# Patient Record
Sex: Female | Born: 1960 | Race: White | Hispanic: No | Smoking: Former smoker
Health system: Southern US, Community
[De-identification: ages and names within clinical notes are randomized; demographics above are authoritative.]

## PROBLEM LIST (undated history)

## (undated) DIAGNOSIS — M542 Cervicalgia: Secondary | ICD-10-CM

## (undated) DIAGNOSIS — M549 Dorsalgia, unspecified: Secondary | ICD-10-CM

## (undated) DIAGNOSIS — G4730 Sleep apnea, unspecified: Secondary | ICD-10-CM

## (undated) DIAGNOSIS — G8929 Other chronic pain: Secondary | ICD-10-CM

## (undated) DIAGNOSIS — E785 Hyperlipidemia, unspecified: Secondary | ICD-10-CM

## (undated) DIAGNOSIS — J309 Allergic rhinitis, unspecified: Secondary | ICD-10-CM

## (undated) DIAGNOSIS — R87629 Unspecified abnormal cytological findings in specimens from vagina: Secondary | ICD-10-CM

## (undated) DIAGNOSIS — I10 Essential (primary) hypertension: Secondary | ICD-10-CM

## (undated) DIAGNOSIS — J439 Emphysema, unspecified: Secondary | ICD-10-CM

## (undated) DIAGNOSIS — J449 Chronic obstructive pulmonary disease, unspecified: Secondary | ICD-10-CM

## (undated) DIAGNOSIS — R519 Headache, unspecified: Secondary | ICD-10-CM

## (undated) DIAGNOSIS — R51 Headache: Secondary | ICD-10-CM

## (undated) HISTORY — DX: Other chronic pain: G89.29

## (undated) HISTORY — DX: Chronic obstructive pulmonary disease, unspecified: J44.9

## (undated) HISTORY — DX: Hyperlipidemia, unspecified: E78.5

## (undated) HISTORY — DX: Emphysema, unspecified: J43.9

## (undated) HISTORY — DX: Allergic rhinitis, unspecified: J30.9

## (undated) HISTORY — DX: Essential (primary) hypertension: I10

## (undated) HISTORY — DX: Sleep apnea, unspecified: G47.30

## (undated) HISTORY — DX: Unspecified abnormal cytological findings in specimens from vagina: R87.629

## (undated) HISTORY — DX: Cervicalgia: M54.2

## (undated) HISTORY — PX: TONSILLECTOMY: SUR1361

## (undated) HISTORY — DX: Headache: R51

## (undated) HISTORY — PX: NECK SURGERY: SHX720

## (undated) HISTORY — DX: Headache, unspecified: R51.9

## (undated) HISTORY — PX: GYNECOLOGIC CRYOSURGERY: SHX857

---

## 2014-04-03 ENCOUNTER — Emergency Department (HOSPITAL_COMMUNITY)
Admission: EM | Admit: 2014-04-03 | Discharge: 2014-04-03 | Disposition: A | Discharge status: Home / Self Care | Payer: Worker's Compensation | Attending: Emergency Medicine | Admitting: Emergency Medicine

## 2014-04-03 ENCOUNTER — Encounter (HOSPITAL_COMMUNITY): Payer: Self-pay | Admitting: Emergency Medicine

## 2014-04-03 ENCOUNTER — Emergency Department (HOSPITAL_COMMUNITY): Payer: Worker's Compensation

## 2014-04-03 DIAGNOSIS — T2111XA Burn of first degree of chest wall, initial encounter: Secondary | ICD-10-CM | POA: Insufficient documentation

## 2014-04-03 DIAGNOSIS — Z880 Allergy status to penicillin: Secondary | ICD-10-CM | POA: Insufficient documentation

## 2014-04-03 DIAGNOSIS — Y93G3 Activity, cooking and baking: Secondary | ICD-10-CM | POA: Insufficient documentation

## 2014-04-03 DIAGNOSIS — Y990 Civilian activity done for income or pay: Secondary | ICD-10-CM | POA: Insufficient documentation

## 2014-04-03 DIAGNOSIS — X16XXXA Contact with hot heating appliances, radiators and pipes, initial encounter: Secondary | ICD-10-CM | POA: Diagnosis not present

## 2014-04-03 DIAGNOSIS — Z720 Tobacco use: Secondary | ICD-10-CM | POA: Diagnosis not present

## 2014-04-03 DIAGNOSIS — S0501XA Injury of conjunctiva and corneal abrasion without foreign body, right eye, initial encounter: Secondary | ICD-10-CM | POA: Insufficient documentation

## 2014-04-03 DIAGNOSIS — Y9289 Other specified places as the place of occurrence of the external cause: Secondary | ICD-10-CM | POA: Insufficient documentation

## 2014-04-03 DIAGNOSIS — T300 Burn of unspecified body region, unspecified degree: Secondary | ICD-10-CM

## 2014-04-03 DIAGNOSIS — Z23 Encounter for immunization: Secondary | ICD-10-CM | POA: Insufficient documentation

## 2014-04-03 DIAGNOSIS — T22131A Burn of first degree of right upper arm, initial encounter: Secondary | ICD-10-CM | POA: Diagnosis not present

## 2014-04-03 DIAGNOSIS — T2019XA Burn of first degree of multiple sites of head, face, and neck, initial encounter: Secondary | ICD-10-CM | POA: Diagnosis present

## 2014-04-03 DIAGNOSIS — T2017XA Burn of first degree of neck, initial encounter: Secondary | ICD-10-CM | POA: Insufficient documentation

## 2014-04-03 DIAGNOSIS — S0502XA Injury of conjunctiva and corneal abrasion without foreign body, left eye, initial encounter: Secondary | ICD-10-CM | POA: Insufficient documentation

## 2014-04-03 DIAGNOSIS — T2010XA Burn of first degree of head, face, and neck, unspecified site, initial encounter: Secondary | ICD-10-CM

## 2014-04-03 HISTORY — DX: Dorsalgia, unspecified: M54.9

## 2014-04-03 MED ORDER — FLUORESCEIN SODIUM 1 MG OP STRP
ORAL_STRIP | OPHTHALMIC | Status: AC
  Administered 2014-04-03: 1 via OPHTHALMIC
  Filled 2014-04-03: qty 1

## 2014-04-03 MED ORDER — OXYCODONE-ACETAMINOPHEN 5-325 MG PO TABS: 2.0000 | ORAL_TABLET | ORAL | Status: DC | PRN

## 2014-04-03 MED ORDER — TETRACAINE HCL 0.5 % OP SOLN
2.0000 [drp] | Freq: Once | OPHTHALMIC | Status: AC
  Administered 2014-04-03: 2 [drp] via OPHTHALMIC

## 2014-04-03 MED ORDER — SILVER SULFADIAZINE 1 % EX CREA: 1.0000 "application " | TOPICAL_CREAM | Freq: Every day | CUTANEOUS | Status: DC

## 2014-04-03 MED ORDER — POLYMYXIN B-TRIMETHOPRIM 10000-0.1 UNIT/ML-% OP SOLN: 1.0000 [drp] | OPHTHALMIC | Status: AC

## 2014-04-03 MED ORDER — KETOROLAC TROMETHAMINE 0.5 % OP SOLN: 1.0000 [drp] | Freq: Four times a day (QID) | OPHTHALMIC | Status: DC

## 2014-04-03 MED ORDER — FLUORESCEIN SODIUM 1 MG OP STRP
1.0000 | ORAL_STRIP | Freq: Once | OPHTHALMIC | Status: AC
  Administered 2014-04-03: 1 via OPHTHALMIC

## 2014-04-03 MED ORDER — TETRACAINE HCL 0.5 % OP SOLN
OPHTHALMIC | Status: AC
  Administered 2014-04-03: 2 [drp] via OPHTHALMIC
  Filled 2014-04-03: qty 2

## 2014-04-03 MED ORDER — HYDROMORPHONE HCL 1 MG/ML IJ SOLN
1.0000 mg | Freq: Once | INTRAMUSCULAR | Status: AC
  Administered 2014-04-03: 1 mg via INTRAMUSCULAR
  Filled 2014-04-03: qty 1

## 2014-04-03 MED ORDER — TETANUS-DIPHTH-ACELL PERTUSSIS 5-2.5-18.5 LF-MCG/0.5 IM SUSP
0.5000 mL | Freq: Once | INTRAMUSCULAR | Status: AC
  Administered 2014-04-03: 0.5 mL via INTRAMUSCULAR
  Filled 2014-04-03: qty 0.5

## 2014-04-03 NOTE — Discharge Instructions (Signed)
Burn Care Take percocet instead of vicodin.  Do not take both.  Follow up with Dr. Iona Hansen. Return to the ED if you develop new or worsening symptoms. Your skin is a natural barrier to infection. It is the largest organ of your body. Burns damage this natural protection. To help prevent infection, it is very important to follow your caregiver's instructions in the care of your burn. Burns are classified as:  First degree. There is only redness of the skin (erythema). No scarring is expected.  Second degree. There is blistering of the skin. Scarring may occur with deeper burns.  Third degree. All layers of the skin are injured, and scarring is expected. HOME CARE INSTRUCTIONS   Wash your hands well before changing your bandage.  Change your bandage as often as directed by your caregiver.  Remove the old bandage. If the bandage sticks, you may soak it off with cool, clean water.  Cleanse the burn thoroughly but gently with mild soap and water.  Pat the area dry with a clean, dry cloth.  Apply a thin layer of antibacterial cream to the burn.  Apply a clean bandage as instructed by your caregiver.  Keep the bandage as clean and dry as possible.  Elevate the affected area for the first 24 hours, then as instructed by your caregiver.  Only take over-the-counter or prescription medicines for pain, discomfort, or fever as directed by your caregiver. SEEK IMMEDIATE MEDICAL CARE IF:   You develop excessive pain.  You develop redness, tenderness, swelling, or red streaks near the burn.  The burned area develops yellowish-white fluid (pus) or a bad smell.  You have a fever. MAKE SURE YOU:   Understand these instructions.  Will watch your condition.  Will get help right away if you are not doing well or get worse. Document Released: 05/15/2005 Document Revised: 08/07/2011 Document Reviewed: 10/05/2010 [facility] Patient Information 2015 [place], [place]. This information is not  intended to replace advice given to you by your health care provider. Make sure you discuss any questions you have with your health care provider.

## 2014-04-03 NOTE — ED Notes (Signed)
MD at bedside. 

## 2014-04-03 NOTE — ED Notes (Signed)
Patient with no complaints at this time. Respirations even and unlabored. Skin warm/dry. Discharge instructions reviewed with patient at this time. Patient given opportunity to voice concerns/ask questions. Patient discharged at this time and left Emergency Department with steady gait.   
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
## 2015-01-07 HISTORY — PX: COLONOSCOPY: SHX174

## 2015-06-09 DIAGNOSIS — J0100 Acute maxillary sinusitis, unspecified: Secondary | ICD-10-CM | POA: Diagnosis not present

## 2015-06-12 DIAGNOSIS — G4731 Primary central sleep apnea: Secondary | ICD-10-CM | POA: Diagnosis not present

## 2015-06-14 DIAGNOSIS — G4731 Primary central sleep apnea: Secondary | ICD-10-CM | POA: Diagnosis not present

## 2015-06-14 DIAGNOSIS — R69 Illness, unspecified: Secondary | ICD-10-CM | POA: Diagnosis not present

## 2015-07-05 DIAGNOSIS — J329 Chronic sinusitis, unspecified: Secondary | ICD-10-CM | POA: Diagnosis not present

## 2015-07-13 DIAGNOSIS — G4731 Primary central sleep apnea: Secondary | ICD-10-CM | POA: Diagnosis not present

## 2015-08-03 DIAGNOSIS — J329 Chronic sinusitis, unspecified: Secondary | ICD-10-CM | POA: Diagnosis not present

## 2015-08-03 DIAGNOSIS — J328 Other chronic sinusitis: Secondary | ICD-10-CM | POA: Diagnosis not present

## 2015-08-10 DIAGNOSIS — G4731 Primary central sleep apnea: Secondary | ICD-10-CM | POA: Diagnosis not present

## 2015-08-16 DIAGNOSIS — G4489 Other headache syndrome: Secondary | ICD-10-CM | POA: Diagnosis not present

## 2015-08-19 DIAGNOSIS — G44209 Tension-type headache, unspecified, not intractable: Secondary | ICD-10-CM | POA: Diagnosis not present

## 2015-08-24 DIAGNOSIS — R69 Illness, unspecified: Secondary | ICD-10-CM | POA: Diagnosis not present

## 2015-08-24 DIAGNOSIS — G4731 Primary central sleep apnea: Secondary | ICD-10-CM | POA: Diagnosis not present

## 2015-08-24 DIAGNOSIS — G4710 Hypersomnia, unspecified: Secondary | ICD-10-CM | POA: Diagnosis not present

## 2015-09-02 DIAGNOSIS — M25512 Pain in left shoulder: Secondary | ICD-10-CM | POA: Diagnosis not present

## 2015-09-10 DIAGNOSIS — G4731 Primary central sleep apnea: Secondary | ICD-10-CM | POA: Diagnosis not present

## 2015-09-15 DIAGNOSIS — R030 Elevated blood-pressure reading, without diagnosis of hypertension: Secondary | ICD-10-CM | POA: Diagnosis not present

## 2015-09-15 DIAGNOSIS — G8929 Other chronic pain: Secondary | ICD-10-CM | POA: Diagnosis not present

## 2015-09-15 DIAGNOSIS — Z0000 Encounter for general adult medical examination without abnormal findings: Secondary | ICD-10-CM | POA: Diagnosis not present

## 2015-09-15 DIAGNOSIS — G4731 Primary central sleep apnea: Secondary | ICD-10-CM | POA: Diagnosis not present

## 2015-09-15 DIAGNOSIS — H02849 Edema of unspecified eye, unspecified eyelid: Secondary | ICD-10-CM | POA: Diagnosis not present

## 2015-09-15 DIAGNOSIS — M25512 Pain in left shoulder: Secondary | ICD-10-CM | POA: Diagnosis not present

## 2015-09-15 DIAGNOSIS — R942 Abnormal results of pulmonary function studies: Secondary | ICD-10-CM | POA: Diagnosis not present

## 2015-09-15 DIAGNOSIS — R69 Illness, unspecified: Secondary | ICD-10-CM | POA: Diagnosis not present

## 2015-09-27 DIAGNOSIS — M25512 Pain in left shoulder: Secondary | ICD-10-CM | POA: Diagnosis not present

## 2015-09-29 DIAGNOSIS — G4731 Primary central sleep apnea: Secondary | ICD-10-CM | POA: Diagnosis not present

## 2015-09-29 DIAGNOSIS — G4733 Obstructive sleep apnea (adult) (pediatric): Secondary | ICD-10-CM | POA: Diagnosis not present

## 2015-10-10 DIAGNOSIS — G4731 Primary central sleep apnea: Secondary | ICD-10-CM | POA: Diagnosis not present

## 2015-11-10 DIAGNOSIS — G4731 Primary central sleep apnea: Secondary | ICD-10-CM | POA: Diagnosis not present

## 2015-12-08 DIAGNOSIS — M5489 Other dorsalgia: Secondary | ICD-10-CM | POA: Diagnosis not present

## 2015-12-08 DIAGNOSIS — M961 Postlaminectomy syndrome, not elsewhere classified: Secondary | ICD-10-CM | POA: Diagnosis not present

## 2015-12-08 DIAGNOSIS — G894 Chronic pain syndrome: Secondary | ICD-10-CM | POA: Diagnosis not present

## 2015-12-08 DIAGNOSIS — M542 Cervicalgia: Secondary | ICD-10-CM | POA: Diagnosis not present

## 2015-12-10 DIAGNOSIS — G4731 Primary central sleep apnea: Secondary | ICD-10-CM | POA: Diagnosis not present

## 2015-12-15 DIAGNOSIS — M50221 Other cervical disc displacement at C4-C5 level: Secondary | ICD-10-CM | POA: Diagnosis not present

## 2015-12-15 DIAGNOSIS — M9971 Connective tissue and disc stenosis of intervertebral foramina of cervical region: Secondary | ICD-10-CM | POA: Diagnosis not present

## 2015-12-15 DIAGNOSIS — M5124 Other intervertebral disc displacement, thoracic region: Secondary | ICD-10-CM | POA: Diagnosis not present

## 2015-12-15 DIAGNOSIS — M47814 Spondylosis without myelopathy or radiculopathy, thoracic region: Secondary | ICD-10-CM | POA: Diagnosis not present

## 2015-12-15 DIAGNOSIS — M47812 Spondylosis without myelopathy or radiculopathy, cervical region: Secondary | ICD-10-CM | POA: Diagnosis not present

## 2015-12-15 DIAGNOSIS — Z981 Arthrodesis status: Secondary | ICD-10-CM | POA: Diagnosis not present

## 2015-12-23 DIAGNOSIS — M961 Postlaminectomy syndrome, not elsewhere classified: Secondary | ICD-10-CM | POA: Diagnosis not present

## 2015-12-23 DIAGNOSIS — M542 Cervicalgia: Secondary | ICD-10-CM | POA: Diagnosis not present

## 2015-12-23 DIAGNOSIS — M5134 Other intervertebral disc degeneration, thoracic region: Secondary | ICD-10-CM | POA: Diagnosis not present

## 2015-12-23 DIAGNOSIS — G894 Chronic pain syndrome: Secondary | ICD-10-CM | POA: Diagnosis not present

## 2016-01-04 DIAGNOSIS — M9973 Connective tissue and disc stenosis of intervertebral foramina of lumbar region: Secondary | ICD-10-CM | POA: Diagnosis not present

## 2016-01-10 DIAGNOSIS — G4731 Primary central sleep apnea: Secondary | ICD-10-CM | POA: Diagnosis not present

## 2016-01-24 DIAGNOSIS — M5489 Other dorsalgia: Secondary | ICD-10-CM | POA: Diagnosis not present

## 2016-01-24 DIAGNOSIS — M5134 Other intervertebral disc degeneration, thoracic region: Secondary | ICD-10-CM | POA: Diagnosis not present

## 2016-01-24 DIAGNOSIS — M961 Postlaminectomy syndrome, not elsewhere classified: Secondary | ICD-10-CM | POA: Diagnosis not present

## 2016-01-24 DIAGNOSIS — R51 Headache: Secondary | ICD-10-CM | POA: Diagnosis not present

## 2016-02-10 DIAGNOSIS — G4731 Primary central sleep apnea: Secondary | ICD-10-CM | POA: Diagnosis not present

## 2016-02-21 DIAGNOSIS — M542 Cervicalgia: Secondary | ICD-10-CM | POA: Diagnosis not present

## 2016-02-21 DIAGNOSIS — M5489 Other dorsalgia: Secondary | ICD-10-CM | POA: Diagnosis not present

## 2016-02-21 DIAGNOSIS — R51 Headache: Secondary | ICD-10-CM | POA: Diagnosis not present

## 2016-02-21 DIAGNOSIS — M5134 Other intervertebral disc degeneration, thoracic region: Secondary | ICD-10-CM | POA: Diagnosis not present

## 2016-02-27 DIAGNOSIS — J302 Other seasonal allergic rhinitis: Secondary | ICD-10-CM | POA: Diagnosis not present

## 2016-02-27 DIAGNOSIS — R6884 Jaw pain: Secondary | ICD-10-CM | POA: Diagnosis not present

## 2016-02-27 DIAGNOSIS — T148XXA Other injury of unspecified body region, initial encounter: Secondary | ICD-10-CM | POA: Diagnosis not present

## 2016-02-27 DIAGNOSIS — G8929 Other chronic pain: Secondary | ICD-10-CM | POA: Diagnosis not present

## 2016-03-11 DIAGNOSIS — G4731 Primary central sleep apnea: Secondary | ICD-10-CM | POA: Diagnosis not present

## 2016-03-13 DIAGNOSIS — J301 Allergic rhinitis due to pollen: Secondary | ICD-10-CM | POA: Diagnosis not present

## 2016-03-13 DIAGNOSIS — H1045 Other chronic allergic conjunctivitis: Secondary | ICD-10-CM | POA: Diagnosis not present

## 2016-03-13 DIAGNOSIS — J3089 Other allergic rhinitis: Secondary | ICD-10-CM | POA: Diagnosis not present

## 2016-03-13 DIAGNOSIS — R51 Headache: Secondary | ICD-10-CM | POA: Diagnosis not present

## 2016-03-13 DIAGNOSIS — J4530 Mild persistent asthma, uncomplicated: Secondary | ICD-10-CM | POA: Diagnosis not present

## 2016-03-13 DIAGNOSIS — R69 Illness, unspecified: Secondary | ICD-10-CM | POA: Diagnosis not present

## 2016-03-14 DIAGNOSIS — M5134 Other intervertebral disc degeneration, thoracic region: Secondary | ICD-10-CM | POA: Diagnosis not present

## 2016-03-14 DIAGNOSIS — M961 Postlaminectomy syndrome, not elsewhere classified: Secondary | ICD-10-CM | POA: Diagnosis not present

## 2016-03-14 DIAGNOSIS — M5489 Other dorsalgia: Secondary | ICD-10-CM | POA: Diagnosis not present

## 2016-03-14 DIAGNOSIS — R51 Headache: Secondary | ICD-10-CM | POA: Diagnosis not present

## 2016-03-17 DIAGNOSIS — J4530 Mild persistent asthma, uncomplicated: Secondary | ICD-10-CM | POA: Diagnosis not present

## 2016-03-27 DIAGNOSIS — M542 Cervicalgia: Secondary | ICD-10-CM | POA: Diagnosis not present

## 2016-03-27 DIAGNOSIS — M5134 Other intervertebral disc degeneration, thoracic region: Secondary | ICD-10-CM | POA: Diagnosis not present

## 2016-03-27 DIAGNOSIS — R51 Headache: Secondary | ICD-10-CM | POA: Diagnosis not present

## 2016-03-27 DIAGNOSIS — M5489 Other dorsalgia: Secondary | ICD-10-CM | POA: Diagnosis not present

## 2016-04-11 DIAGNOSIS — G4731 Primary central sleep apnea: Secondary | ICD-10-CM | POA: Diagnosis not present

## 2016-04-12 DIAGNOSIS — M47814 Spondylosis without myelopathy or radiculopathy, thoracic region: Secondary | ICD-10-CM | POA: Diagnosis not present

## 2016-04-12 DIAGNOSIS — M5134 Other intervertebral disc degeneration, thoracic region: Secondary | ICD-10-CM | POA: Diagnosis not present

## 2016-04-24 DIAGNOSIS — M47812 Spondylosis without myelopathy or radiculopathy, cervical region: Secondary | ICD-10-CM | POA: Diagnosis not present

## 2016-04-26 DIAGNOSIS — J301 Allergic rhinitis due to pollen: Secondary | ICD-10-CM | POA: Diagnosis not present

## 2016-04-26 DIAGNOSIS — R51 Headache: Secondary | ICD-10-CM | POA: Diagnosis not present

## 2016-04-26 DIAGNOSIS — J3089 Other allergic rhinitis: Secondary | ICD-10-CM | POA: Diagnosis not present

## 2016-04-26 DIAGNOSIS — R69 Illness, unspecified: Secondary | ICD-10-CM | POA: Diagnosis not present

## 2016-04-26 DIAGNOSIS — J4530 Mild persistent asthma, uncomplicated: Secondary | ICD-10-CM | POA: Diagnosis not present

## 2016-04-26 DIAGNOSIS — H1045 Other chronic allergic conjunctivitis: Secondary | ICD-10-CM | POA: Diagnosis not present

## 2016-05-24 DIAGNOSIS — Z79891 Long term (current) use of opiate analgesic: Secondary | ICD-10-CM | POA: Diagnosis not present

## 2016-05-24 DIAGNOSIS — R51 Headache: Secondary | ICD-10-CM | POA: Diagnosis not present

## 2016-05-24 DIAGNOSIS — M47812 Spondylosis without myelopathy or radiculopathy, cervical region: Secondary | ICD-10-CM | POA: Diagnosis not present

## 2016-05-24 DIAGNOSIS — M5489 Other dorsalgia: Secondary | ICD-10-CM | POA: Diagnosis not present

## 2016-05-24 DIAGNOSIS — Z79899 Other long term (current) drug therapy: Secondary | ICD-10-CM | POA: Diagnosis not present

## 2016-05-24 DIAGNOSIS — G894 Chronic pain syndrome: Secondary | ICD-10-CM | POA: Diagnosis not present

## 2016-05-24 DIAGNOSIS — M47814 Spondylosis without myelopathy or radiculopathy, thoracic region: Secondary | ICD-10-CM | POA: Diagnosis not present

## 2016-06-01 DIAGNOSIS — G8921 Chronic pain due to trauma: Secondary | ICD-10-CM | POA: Diagnosis not present

## 2016-06-01 DIAGNOSIS — R69 Illness, unspecified: Secondary | ICD-10-CM | POA: Diagnosis not present

## 2016-06-01 DIAGNOSIS — F411 Generalized anxiety disorder: Secondary | ICD-10-CM | POA: Diagnosis not present

## 2016-06-20 DIAGNOSIS — M47812 Spondylosis without myelopathy or radiculopathy, cervical region: Secondary | ICD-10-CM | POA: Diagnosis not present

## 2016-06-20 DIAGNOSIS — G894 Chronic pain syndrome: Secondary | ICD-10-CM | POA: Diagnosis not present

## 2016-06-20 DIAGNOSIS — Z79899 Other long term (current) drug therapy: Secondary | ICD-10-CM | POA: Diagnosis not present

## 2016-06-20 DIAGNOSIS — M47814 Spondylosis without myelopathy or radiculopathy, thoracic region: Secondary | ICD-10-CM | POA: Diagnosis not present

## 2016-06-20 DIAGNOSIS — M961 Postlaminectomy syndrome, not elsewhere classified: Secondary | ICD-10-CM | POA: Diagnosis not present

## 2016-06-20 DIAGNOSIS — Z79891 Long term (current) use of opiate analgesic: Secondary | ICD-10-CM | POA: Diagnosis not present

## 2016-06-23 DIAGNOSIS — F411 Generalized anxiety disorder: Secondary | ICD-10-CM | POA: Diagnosis not present

## 2016-06-23 DIAGNOSIS — R69 Illness, unspecified: Secondary | ICD-10-CM | POA: Diagnosis not present

## 2016-06-23 DIAGNOSIS — G8921 Chronic pain due to trauma: Secondary | ICD-10-CM | POA: Diagnosis not present

## 2016-07-11 ENCOUNTER — Ambulatory Visit (INDEPENDENT_AMBULATORY_CARE_PROVIDER_SITE_OTHER): Payer: Medicare HMO | Admitting: Family Medicine

## 2016-07-11 ENCOUNTER — Ambulatory Visit (INDEPENDENT_AMBULATORY_CARE_PROVIDER_SITE_OTHER): Payer: Medicare HMO

## 2016-07-11 ENCOUNTER — Encounter: Payer: Self-pay | Admitting: Family Medicine

## 2016-07-11 VITALS — BP 132/80 | HR 81 | Temp 99.0°F | Ht 65.0 in | Wt 150.0 lb

## 2016-07-11 DIAGNOSIS — E782 Mixed hyperlipidemia: Secondary | ICD-10-CM | POA: Diagnosis not present

## 2016-07-11 DIAGNOSIS — J441 Chronic obstructive pulmonary disease with (acute) exacerbation: Secondary | ICD-10-CM

## 2016-07-11 DIAGNOSIS — R05 Cough: Secondary | ICD-10-CM

## 2016-07-11 DIAGNOSIS — R635 Abnormal weight gain: Secondary | ICD-10-CM

## 2016-07-11 DIAGNOSIS — G4489 Other headache syndrome: Secondary | ICD-10-CM

## 2016-07-11 DIAGNOSIS — R059 Cough, unspecified: Secondary | ICD-10-CM

## 2016-07-11 MED ORDER — PREDNISONE 10 MG PO TABS: ORAL_TABLET | ORAL | 0 refills | Status: DC

## 2016-07-11 MED ORDER — CLARITHROMYCIN 500 MG PO TABS: 500.0000 mg | ORAL_TABLET | Freq: Two times a day (BID) | ORAL | 0 refills | Status: DC

## 2016-07-11 NOTE — Progress Notes (Signed)
Subjective:  Patient ID: Sarah Chapman, female    DOB: 01/02/1961  Age: 56 y.o. MRN: [id number]  CC: New Patient (Initial Visit) (pt here today c/o chest congestion, cough for the past 2 weeks)   HPI Sarah Chapman presents for New patient seen for 2 weeks of dry cough and chest congestion. She says she is very weak. She's also had also having headache. This is located in both temples. She describes the pain as dull. 4/10 severity. Intermittent. She has been smoking one pack of cigarettes daily. Over the last 40 years she's quit a couple of times. Probably smoking about 30 years out of the last 80. She does not have significant shortness of breath. Nor does she have wheezing. She does have a history of chronic headache in addition to the acute headache described above. She has significant trouble with daytime drowsiness for which she takes Nuvigil. She has a been a former user of fentanyl for pain. Currently she is only taking hydrocodone. She is managed in pain clinic for this. Pain is based on neck and thoracic back pain. This waxes and wanes in the moderate levels. She has had multiple surgeries.  Patient is concerned about a 30 pound weight gain over the last 18 months. She has not changed her appetite is or activities. Concerned about medical conditions as a cause. She would like to have some blood work done related to that today.  History Babbie has a past medical history of Back pain.   She has no past surgical history on file.   Her family history is not on file.She reports that she has been smoking Cigarettes.  She has been smoking about 1.00 pack per day. She has never used smokeless tobacco. She reports that she does not drink alcohol or use drugs.  Current Outpatient Prescriptions on File Prior to Visit  Medication Sig Dispense Refill  . ALPRAZolam (XANAX) 0.5 MG tablet Take 1 tablet by mouth 2 (two) times daily.  2  . HYDROcodone-acetaminophen (NORCO) 10-325 MG per tablet Take 1-2 tablets  by mouth every 6 (six) hours as needed for moderate pain.   0  . ketorolac (ACULAR) 0.5 % ophthalmic solution Place 1 drop into both eyes every 6 (six) hours. 3 mL 0  . loratadine (CLARITIN) 10 MG tablet Take 10 mg by mouth daily as needed for allergies.    Marland Kitchen NUVIGIL 250 MG tablet Take 250 mg by mouth daily.  2   No current facility-administered medications on file prior to visit.     ROS Review of Systems  Constitutional: Positive for unexpected weight change. Negative for activity change, appetite change and fever.  HENT: Positive for congestion. Negative for rhinorrhea and sore throat.   Eyes: Negative for visual disturbance.  Respiratory: Positive for cough. Negative for shortness of breath.   Cardiovascular: Negative for chest pain and palpitations.  Gastrointestinal: Negative for abdominal pain, diarrhea and nausea.  Genitourinary: Negative for dysuria.  Musculoskeletal: Positive for arthralgias, back pain and neck pain. Negative for myalgias.  Neurological: Positive for headaches.  Psychiatric/Behavioral: Positive for dysphoric mood and sleep disturbance.    Objective:  BP 132/80   Pulse 81   Temp 99 F (37.2 C) (Oral)   Ht _0  (1.651 m)   Wt 150 lb (68 kg)   BMI 24.96 kg/m   Physical Exam  Constitutional: She is oriented to person, place, and time. She appears well-developed and well-nourished. No distress.  HENT:  Head: Normocephalic and  atraumatic.  Right Ear: External ear normal.  Left Ear: External ear normal.  Nose: Nose normal.  Mouth/Throat: Oropharynx is clear and moist.  Eyes: Conjunctivae and EOM are normal. Pupils are equal, round, and reactive to light.  Neck: Normal range of motion. Neck supple. No thyromegaly present.  Cardiovascular: Normal rate, regular rhythm and normal heart sounds.   No murmur heard. Pulmonary/Chest: Effort normal. No respiratory distress. She has wheezes (diffuse scattered). She has no rales.  Abdominal: Soft. Bowel sounds  are normal. She exhibits no distension. There is no tenderness.  Lymphadenopathy:    She has no cervical adenopathy.  Neurological: She is alert and oriented to person, place, and time. She has normal reflexes.  Skin: Skin is warm and dry.  Psychiatric: She has a normal mood and affect. Her behavior is normal. Judgment and thought content normal.    Assessment & Plan:   Sharlot was seen today for new patient (initial visit).  Diagnoses and all orders for this visit:  Acute exacerbation of chronic obstructive pulmonary disease (COPD) (HCC)  Cough -     CBC with Differential/Platelet -     CMP14+EGFR -     DG Chest 2 View; Future -     TSH -     Urinalysis  Other headache syndrome -     CBC with Differential/Platelet -     CMP14+EGFR -     TSH -     Urinalysis  Weight gain, abnormal -     CBC with Differential/Platelet -     CMP14+EGFR -     TSH -     Urinalysis  Mixed hyperlipidemia -     Lipid panel  Other orders -     clarithromycin (BIAXIN) 500 MG tablet; Take 1 tablet (500 mg total) by mouth 2 (two) times daily. -     predniSONE (DELTASONE) 10 MG tablet; Take 5 daily for 2 days followed by 4,3,2 and 1 for 2 days each.   I have discontinued Ms. Hollander's fentaNYL, FLUoxetine, oxyCODONE-acetaminophen, and silver sulfADIAZINE. I am also having her start on clarithromycin and predniSONE. Additionally, I am having her maintain her HYDROcodone-acetaminophen, NUVIGIL, ALPRAZolam, loratadine, ketorolac, azelastine, COMBIVENT RESPIMAT, and nortriptyline.  Meds ordered this encounter  Medications  . azelastine (OPTIVAR) 0.05 % ophthalmic solution    Sig: PLACE 1 DROP INTO BOTH EYES EVERY 12 HOURS AS NEEDED FOR UP TO 30 DAYS (ITCHY OR REDDENED EYES)    Refill:  5  . COMBIVENT RESPIMAT 20-100 MCG/ACT AERS respimat    Sig: INHALE ONE PUFF INTO THE LUNGS EVERY 6 (SIX) HOURS AS NEEDED FOR WHEEZING.    Refill:  0  . nortriptyline (PAMELOR) 10 MG capsule    Sig: Take 10 mg by mouth  3 (three) times daily.    Refill:  1  . clarithromycin (BIAXIN) 500 MG tablet    Sig: Take 1 tablet (500 mg total) by mouth 2 (two) times daily.    Dispense:  20 tablet    Refill:  0  . predniSONE (DELTASONE) 10 MG tablet    Sig: Take 5 daily for 2 days followed by 4,3,2 and 1 for 2 days each.    Dispense:  30 tablet    Refill:  0     Follow-up: Return in about 1 month (around 08/08/2016).  Claretta Fraise, M.D.

## 2016-07-12 DIAGNOSIS — M542 Cervicalgia: Secondary | ICD-10-CM | POA: Diagnosis not present

## 2016-07-12 DIAGNOSIS — M47812 Spondylosis without myelopathy or radiculopathy, cervical region: Secondary | ICD-10-CM | POA: Diagnosis not present

## 2016-07-12 DIAGNOSIS — G894 Chronic pain syndrome: Secondary | ICD-10-CM | POA: Diagnosis not present

## 2016-07-12 DIAGNOSIS — M961 Postlaminectomy syndrome, not elsewhere classified: Secondary | ICD-10-CM | POA: Diagnosis not present

## 2016-07-12 DIAGNOSIS — M47814 Spondylosis without myelopathy or radiculopathy, thoracic region: Secondary | ICD-10-CM | POA: Diagnosis not present

## 2016-07-14 ENCOUNTER — Telehealth: Payer: Self-pay | Admitting: Family Medicine

## 2016-07-14 NOTE — Telephone Encounter (Signed)
Faxed release to [phone]

## 2016-07-18 ENCOUNTER — Telehealth: Payer: Self-pay | Admitting: Family Medicine

## 2016-07-18 DIAGNOSIS — G4489 Other headache syndrome: Secondary | ICD-10-CM

## 2016-07-18 DIAGNOSIS — R635 Abnormal weight gain: Secondary | ICD-10-CM

## 2016-07-18 DIAGNOSIS — R059 Cough, unspecified: Secondary | ICD-10-CM

## 2016-07-18 DIAGNOSIS — R05 Cough: Secondary | ICD-10-CM

## 2016-07-18 DIAGNOSIS — J441 Chronic obstructive pulmonary disease with (acute) exacerbation: Secondary | ICD-10-CM

## 2016-07-18 DIAGNOSIS — E782 Mixed hyperlipidemia: Secondary | ICD-10-CM

## 2016-07-18 NOTE — Telephone Encounter (Signed)
Spoke with patient and advised her she was supposed to have labwork done at her visit.  I placed future orders for CBC, CMP, TSH, and Lipid panel per Dr. Livia Snellen office visit notes

## 2016-07-19 ENCOUNTER — Other Ambulatory Visit: Payer: Medicare HMO

## 2016-07-19 DIAGNOSIS — J441 Chronic obstructive pulmonary disease with (acute) exacerbation: Secondary | ICD-10-CM | POA: Diagnosis not present

## 2016-07-19 DIAGNOSIS — G4489 Other headache syndrome: Secondary | ICD-10-CM | POA: Diagnosis not present

## 2016-07-19 DIAGNOSIS — R05 Cough: Secondary | ICD-10-CM

## 2016-07-19 DIAGNOSIS — E782 Mixed hyperlipidemia: Secondary | ICD-10-CM | POA: Diagnosis not present

## 2016-07-19 DIAGNOSIS — R635 Abnormal weight gain: Secondary | ICD-10-CM | POA: Diagnosis not present

## 2016-07-19 DIAGNOSIS — R059 Cough, unspecified: Secondary | ICD-10-CM

## 2016-07-20 LAB — CBC WITH DIFFERENTIAL/PLATELET
BASOS: 0 %
Basophils Absolute: 0 10*3/uL (ref 0.0–0.2)
EOS (ABSOLUTE): 0.1 10*3/uL (ref 0.0–0.4)
Eos: 1 %
Hematocrit: 43.1 % (ref 34.0–46.6)
Hemoglobin: 14.4 g/dL (ref 11.1–15.9)
IMMATURE GRANS (ABS): 0.1 10*3/uL (ref 0.0–0.1)
Immature Granulocytes: 1 %
Lymphocytes Absolute: 2.3 10*3/uL (ref 0.7–3.1)
Lymphs: 14 %
MCH: 32.2 pg (ref 26.6–33.0)
MCHC: 33.4 g/dL (ref 31.5–35.7)
MCV: 96 fL (ref 79–97)
MONOS ABS: 1.1 10*3/uL — AB (ref 0.1–0.9)
Monocytes: 7 %
NEUTROS ABS: 12.6 10*3/uL — AB (ref 1.4–7.0)
Neutrophils: 77 %
PLATELETS: 426 10*3/uL — AB (ref 150–379)
RBC: 4.47 x10E6/uL (ref 3.77–5.28)
RDW: 13.9 % (ref 12.3–15.4)
WBC: 16.2 10*3/uL — ABNORMAL HIGH (ref 3.4–10.8)

## 2016-07-20 LAB — CMP14+EGFR
ALT: 34 IU/L — AB (ref 0–32)
AST: 16 IU/L (ref 0–40)
Albumin/Globulin Ratio: 1.8 (ref 1.2–2.2)
Albumin: 4.4 g/dL (ref 3.5–5.5)
Alkaline Phosphatase: 96 IU/L (ref 39–117)
BILIRUBIN TOTAL: 0.3 mg/dL (ref 0.0–1.2)
BUN/Creatinine Ratio: 20 (ref 9–23)
BUN: 15 mg/dL (ref 6–24)
CHLORIDE: 98 mmol/L (ref 96–106)
CO2: 26 mmol/L (ref 18–29)
Calcium: 9.6 mg/dL (ref 8.7–10.2)
Creatinine, Ser: 0.75 mg/dL (ref 0.57–1.00)
GFR calc non Af Amer: 89 (ref >59)
GFR, EST AFRICAN AMERICAN: 102 (ref >59)
GLOBULIN, TOTAL: 2.5 (ref 1.5–4.5)
Glucose: 88 mg/dL (ref 65–99)
Potassium: 4.8 mmol/L (ref 3.5–5.2)
SODIUM: 141 mmol/L (ref 134–144)
Total Protein: 6.9 g/dL (ref 6.0–8.5)

## 2016-07-20 LAB — THYROID PANEL WITH TSH
Free Thyroxine Index: 1.8 (ref 1.2–4.9)
T3 UPTAKE RATIO: 27 % (ref 24–39)
T4, Total: 6.7 ug/dL (ref 4.5–12.0)
TSH: 2.44 u[IU]/mL (ref 0.450–4.500)

## 2016-07-20 LAB — LIPID PANEL
Chol/HDL Ratio: 2.1 (ref 0.0–4.4)
Cholesterol, Total: 246 mg/dL — ABNORMAL HIGH (ref 100–199)
HDL: 115 mg/dL (ref >39)
LDL Calculated: 101 — ABNORMAL HIGH (ref 0–99)
TRIGLYCERIDES: 152 mg/dL — AB (ref 0–149)
VLDL Cholesterol Cal: 30 (ref 5–40)

## 2016-07-21 DIAGNOSIS — F411 Generalized anxiety disorder: Secondary | ICD-10-CM | POA: Diagnosis not present

## 2016-07-21 DIAGNOSIS — R69 Illness, unspecified: Secondary | ICD-10-CM | POA: Diagnosis not present

## 2016-07-21 DIAGNOSIS — G8921 Chronic pain due to trauma: Secondary | ICD-10-CM | POA: Diagnosis not present

## 2016-07-25 ENCOUNTER — Encounter: Payer: Self-pay | Admitting: Family Medicine

## 2016-07-25 ENCOUNTER — Ambulatory Visit (INDEPENDENT_AMBULATORY_CARE_PROVIDER_SITE_OTHER): Payer: Medicare HMO | Admitting: Family Medicine

## 2016-07-25 VITALS — BP 133/85 | HR 93 | Temp 99.2°F | Ht 65.0 in | Wt 148.0 lb

## 2016-07-25 DIAGNOSIS — L03113 Cellulitis of right upper limb: Secondary | ICD-10-CM

## 2016-07-25 DIAGNOSIS — B370 Candidal stomatitis: Secondary | ICD-10-CM

## 2016-07-25 DIAGNOSIS — Z716 Tobacco abuse counseling: Secondary | ICD-10-CM | POA: Diagnosis not present

## 2016-07-25 DIAGNOSIS — J439 Emphysema, unspecified: Secondary | ICD-10-CM | POA: Diagnosis not present

## 2016-07-25 DIAGNOSIS — F411 Generalized anxiety disorder: Secondary | ICD-10-CM

## 2016-07-25 DIAGNOSIS — R69 Illness, unspecified: Secondary | ICD-10-CM | POA: Diagnosis not present

## 2016-07-25 MED ORDER — CIPROFLOXACIN HCL 500 MG PO TABS: 500.0000 mg | ORAL_TABLET | Freq: Two times a day (BID) | ORAL | 0 refills | Status: DC

## 2016-07-25 MED ORDER — CLOTRIMAZOLE 10 MG MT TROC: OROMUCOSAL | 0 refills | Status: DC
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
## 2016-08-09 ENCOUNTER — Ambulatory Visit (INDEPENDENT_AMBULATORY_CARE_PROVIDER_SITE_OTHER): Payer: Medicare HMO | Admitting: Family Medicine

## 2016-08-09 ENCOUNTER — Encounter: Payer: Self-pay | Admitting: Family Medicine

## 2016-08-09 VITALS — BP 161/97 | HR 86 | Ht 65.0 in | Wt 149.0 lb

## 2016-08-09 DIAGNOSIS — Z716 Tobacco abuse counseling: Secondary | ICD-10-CM | POA: Diagnosis not present

## 2016-08-09 DIAGNOSIS — M542 Cervicalgia: Secondary | ICD-10-CM

## 2016-08-09 DIAGNOSIS — I10 Essential (primary) hypertension: Secondary | ICD-10-CM | POA: Diagnosis not present

## 2016-08-09 DIAGNOSIS — R69 Illness, unspecified: Secondary | ICD-10-CM | POA: Diagnosis not present

## 2016-08-09 DIAGNOSIS — F411 Generalized anxiety disorder: Secondary | ICD-10-CM | POA: Diagnosis not present

## 2016-08-09 DIAGNOSIS — J410 Simple chronic bronchitis: Secondary | ICD-10-CM | POA: Diagnosis not present

## 2016-08-09 MED ORDER — FLUTICASONE-SALMETEROL 250-50 MCG/DOSE IN AEPB: 1.0000 | INHALATION_SPRAY | Freq: Two times a day (BID) | RESPIRATORY_TRACT | 11 refills | Status: DC

## 2016-08-09 MED ORDER — VALSARTAN 160 MG PO TABS: 160.0000 mg | ORAL_TABLET | Freq: Every day | ORAL | 3 refills | Status: DC

## 2016-08-09 MED ORDER — PREDNISONE 10 MG PO TABS: ORAL_TABLET | ORAL | 0 refills | Status: DC
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
## 2016-08-17 ENCOUNTER — Ambulatory Visit (INDEPENDENT_AMBULATORY_CARE_PROVIDER_SITE_OTHER): Payer: Medicare HMO

## 2016-08-17 ENCOUNTER — Telehealth (HOSPITAL_COMMUNITY): Payer: Self-pay | Admitting: *Deleted

## 2016-08-17 ENCOUNTER — Other Ambulatory Visit: Payer: Self-pay | Admitting: Physician Assistant

## 2016-08-17 DIAGNOSIS — Z79899 Other long term (current) drug therapy: Secondary | ICD-10-CM | POA: Diagnosis not present

## 2016-08-17 DIAGNOSIS — M47812 Spondylosis without myelopathy or radiculopathy, cervical region: Secondary | ICD-10-CM

## 2016-08-17 DIAGNOSIS — M5489 Other dorsalgia: Secondary | ICD-10-CM | POA: Diagnosis not present

## 2016-08-17 DIAGNOSIS — M47814 Spondylosis without myelopathy or radiculopathy, thoracic region: Secondary | ICD-10-CM | POA: Diagnosis not present

## 2016-08-17 DIAGNOSIS — M961 Postlaminectomy syndrome, not elsewhere classified: Secondary | ICD-10-CM | POA: Diagnosis not present

## 2016-08-17 DIAGNOSIS — F411 Generalized anxiety disorder: Secondary | ICD-10-CM | POA: Diagnosis not present

## 2016-08-17 DIAGNOSIS — G894 Chronic pain syndrome: Secondary | ICD-10-CM | POA: Diagnosis not present

## 2016-08-17 DIAGNOSIS — Z79891 Long term (current) use of opiate analgesic: Secondary | ICD-10-CM | POA: Diagnosis not present

## 2016-08-17 DIAGNOSIS — M542 Cervicalgia: Secondary | ICD-10-CM | POA: Diagnosis not present

## 2016-08-17 DIAGNOSIS — R69 Illness, unspecified: Secondary | ICD-10-CM | POA: Diagnosis not present

## 2016-08-17 DIAGNOSIS — G8921 Chronic pain due to trauma: Secondary | ICD-10-CM | POA: Diagnosis not present

## 2016-08-17 NOTE — Telephone Encounter (Signed)
Left voice message regarding an appointment. 

## 2016-08-18 DIAGNOSIS — G4731 Primary central sleep apnea: Secondary | ICD-10-CM | POA: Diagnosis not present

## 2016-08-18 DIAGNOSIS — G4733 Obstructive sleep apnea (adult) (pediatric): Secondary | ICD-10-CM | POA: Diagnosis not present

## 2016-08-22 ENCOUNTER — Encounter: Payer: Self-pay | Admitting: Pulmonary Disease

## 2016-08-22 ENCOUNTER — Other Ambulatory Visit: Payer: Worker's Compensation

## 2016-08-22 ENCOUNTER — Ambulatory Visit (INDEPENDENT_AMBULATORY_CARE_PROVIDER_SITE_OTHER): Payer: Medicare HMO | Admitting: Pulmonary Disease

## 2016-08-22 VITALS — BP 150/82 | HR 90 | Ht 65.0 in | Wt 153.4 lb

## 2016-08-22 DIAGNOSIS — J441 Chronic obstructive pulmonary disease with (acute) exacerbation: Secondary | ICD-10-CM

## 2016-08-22 NOTE — Progress Notes (Signed)
Sarah Chapman    [id number]    16-Dec-1960  Primary Care Physician:STACKS,WARREN, MD  Referring Physician: Claretta Fraise, MD [place], [place] [address]  Chief complaint:  Consult for management of COPD GOLD D (CAT score 13, several exacerbations over past year)  HPI: Mrs. Sarah Chapman is a 56 year old with history of COPD. She has been diagnosed in 2016 with PFTs that showed mild to moderate obstruction. She has been maintained on Advair but stopped taking this 1 month ago because of thrush.. She reports daily symptoms of dyspnea on exertion, cough with sputum production. She also saw al allergist in March 2018, diagnosed with allergies, asthma and was given astelin, fluticasone nasal sprays  She has about 25-pack-year smoking history and quit 1 month ago using Chantix but still has the urge to smoke. 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 She was diagnosed with a oibstructive sleep apnea in 2016 and is maintained on CPAP. She follows up with the sleep doctor at [facility] cannot remember the name]  Outpatient Encounter Prescriptions as of 08/22/2016  Medication Sig  . ALPRAZolam (XANAX) 0.5 MG tablet Take 1 tablet by mouth 2 (two) times daily.  Marland Kitchen azelastine (OPTIVAR) 0.05 % ophthalmic solution PLACE 1 DROP INTO BOTH EYES EVERY 12 HOURS AS NEEDED FOR UP TO 30 DAYS (ITCHY OR REDDENED EYES)  . escitalopram (LEXAPRO) 20 MG tablet   . HYDROcodone-acetaminophen (NORCO) 10-325 MG per tablet Take 1-2 tablets by mouth every 6 (six) hours as needed for moderate pain.   Marland Kitchen loratadine (CLARITIN) 10 MG tablet Take 10 mg by mouth daily as needed for allergies.  Marland Kitchen nortriptyline (PAMELOR) 10 MG capsule Take 10 mg by mouth 3 (three) times daily.  Marland Kitchen NUVIGIL 250 MG tablet Take 250 mg by mouth daily.  . valsartan (DIOVAN) 160 MG tablet Take 1 tablet (160 mg total) by mouth daily. For Blood pressure  . [DISCONTINUED] clotrimazole  (MYCELEX) 10 MG troche Allow one to dissolve in the mouth 5 times daily For yeast  . [DISCONTINUED] predniSONE (DELTASONE) 10 MG tablet Take 5 daily for 2 days followed by 4,3,2 and 1 for 2 days each.  . COMBIVENT RESPIMAT 20-100 MCG/ACT AERS respimat INHALE ONE PUFF INTO THE LUNGS EVERY 6 (SIX) HOURS AS NEEDED FOR WHEEZING.  Marland Kitchen Fluticasone-Salmeterol (ADVAIR DISKUS) 250-50 MCG/DOSE AEPB Inhale 1 puff into the lungs 2 (two) times daily. (Patient not taking: Reported on 08/22/2016)   No facility-administered encounter medications on file as of 08/22/2016.     Allergies as of 08/22/2016 - Review Complete 08/22/2016  Allergen Reaction Noted  . Clindamycin/lincomycin Rash 11/27/2012  . Penicillins  04/03/2014    Past Medical History:  Diagnosis Date  . Allergic rhinitis   . Back pain   . Chronic headache   . COPD (chronic obstructive pulmonary disease) ([place])   . Emphysema of lung ([place])   . Hyperlipidemia   . Hypertension   . Neck pain   . Sleep apnea     Past Surgical History:  Procedure Laterality Date  . NECK SURGERY    . TONSILLECTOMY      Family History  Problem Relation Age of Onset  . Bone cancer Mother     Social History   Social History  . Marital status: Single    Spouse name: N/A  . Number of children: N/A  . Years of education: N/A  Occupational History  . Not on file.   Social History Main Topics  . Smoking status: Former Smoker    Packs/day: 1.00    Types: Cigarettes    Quit date: 07/25/2016  . Smokeless tobacco: Never Used  . Alcohol use No  . Drug use: No  . Sexual activity: Not on file   Other Topics Concern  . Not on file   Social History Narrative  . No narrative on file    Review of systems: Review of Systems  Constitutional: Negative for fever and chills.  HENT: Negative.   Eyes: Negative for blurred vision.  Respiratory: as per HPI  Cardiovascular: Negative for chest pain and palpitations.  Gastrointestinal: Negative for vomiting,  diarrhea, blood per rectum. Genitourinary: Negative for dysuria, urgency, frequency and hematuria.  Musculoskeletal: Negative for myalgias, back pain and joint pain.  Skin: Negative for itching and rash.  Neurological: Negative for dizziness, tremors, focal weakness, seizures and loss of consciousness.  Endo/Heme/Allergies: Negative for environmental allergies.  Psychiatric/Behavioral: Negative for depression, suicidal ideas and hallucinations.  All other systems reviewed and are negative.  Physical Exam: Blood pressure (!) 150/82, pulse 90, height 5\' 5"  (1.651 m), weight 153 lb 6.4 oz (69.6 kg), SpO2 99 %. Gen:      No acute distress HEENT:  EOMI, sclera anicteric Neck:     No masses; no thyromegaly Lungs:    Clear to auscultation bilaterally; normal respiratory effort CV:         Regular rate and rhythm; no murmurs Abd:      + bowel sounds; soft, non-tender; no palpable masses, no distension Ext:    No edema; adequate peripheral perfusion Skin:      Warm and dry; no rash Neuro: alert and oriented x 3 Psych: normal mood and affect  Data Reviewed: PFTs 04/16/15 Mild to moderate obstruction with reduction in expiratory flow post inhaled bronchodilator. Normal lung volumes and diffusing capacity.  Sleep study 03/02/15 RDI 24.9 Sat 87% Average Sat 91%   Chest x-ray 07/11/18-hyperinflation consistent with COPD. No lung infiltrate. I reviewed the images personally.  Assessment:  COPD GOLD D  She stopped using the Advair last month due to thrush. I do not see any evidence of this on examination today. I have asked her to restart the Advair and make sure that she rinses her mouth daily. She will continue on combivent PRN as well. She may need to be on additional inhalers containing LAMA but we'll reevaluate this at return visit. She'll get pulmonary function test and alpha 1 antitrypsin levels, phenotype for further evaluation  Ex-smoker Encouraged to keep of cigarettes. She will be  referred for lung cancer screening program  OSA Stable on CPAP  Plan/Recommendations: - Resume advair. Continue combivent PRN - Check PFTs, A1AT levels and phenotype - Low dose cancer screening  Marshell Garfinkel MD [facility] Pager [phone] 08/22/2016, 12:21 PM  CC: Claretta Fraise, MD

## 2016-08-22 NOTE — Patient Instructions (Signed)
We will check A1At levels and phenotype. Refer for lung cancer screening  Continue using the advair. Remember to rinse your mouth after use Continue using the combivent PRN  Return in 3 months with PFTs on day of return

## 2016-08-23 DIAGNOSIS — M545 Low back pain: Secondary | ICD-10-CM | POA: Diagnosis not present

## 2016-08-23 DIAGNOSIS — M5417 Radiculopathy, lumbosacral region: Secondary | ICD-10-CM | POA: Diagnosis not present

## 2016-08-23 DIAGNOSIS — M5136 Other intervertebral disc degeneration, lumbar region: Secondary | ICD-10-CM | POA: Diagnosis not present

## 2016-08-29 ENCOUNTER — Telehealth: Payer: Self-pay | Admitting: Family Medicine

## 2016-08-29 ENCOUNTER — Other Ambulatory Visit: Payer: Self-pay | Admitting: Acute Care

## 2016-08-29 DIAGNOSIS — Z87891 Personal history of nicotine dependence: Secondary | ICD-10-CM

## 2016-08-29 NOTE — Telephone Encounter (Signed)
Patient thought she was diagnosed emphysema informed patient that she was diagnosed with COPD

## 2016-08-31 ENCOUNTER — Telehealth: Payer: Self-pay

## 2016-08-31 DIAGNOSIS — Z1283 Encounter for screening for malignant neoplasm of skin: Secondary | ICD-10-CM

## 2016-08-31 LAB — ALPHA-1 ANTITRYPSIN PHENOTYPE: A-1 Antitrypsin: 137 mg/dL (ref 83–199)

## 2016-09-01 NOTE — Telephone Encounter (Signed)
Dermatology referral ordered and pt is aware.

## 2016-09-01 NOTE — Telephone Encounter (Signed)
Please refer as requested 

## 2016-09-14 DIAGNOSIS — L814 Other melanin hyperpigmentation: Secondary | ICD-10-CM | POA: Diagnosis not present

## 2016-09-14 DIAGNOSIS — Z79891 Long term (current) use of opiate analgesic: Secondary | ICD-10-CM | POA: Diagnosis not present

## 2016-09-14 DIAGNOSIS — G8921 Chronic pain due to trauma: Secondary | ICD-10-CM | POA: Diagnosis not present

## 2016-09-14 DIAGNOSIS — F411 Generalized anxiety disorder: Secondary | ICD-10-CM | POA: Diagnosis not present

## 2016-09-14 DIAGNOSIS — D225 Melanocytic nevi of trunk: Secondary | ICD-10-CM | POA: Diagnosis not present

## 2016-09-14 DIAGNOSIS — R51 Headache: Secondary | ICD-10-CM | POA: Diagnosis not present

## 2016-09-14 DIAGNOSIS — G894 Chronic pain syndrome: Secondary | ICD-10-CM | POA: Diagnosis not present

## 2016-09-14 DIAGNOSIS — Z79899 Other long term (current) drug therapy: Secondary | ICD-10-CM | POA: Diagnosis not present

## 2016-09-14 DIAGNOSIS — L988 Other specified disorders of the skin and subcutaneous tissue: Secondary | ICD-10-CM | POA: Diagnosis not present

## 2016-09-14 DIAGNOSIS — M47812 Spondylosis without myelopathy or radiculopathy, cervical region: Secondary | ICD-10-CM | POA: Diagnosis not present

## 2016-09-14 DIAGNOSIS — M47814 Spondylosis without myelopathy or radiculopathy, thoracic region: Secondary | ICD-10-CM | POA: Diagnosis not present

## 2016-09-14 DIAGNOSIS — R69 Illness, unspecified: Secondary | ICD-10-CM | POA: Diagnosis not present

## 2016-09-21 DIAGNOSIS — M545 Low back pain: Secondary | ICD-10-CM | POA: Diagnosis not present

## 2016-09-21 DIAGNOSIS — M5136 Other intervertebral disc degeneration, lumbar region: Secondary | ICD-10-CM | POA: Diagnosis not present

## 2016-09-21 DIAGNOSIS — M79605 Pain in left leg: Secondary | ICD-10-CM | POA: Diagnosis not present

## 2016-09-21 DIAGNOSIS — M5417 Radiculopathy, lumbosacral region: Secondary | ICD-10-CM | POA: Diagnosis not present

## 2016-09-25 DIAGNOSIS — G894 Chronic pain syndrome: Secondary | ICD-10-CM | POA: Diagnosis not present

## 2016-09-25 DIAGNOSIS — M961 Postlaminectomy syndrome, not elsewhere classified: Secondary | ICD-10-CM | POA: Diagnosis not present

## 2016-09-25 DIAGNOSIS — Z79891 Long term (current) use of opiate analgesic: Secondary | ICD-10-CM | POA: Diagnosis not present

## 2016-09-25 DIAGNOSIS — Z79899 Other long term (current) drug therapy: Secondary | ICD-10-CM | POA: Diagnosis not present

## 2016-09-25 DIAGNOSIS — M5489 Other dorsalgia: Secondary | ICD-10-CM | POA: Diagnosis not present

## 2016-09-25 DIAGNOSIS — M47814 Spondylosis without myelopathy or radiculopathy, thoracic region: Secondary | ICD-10-CM | POA: Diagnosis not present

## 2016-10-11 DIAGNOSIS — G894 Chronic pain syndrome: Secondary | ICD-10-CM | POA: Diagnosis not present

## 2016-10-11 DIAGNOSIS — R69 Illness, unspecified: Secondary | ICD-10-CM | POA: Diagnosis not present

## 2016-10-11 DIAGNOSIS — G8921 Chronic pain due to trauma: Secondary | ICD-10-CM | POA: Diagnosis not present

## 2016-10-11 DIAGNOSIS — M5489 Other dorsalgia: Secondary | ICD-10-CM | POA: Diagnosis not present

## 2016-10-11 DIAGNOSIS — M47814 Spondylosis without myelopathy or radiculopathy, thoracic region: Secondary | ICD-10-CM | POA: Diagnosis not present

## 2016-10-11 DIAGNOSIS — M47812 Spondylosis without myelopathy or radiculopathy, cervical region: Secondary | ICD-10-CM | POA: Diagnosis not present

## 2016-10-11 DIAGNOSIS — F411 Generalized anxiety disorder: Secondary | ICD-10-CM | POA: Diagnosis not present

## 2016-10-30 ENCOUNTER — Ambulatory Visit (INDEPENDENT_AMBULATORY_CARE_PROVIDER_SITE_OTHER): Payer: Medicare HMO | Admitting: Pulmonary Disease

## 2016-10-30 ENCOUNTER — Encounter: Payer: Self-pay | Admitting: Acute Care

## 2016-10-30 ENCOUNTER — Ambulatory Visit (INDEPENDENT_AMBULATORY_CARE_PROVIDER_SITE_OTHER): Payer: Medicare HMO | Admitting: Acute Care

## 2016-10-30 ENCOUNTER — Encounter: Payer: Self-pay | Admitting: Pulmonary Disease

## 2016-10-30 ENCOUNTER — Ambulatory Visit (INDEPENDENT_AMBULATORY_CARE_PROVIDER_SITE_OTHER)
Admission: RE | Admit: 2016-10-30 | Discharge: 2016-10-30 | Disposition: A | Discharge status: Home / Self Care | Payer: Medicare HMO | Source: Ambulatory Visit | Attending: Acute Care | Admitting: Acute Care

## 2016-10-30 VITALS — BP 118/60 | HR 75 | Ht 65.0 in | Wt 160.2 lb

## 2016-10-30 DIAGNOSIS — Z87891 Personal history of nicotine dependence: Secondary | ICD-10-CM | POA: Diagnosis not present

## 2016-10-30 DIAGNOSIS — J439 Emphysema, unspecified: Secondary | ICD-10-CM | POA: Diagnosis not present

## 2016-10-30 NOTE — Patient Instructions (Signed)
I'm glad that his symptoms are stable. It's okay to stop the inhalers if you dont see any difference in your breathing We'll see you back in clinic in 6 months and get pulmonary function tests on the day of return visit.

## 2016-10-30 NOTE — Progress Notes (Signed)
Sarah Chapman    [id number]    1961-01-06  Primary Care Physician:Stacks, Cletus Gash, MD  Referring Physician: Claretta Fraise, MD [place], [place] [address]  Chief complaint:  Follow up for COPD GOLD D (CAT score 13, several exacerbations over past year)  HPI: Mrs. Sarah Chapman is a 56 year old with history of COPD. She has been diagnosed in 2016 with PFTs that showed mild to moderate obstruction. She has been maintained on Advair but stopped taking this 1 month ago because of thrush.. She reports daily symptoms of dyspnea on exertion, cough with sputum production. She also saw al allergist in March 2018, diagnosed with allergies, asthma and was given astelin, fluticasone nasal sprays  She has about 25-pack-year smoking history and quit 1 month ago using Chantix but still has the urge to smoke. 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 She was diagnosed with a oibstructive sleep apnea in 2016 and is maintained on CPAP. She follows up with the sleep doctor at [facility] cannot remember the name]  Interim History: She has taken herself off all inhalers since she feels it's irritating her throat. She has not noticed any difference in her breathing off inhalers. She remains cigarette free since January of this year.  Outpatient Encounter Prescriptions as of 10/30/2016  Medication Sig  . ALPRAZolam (XANAX) 0.5 MG tablet Take 1 tablet by mouth as needed.   Marland Kitchen azelastine (OPTIVAR) 0.05 % ophthalmic solution PLACE 1 DROP INTO BOTH EYES EVERY 12 HOURS AS NEEDED FOR UP TO 30 DAYS (ITCHY OR REDDENED EYES)  . escitalopram (LEXAPRO) 20 MG tablet   . HYDROcodone-acetaminophen (NORCO) 10-325 MG per tablet Take 1-2 tablets by mouth every 6 (six) hours as needed for moderate pain.   Marland Kitchen loratadine (CLARITIN) 10 MG tablet Take 10 mg by mouth daily as needed for allergies.  Marland Kitchen nortriptyline (PAMELOR) 10 MG capsule Take 10 mg by mouth 3 (three)  times daily.  Marland Kitchen NUVIGIL 250 MG tablet Take 250 mg by mouth daily.  . valsartan (DIOVAN) 160 MG tablet Take 1 tablet (160 mg total) by mouth daily. For Blood pressure  . COMBIVENT RESPIMAT 20-100 MCG/ACT AERS respimat INHALE ONE PUFF INTO THE LUNGS EVERY 6 (SIX) HOURS AS NEEDED FOR WHEEZING.  Marland Kitchen Fluticasone-Salmeterol (ADVAIR DISKUS) 250-50 MCG/DOSE AEPB Inhale 1 puff into the lungs 2 (two) times daily. (Patient not taking: Reported on 10/30/2016)   No facility-administered encounter medications on file as of 10/30/2016.     Allergies as of 10/30/2016 - Review Complete 10/30/2016  Allergen Reaction Noted  . Clindamycin/lincomycin Rash 11/27/2012  . Penicillins  04/03/2014    Past Medical History:  Diagnosis Date  . Allergic rhinitis   . Back pain   . Chronic headache   . COPD (chronic obstructive pulmonary disease) ([place])   . Emphysema of lung ([place])   . Hyperlipidemia   . Hypertension   . Neck pain   . Sleep apnea     Past Surgical History:  Procedure Laterality Date  . NECK SURGERY    . TONSILLECTOMY      Family History  Problem Relation Age of Onset  . Bone cancer Mother     Social History   Social History  . Marital status: Divorced    Spouse name: N/A  . Number of children: N/A  . Years of education: N/A   Occupational History  . Not  on file.   Social History Main Topics  . Smoking status: Former Smoker    Packs/day: 1.00    Types: Cigarettes    Quit date: 07/25/2016  . Smokeless tobacco: Never Used  . Alcohol use No  . Drug use: No  . Sexual activity: Not on file   Other Topics Concern  . Not on file   Social History Narrative  . No narrative on file    Review of systems: Review of Systems  Constitutional: Negative for fever and chills.  HENT: Negative.   Eyes: Negative for blurred vision.  Respiratory: as per HPI  Cardiovascular: Negative for chest pain and palpitations.  Gastrointestinal: Negative for vomiting, diarrhea, blood per  rectum. Genitourinary: Negative for dysuria, urgency, frequency and hematuria.  Musculoskeletal: Negative for myalgias, back pain and joint pain.  Skin: Negative for itching and rash.  Neurological: Negative for dizziness, tremors, focal weakness, seizures and loss of consciousness.  Endo/Heme/Allergies: Negative for environmental allergies.  Psychiatric/Behavioral: Negative for depression, suicidal ideas and hallucinations.  All other systems reviewed and are negative.  Physical Exam: Blood pressure 118/60, pulse 75, height 5\' 5"  (1.651 m), weight 160 lb 3.2 oz (72.7 kg), SpO2 98 %. Gen:      No acute distress HEENT:  EOMI, sclera anicteric Neck:     No masses; no thyromegaly Lungs:    Clear to auscultation bilaterally; normal respiratory effort CV:         Regular rate and rhythm; no murmurs Abd:      + bowel sounds; soft, non-tender; no palpable masses, no distension Ext:    No edema; adequate peripheral perfusion Skin:      Warm and dry; no rash Neuro: alert and oriented x 3 Psych: normal mood and affect  Data Reviewed: PFTs 04/16/15 Mild to moderate obstruction with reduction in expiratory flow post inhaled bronchodilator. Normal lung volumes and diffusing capacity.  Sleep study 03/02/15 RDI 24.9 Sat 87% Average Sat 91%   Chest x-ray 07/11/18-hyperinflation consistent with COPD. No lung infiltrate. I reviewed the images personally.  A1AT 08/22/16- 137, PIMM  Assessment:  COPD GOLD D Now off all inhalers as the patient doesn't see any benefit to them. We will get PFTs to reassess lung function. I have asked her to call us back if his symptoms worsen.  Ex-smoker Encouraged to keep of cigarettes. She has been referred to low-dose screening CTs of the chest.  OSA Stable on CPAP  Plan/Recommendations: - Follow symptoms. Consider reinitiation of her inhalers if she worsens - Low dose cancer screening  Marshell Garfinkel MD [facility] Pager [phone]  [phone] 10/30/2016, 12:02 PM  CC: Claretta Fraise, MD

## 2016-10-30 NOTE — Progress Notes (Signed)
Shared Decision Making Visit Lung Cancer Screening Program [id number])   Eligibility:  Age 56 y.o.  Pack Years Smoking History Calculation 33 pack year smoking history (# packs/per year x # years smoked)  Recent History of coughing up blood  no  Unexplained weight loss? no ( >Than 15 pounds within the last 6 months )  Prior History Lung / other cancer no (Diagnosis within the last 5 years already requiring surveillance chest CT Scans).  Smoking Status Former Smoker  Former Smokers: Years since quit: < 1 year  Quit Date: 07/25/2016  Visit Components:  Discussion included one or more decision making aids. yes  Discussion included risk/benefits of screening. yes  Discussion included potential follow up diagnostic testing for abnormal scans. yes  Discussion included meaning and risk of over diagnosis. yes  Discussion included meaning and risk of False Positives. yes  Discussion included meaning of total radiation exposure. yes  Counseling Included:  Importance of adherence to annual lung cancer LDCT screening. yes  Impact of comorbidities on ability to participate in the program. yes  Ability and willingness to under diagnostic treatment. yes  Smoking Cessation Counseling:  Current Smokers:   Discussed importance of smoking cessation. yes  Information about tobacco cessation classes and interventions provided to patient. yes  Patient provided with "ticket" for LDCT Scan. yes  Symptomatic Patient. no  Counseling  Diagnosis Code: Tobacco Use Z72.0  Asymptomatic Patient yes  Counseling (Intermediate counseling: > three minutes counseling) [id number]  Former Smokers:   Discussed the importance of maintaining cigarette abstinence. yes  Diagnosis Code: Personal History of Nicotine Dependence. O87.867  Information about tobacco cessation classes and interventions provided to patient. Yes  Patient provided with "ticket" for LDCT Scan. yes  Written Order for Lung  Cancer Screening with LDCT placed in Epic. Yes (CT Chest Lung Cancer Screening Low Dose W/O CM) [id number] Z12.2-Screening of respiratory organs Z87.891-Personal history of nicotine dependence  I spent 25 minutes of face to face time with Ms. Riesgo discussing the risks and benefits of lung cancer screening. We viewed a power point together that explained in detail the above noted topics. We took the time to pause the power point at intervals to allow for questions to be asked and answered to ensure understanding. We discussed that she had taken the single most powerful action possible to decrease her risk of developing lung cancer when she quit smoking. I counseled her to remain smoke free, and to contact me if she  ever had the desire to smoke again so that I can provide resources and tools to help support the effort to remain smoke free. We discussed the time and location of the scan, and that either  Doroteo Glassman RN or I will call with the results within  24-48 hours of receiving them. She  has my card and contact information in the event she  needs to speak with me, in addition to a copy of the power point we reviewed as a resource. She  verbalized understanding of all of the above and had no further questions upon leaving the office.   I explained to the patient that there has been a high incidence of coronary artery disease noted on these exams. I explained that this is a non-gated exam therefore degree or severity cannot be determined. This patient is not on statin therapy. I have asked the patient to follow-up with their PCP regarding any incidental finding of coronary artery disease and management with diet or medication as they  feel is clinically indicated. The patient verbalized understanding of the above and had no further questions.     Magdalen Spatz, NP 10/30/2016

## 2016-11-01 ENCOUNTER — Telehealth: Payer: Self-pay | Admitting: Family Medicine

## 2016-11-02 ENCOUNTER — Other Ambulatory Visit: Payer: Self-pay | Admitting: Acute Care

## 2016-11-02 ENCOUNTER — Telehealth: Payer: Self-pay | Admitting: Family Medicine

## 2016-11-02 DIAGNOSIS — Z87891 Personal history of nicotine dependence: Secondary | ICD-10-CM

## 2016-11-02 NOTE — Telephone Encounter (Signed)
Spoke to pt

## 2016-11-07 ENCOUNTER — Other Ambulatory Visit: Payer: Self-pay | Admitting: Family Medicine

## 2016-11-07 DIAGNOSIS — Z780 Asymptomatic menopausal state: Secondary | ICD-10-CM

## 2016-11-08 NOTE — Telephone Encounter (Signed)
Scheduled and letter sent with appointment date/time

## 2016-11-13 DIAGNOSIS — Z79891 Long term (current) use of opiate analgesic: Secondary | ICD-10-CM | POA: Diagnosis not present

## 2016-11-13 DIAGNOSIS — G894 Chronic pain syndrome: Secondary | ICD-10-CM | POA: Diagnosis not present

## 2016-11-13 DIAGNOSIS — Z79899 Other long term (current) drug therapy: Secondary | ICD-10-CM | POA: Diagnosis not present

## 2016-11-13 DIAGNOSIS — M5489 Other dorsalgia: Secondary | ICD-10-CM | POA: Diagnosis not present

## 2016-11-13 DIAGNOSIS — M47814 Spondylosis without myelopathy or radiculopathy, thoracic region: Secondary | ICD-10-CM | POA: Diagnosis not present

## 2016-11-13 DIAGNOSIS — M961 Postlaminectomy syndrome, not elsewhere classified: Secondary | ICD-10-CM | POA: Diagnosis not present

## 2016-11-16 ENCOUNTER — Other Ambulatory Visit: Payer: Medicare HMO

## 2016-12-04 ENCOUNTER — Other Ambulatory Visit: Payer: Medicare HMO

## 2016-12-04 DIAGNOSIS — M47814 Spondylosis without myelopathy or radiculopathy, thoracic region: Secondary | ICD-10-CM | POA: Diagnosis not present

## 2016-12-04 DIAGNOSIS — M5489 Other dorsalgia: Secondary | ICD-10-CM | POA: Diagnosis not present

## 2016-12-04 DIAGNOSIS — Z79899 Other long term (current) drug therapy: Secondary | ICD-10-CM | POA: Diagnosis not present

## 2016-12-04 DIAGNOSIS — G894 Chronic pain syndrome: Secondary | ICD-10-CM | POA: Diagnosis not present

## 2016-12-04 DIAGNOSIS — M961 Postlaminectomy syndrome, not elsewhere classified: Secondary | ICD-10-CM | POA: Diagnosis not present

## 2016-12-04 DIAGNOSIS — Z79891 Long term (current) use of opiate analgesic: Secondary | ICD-10-CM | POA: Diagnosis not present

## 2016-12-11 DIAGNOSIS — G894 Chronic pain syndrome: Secondary | ICD-10-CM | POA: Diagnosis not present

## 2016-12-11 DIAGNOSIS — R69 Illness, unspecified: Secondary | ICD-10-CM | POA: Diagnosis not present

## 2016-12-11 DIAGNOSIS — M792 Neuralgia and neuritis, unspecified: Secondary | ICD-10-CM | POA: Diagnosis not present

## 2016-12-11 DIAGNOSIS — G609 Hereditary and idiopathic neuropathy, unspecified: Secondary | ICD-10-CM | POA: Diagnosis not present

## 2016-12-13 DIAGNOSIS — R69 Illness, unspecified: Secondary | ICD-10-CM | POA: Diagnosis not present

## 2016-12-13 DIAGNOSIS — F411 Generalized anxiety disorder: Secondary | ICD-10-CM | POA: Diagnosis not present

## 2016-12-13 DIAGNOSIS — G8921 Chronic pain due to trauma: Secondary | ICD-10-CM | POA: Diagnosis not present

## 2016-12-18 ENCOUNTER — Telehealth: Payer: Self-pay | Admitting: Family Medicine

## 2016-12-18 NOTE — Telephone Encounter (Signed)
Scheduled

## 2016-12-26 ENCOUNTER — Ambulatory Visit (INDEPENDENT_AMBULATORY_CARE_PROVIDER_SITE_OTHER): Payer: Medicare HMO

## 2016-12-26 DIAGNOSIS — Z780 Asymptomatic menopausal state: Secondary | ICD-10-CM

## 2017-01-08 ENCOUNTER — Encounter: Payer: Medicare HMO | Admitting: Pharmacist

## 2017-01-08 DIAGNOSIS — Z79891 Long term (current) use of opiate analgesic: Secondary | ICD-10-CM | POA: Diagnosis not present

## 2017-01-08 DIAGNOSIS — M5417 Radiculopathy, lumbosacral region: Secondary | ICD-10-CM | POA: Diagnosis not present

## 2017-01-08 DIAGNOSIS — M47814 Spondylosis without myelopathy or radiculopathy, thoracic region: Secondary | ICD-10-CM | POA: Diagnosis not present

## 2017-01-08 DIAGNOSIS — G894 Chronic pain syndrome: Secondary | ICD-10-CM | POA: Diagnosis not present

## 2017-01-08 DIAGNOSIS — M5489 Other dorsalgia: Secondary | ICD-10-CM | POA: Diagnosis not present

## 2017-01-08 DIAGNOSIS — Z79899 Other long term (current) drug therapy: Secondary | ICD-10-CM | POA: Diagnosis not present

## 2017-01-09 DIAGNOSIS — F411 Generalized anxiety disorder: Secondary | ICD-10-CM | POA: Diagnosis not present

## 2017-01-09 DIAGNOSIS — R69 Illness, unspecified: Secondary | ICD-10-CM | POA: Diagnosis not present

## 2017-01-09 DIAGNOSIS — G8921 Chronic pain due to trauma: Secondary | ICD-10-CM | POA: Diagnosis not present

## 2017-01-19 ENCOUNTER — Ambulatory Visit (INDEPENDENT_AMBULATORY_CARE_PROVIDER_SITE_OTHER): Payer: Medicare HMO | Admitting: Family Medicine

## 2017-01-19 ENCOUNTER — Encounter: Payer: Self-pay | Admitting: Family Medicine

## 2017-01-19 ENCOUNTER — Ambulatory Visit (INDEPENDENT_AMBULATORY_CARE_PROVIDER_SITE_OTHER): Payer: Medicare HMO

## 2017-01-19 VITALS — BP 122/77 | HR 83 | Temp 97.9°F | Ht 65.0 in | Wt 164.0 lb

## 2017-01-19 DIAGNOSIS — R109 Unspecified abdominal pain: Secondary | ICD-10-CM

## 2017-01-19 DIAGNOSIS — K5900 Constipation, unspecified: Secondary | ICD-10-CM

## 2017-01-19 LAB — URINALYSIS
Bilirubin, UA: NEGATIVE
Glucose, UA: NEGATIVE
KETONES UA: NEGATIVE
Leukocytes, UA: NEGATIVE
NITRITE UA: NEGATIVE
Protein, UA: NEGATIVE
RBC UA: NEGATIVE
SPEC GRAV UA: 1.015 (ref 1.005–1.030)
UUROB: 0.2 mg/dL (ref 0.2–1.0)
pH, UA: 5.5 (ref 5.0–7.5)

## 2017-01-19 MED ORDER — LINACLOTIDE 145 MCG PO CAPS: 145.0000 ug | ORAL_CAPSULE | Freq: Every day | ORAL | 5 refills | Status: DC

## 2017-01-19 NOTE — Progress Notes (Signed)
Subjective:  Patient ID: Sarah Chapman, female    DOB: 11/20/1960  Age: 56 y.o. MRN: [id number]  CC: Abdominal Pain (pt here today c/o abdominal pain x 1 month. It comes and goes and her psych doctor told her it was her fibromyalgia.)   HPI [name] presents for  Diffuse abdominal pain moderately severe ongoing for 1 month. It burns and aches. Seems to be focused at the left upper quadrant. It may last Hours a time although it is intermttent. She claims that the CPAP from her apnea blows air into her stomach and increase her gas and pain. She's not had any change in her bowel movements. They remain daily, formed regular and nonpainful. She denies melena and hematochezia.Colonoscopy done last year was normal .  Depression screen [facility] 2/9 08/09/2016 07/25/2016 07/11/2016  Decreased Interest '1 1 1  ' Down, Depressed, Hopeless '1 1 1  ' PHQ - 2 Score '2 2 2  ' Altered sleeping 0 1 1  Tired, decreased energy '1 2 2  ' Change in appetite 0 1 1  Feeling bad or failure about yourself  0 0 0  Trouble concentrating '2 1 1  ' Moving slowly or fidgety/restless 0 0 0  Suicidal thoughts 0 0 0  PHQ-9 Score '5 7 7    ' History Ardyce has a past medical history of Allergic rhinitis; Back pain; Chronic headache; COPD (chronic obstructive pulmonary disease) ([place]); Emphysema of lung ([place]); Hyperlipidemia; Hypertension; Neck pain; and Sleep apnea.   She has a past surgical history that includes Neck surgery and Tonsillectomy.   Her family history includes Bone cancer in her mother.She reports that she quit smoking about 5 months ago. Her smoking use included Cigarettes. She has a 33.00 pack-year smoking history. She has never used smokeless tobacco. She reports that she does not drink alcohol or use drugs.    ROS Review of Systems  Constitutional: Negative for activity change, appetite change and fever.  HENT: Negative for congestion, rhinorrhea and sore throat.   Eyes: Negative for visual disturbance.  Respiratory:  Negative for cough and shortness of breath.   Cardiovascular: Negative for chest pain and palpitations.  Gastrointestinal: Positive for abdominal distention, abdominal pain and nausea. Negative for blood in stool, constipation, diarrhea, rectal pain and vomiting.  Genitourinary: Negative for dysuria.  Musculoskeletal: Negative for arthralgias and myalgias.    Objective:  BP 122/77   Pulse 83   Temp 97.9 F (36.6 C) (Oral)   Ht '5\' 5"'  (1.651 m)   Wt 164 lb (74.4 kg)   BMI 27.29 kg/m   BP Readings from Last 3 Encounters:  01/19/17 122/77  10/30/16 118/60  08/22/16 (!) 150/82    Wt Readings from Last 3 Encounters:  01/19/17 164 lb (74.4 kg)  10/30/16 160 lb 3.2 oz (72.7 kg)  08/22/16 153 lb 6.4 oz (69.6 kg)     Physical Exam  Constitutional: She is oriented to person, place, and time. She appears well-developed and well-nourished.  HENT:  Head: Normocephalic and atraumatic.  Cardiovascular: Normal rate and regular rhythm.   No murmur heard. Pulmonary/Chest: Effort normal and breath sounds normal.  Abdominal: Soft. Bowel sounds are normal. She exhibits no mass. There is tenderness. There is no rebound and no guarding.  Neurological: She is alert and oriented to person, place, and time.  Skin: Skin is warm and dry.  Psychiatric: She has a normal mood and affect. Her behavior is normal.     XR: Consistent with Ileus with constipation  Assessment & Plan:   Kaydynce was seen today for abdominal pain.  Diagnoses and all orders for this visit:  Abdominal pain, unspecified abdominal location -     CBC with Differential/Platelet -     CMP14+EGFR -     Lipase -     Urinalysis -     DG Abd 2 Views; Future  Obstipation  Other orders -     linaclotide (LINZESS) 145 MCG CAPS capsule; Take 1 capsule (145 mcg total) by mouth daily. To regulate bowel movements       I have discontinued Ms. Sigley's COMBIVENT RESPIMAT and Fluticasone-Salmeterol. I am also having her start on  linaclotide. Additionally, I am having her maintain her HYDROcodone-acetaminophen, NUVIGIL, ALPRAZolam, loratadine, azelastine, nortriptyline, valsartan, escitalopram, HYSINGLA ER, and cyclobenzaprine.  Allergies as of 01/19/2017      Reactions   Clindamycin/lincomycin Rash   Penicillins    Unknown      Medication List       Accurate as of 01/19/17 11:59 PM. Always use your most recent med list.          ALPRAZolam 0.5 MG tablet Commonly known as:  XANAX Take 1 tablet by mouth as needed.   azelastine 0.05 % ophthalmic solution Commonly known as:  OPTIVAR PLACE 1 DROP INTO BOTH EYES EVERY 12 HOURS AS NEEDED FOR UP TO 30 DAYS (ITCHY OR REDDENED EYES)   cyclobenzaprine 5 MG tablet Commonly known as:  FLEXERIL   escitalopram 20 MG tablet Commonly known as:  LEXAPRO   HYDROcodone-acetaminophen 10-325 MG tablet Commonly known as:  NORCO Take 1-2 tablets by mouth every 6 (six) hours as needed for moderate pain.   HYSINGLA ER 30 MG T24a Generic drug:  HYDROcodone Bitartrate ER   linaclotide 145 MCG Caps capsule Commonly known as:  LINZESS Take 1 capsule (145 mcg total) by mouth daily. To regulate bowel movements   loratadine 10 MG tablet Commonly known as:  CLARITIN Take 10 mg by mouth daily as needed for allergies.   nortriptyline 10 MG capsule Commonly known as:  PAMELOR Take 10 mg by mouth 3 (three) times daily.   NUVIGIL 250 MG tablet Generic drug:  Armodafinil Take 250 mg by mouth daily.   valsartan 160 MG tablet Commonly known as:  DIOVAN Take 1 tablet (160 mg total) by mouth daily. For Blood pressure            Discharge Care Instructions        Start     Ordered   01/19/17 0000  CBC with Differential/Platelet     01/19/17 1629   01/19/17 0000  CMP14+EGFR     01/19/17 1629   01/19/17 0000  Lipase     01/19/17 1629   01/19/17 0000  Urinalysis     01/19/17 1629   01/19/17 0000  DG Abd 2 Views    Question Answer Comment  Reason for Exam (SYMPTOM   OR DIAGNOSIS REQUIRED) abdominal pain   Is patient pregnant? No   Preferred imaging location? Internal   Radiology Contrast Protocol - do NOT remove file path [email or web address]      01/19/17 1632   01/19/17 0000  linaclotide (LINZESS) 145 MCG CAPS capsule  Daily     01/19/17 1702       Follow-up: Return in about 5 years (around 01/19/2022), or if symptoms worsen or fail to improve, for abd sx.  Claretta Fraise, M.D.

## 2017-01-20 LAB — CMP14+EGFR
ALT: 29 IU/L (ref 0–32)
AST: 23 IU/L (ref 0–40)
Albumin/Globulin Ratio: 1.8 (ref 1.2–2.2)
Albumin: 4.3 g/dL (ref 3.5–5.5)
Alkaline Phosphatase: 108 IU/L (ref 39–117)
BUN/Creatinine Ratio: 15 (ref 9–23)
BUN: 11 mg/dL (ref 6–24)
Bilirubin Total: <0.2 mg/dL (ref 0.0–1.2)
CALCIUM: 9.7 mg/dL (ref 8.7–10.2)
CO2: 27 mmol/L (ref 20–29)
CREATININE: 0.71 mg/dL (ref 0.57–1.00)
Chloride: 99 mmol/L (ref 96–106)
GFR, EST AFRICAN AMERICAN: 109 mL/min/{1.73_m2} (ref >59)
GFR, EST NON AFRICAN AMERICAN: 95 mL/min/{1.73_m2} (ref >59)
GLUCOSE: 87 mg/dL (ref 65–99)
Globulin, Total: 2.4 g/dL (ref 1.5–4.5)
POTASSIUM: 4.2 mmol/L (ref 3.5–5.2)
Sodium: 141 mmol/L (ref 134–144)
TOTAL PROTEIN: 6.7 g/dL (ref 6.0–8.5)

## 2017-01-20 LAB — CBC WITH DIFFERENTIAL/PLATELET
BASOS: 1 %
Basophils Absolute: 0 10*3/uL (ref 0.0–0.2)
EOS (ABSOLUTE): 0.2 10*3/uL (ref 0.0–0.4)
Eos: 2 %
Hematocrit: 36.4 % (ref 34.0–46.6)
Hemoglobin: 11.9 g/dL (ref 11.1–15.9)
IMMATURE GRANS (ABS): 0 10*3/uL (ref 0.0–0.1)
IMMATURE GRANULOCYTES: 0 %
LYMPHS: 38 %
Lymphocytes Absolute: 2.5 10*3/uL (ref 0.7–3.1)
MCH: 30.7 pg (ref 26.6–33.0)
MCHC: 32.7 g/dL (ref 31.5–35.7)
MCV: 94 fL (ref 79–97)
MONOS ABS: 0.5 10*3/uL (ref 0.1–0.9)
Monocytes: 8 %
NEUTROS PCT: 51 %
Neutrophils Absolute: 3.3 10*3/uL (ref 1.4–7.0)
PLATELETS: 309 10*3/uL (ref 150–379)
RBC: 3.88 x10E6/uL (ref 3.77–5.28)
RDW: 13 % (ref 12.3–15.4)
WBC: 6.6 10*3/uL (ref 3.4–10.8)

## 2017-01-20 LAB — LIPASE: Lipase: 39 U/L (ref 14–72)

## 2017-01-22 ENCOUNTER — Telehealth: Payer: Self-pay | Admitting: Family Medicine

## 2017-01-22 ENCOUNTER — Other Ambulatory Visit: Payer: Self-pay | Admitting: Family Medicine

## 2017-01-22 DIAGNOSIS — K567 Ileus, unspecified: Secondary | ICD-10-CM

## 2017-01-22 DIAGNOSIS — R109 Unspecified abdominal pain: Secondary | ICD-10-CM | POA: Insufficient documentation

## 2017-01-22 DIAGNOSIS — R1084 Generalized abdominal pain: Secondary | ICD-10-CM

## 2017-01-22 DIAGNOSIS — R1031 Right lower quadrant pain: Secondary | ICD-10-CM | POA: Insufficient documentation

## 2017-01-22 NOTE — Telephone Encounter (Signed)
Spoke with Remo Lipps on this - she has left Mess. For pt to call her.

## 2017-01-22 NOTE — Telephone Encounter (Signed)
I spoke to [place] about  This. Check with her to make sure this has been taken care of.

## 2017-01-22 NOTE — Telephone Encounter (Signed)
Patient calling back for xray results. Please advise.

## 2017-01-23 DIAGNOSIS — M47814 Spondylosis without myelopathy or radiculopathy, thoracic region: Secondary | ICD-10-CM | POA: Diagnosis not present

## 2017-01-23 DIAGNOSIS — M5136 Other intervertebral disc degeneration, lumbar region: Secondary | ICD-10-CM | POA: Diagnosis not present

## 2017-01-23 DIAGNOSIS — Z79891 Long term (current) use of opiate analgesic: Secondary | ICD-10-CM | POA: Diagnosis not present

## 2017-01-23 DIAGNOSIS — G894 Chronic pain syndrome: Secondary | ICD-10-CM | POA: Diagnosis not present

## 2017-01-23 DIAGNOSIS — M5489 Other dorsalgia: Secondary | ICD-10-CM | POA: Diagnosis not present

## 2017-01-23 DIAGNOSIS — Z79899 Other long term (current) drug therapy: Secondary | ICD-10-CM | POA: Diagnosis not present

## 2017-01-30 ENCOUNTER — Telehealth: Payer: Self-pay | Admitting: Family Medicine

## 2017-01-30 DIAGNOSIS — K589 Irritable bowel syndrome without diarrhea: Secondary | ICD-10-CM

## 2017-01-30 NOTE — Telephone Encounter (Signed)
Pt aware of MD feedback and referral placed for GI.

## 2017-01-30 NOTE — Telephone Encounter (Signed)
Patient has been taking Linzess exactly as prescribed but is still having abdominal pain and constipation.  Would like to know if she can do an enema and if so what you recommend?

## 2017-01-30 NOTE — Telephone Encounter (Signed)
Please contact the patient an enema will QID temporarily. Needs GI appt.

## 2017-02-02 ENCOUNTER — Encounter (HOSPITAL_COMMUNITY): Payer: Self-pay

## 2017-02-02 ENCOUNTER — Ambulatory Visit (HOSPITAL_COMMUNITY)
Admission: RE | Admit: 2017-02-02 | Discharge: 2017-02-02 | Disposition: A | Discharge status: Home / Self Care | Payer: Medicare HMO | Source: Ambulatory Visit | Attending: Family Medicine | Admitting: Family Medicine

## 2017-02-02 DIAGNOSIS — K229 Disease of esophagus, unspecified: Secondary | ICD-10-CM | POA: Diagnosis not present

## 2017-02-02 DIAGNOSIS — K5730 Diverticulosis of large intestine without perforation or abscess without bleeding: Secondary | ICD-10-CM | POA: Insufficient documentation

## 2017-02-02 DIAGNOSIS — K449 Diaphragmatic hernia without obstruction or gangrene: Secondary | ICD-10-CM | POA: Insufficient documentation

## 2017-02-02 DIAGNOSIS — K760 Fatty (change of) liver, not elsewhere classified: Secondary | ICD-10-CM | POA: Diagnosis not present

## 2017-02-02 DIAGNOSIS — R1084 Generalized abdominal pain: Secondary | ICD-10-CM

## 2017-02-02 DIAGNOSIS — R109 Unspecified abdominal pain: Secondary | ICD-10-CM | POA: Diagnosis not present

## 2017-02-02 DIAGNOSIS — K567 Ileus, unspecified: Secondary | ICD-10-CM | POA: Diagnosis present

## 2017-02-02 MED ORDER — IOPAMIDOL (ISOVUE-300) INJECTION 61%
100.0000 mL | Freq: Once | INTRAVENOUS | Status: AC | PRN
  Administered 2017-02-02: 100 mL via INTRAVENOUS

## 2017-02-05 ENCOUNTER — Other Ambulatory Visit: Payer: Self-pay | Admitting: Family Medicine

## 2017-02-05 MED ORDER — PANTOPRAZOLE SODIUM 40 MG PO TBEC: 40.0000 mg | DELAYED_RELEASE_TABLET | Freq: Every day | ORAL | 11 refills | Status: DC

## 2017-02-06 ENCOUNTER — Encounter: Payer: Self-pay | Admitting: Gastroenterology

## 2017-02-07 DIAGNOSIS — M47812 Spondylosis without myelopathy or radiculopathy, cervical region: Secondary | ICD-10-CM | POA: Diagnosis not present

## 2017-02-07 DIAGNOSIS — M47814 Spondylosis without myelopathy or radiculopathy, thoracic region: Secondary | ICD-10-CM | POA: Diagnosis not present

## 2017-02-07 DIAGNOSIS — M5489 Other dorsalgia: Secondary | ICD-10-CM | POA: Diagnosis not present

## 2017-02-07 DIAGNOSIS — Z79891 Long term (current) use of opiate analgesic: Secondary | ICD-10-CM | POA: Diagnosis not present

## 2017-02-07 DIAGNOSIS — Z79899 Other long term (current) drug therapy: Secondary | ICD-10-CM | POA: Diagnosis not present

## 2017-02-07 DIAGNOSIS — G894 Chronic pain syndrome: Secondary | ICD-10-CM | POA: Diagnosis not present

## 2017-02-13 ENCOUNTER — Ambulatory Visit: Payer: Medicare HMO | Admitting: Family Medicine

## 2017-02-13 DIAGNOSIS — G8921 Chronic pain due to trauma: Secondary | ICD-10-CM | POA: Diagnosis not present

## 2017-02-13 DIAGNOSIS — F411 Generalized anxiety disorder: Secondary | ICD-10-CM | POA: Diagnosis not present

## 2017-02-13 DIAGNOSIS — R69 Illness, unspecified: Secondary | ICD-10-CM | POA: Diagnosis not present

## 2017-02-16 ENCOUNTER — Ambulatory Visit (INDEPENDENT_AMBULATORY_CARE_PROVIDER_SITE_OTHER): Payer: Medicare HMO | Admitting: Family Medicine

## 2017-02-16 ENCOUNTER — Encounter: Payer: Self-pay | Admitting: Family Medicine

## 2017-02-16 VITALS — BP 129/74 | HR 88 | Temp 98.3°F | Ht 65.0 in | Wt 166.0 lb

## 2017-02-16 DIAGNOSIS — K5903 Drug induced constipation: Secondary | ICD-10-CM

## 2017-02-16 DIAGNOSIS — R1011 Right upper quadrant pain: Secondary | ICD-10-CM | POA: Diagnosis not present

## 2017-02-16 DIAGNOSIS — K589 Irritable bowel syndrome without diarrhea: Secondary | ICD-10-CM

## 2017-02-16 DIAGNOSIS — J439 Emphysema, unspecified: Secondary | ICD-10-CM

## 2017-02-16 NOTE — Progress Notes (Signed)
Subjective:  Patient ID: Sarah Chapman, female    DOB: [birth year]  Age: 56 y.o. MRN: [id number]  CC: Follow-up (pt here today for follow up after starting linzess and she says it is working but not sure it is stong enough)   HPI Sarah Chapman presents for Follow-up of her abdominal discomfort. She has had moderate improvement with the Linzess but thinks it's probably not strong enough.She says that she is known about the hiatal hernia for years and she keeps the head of her bed elevated.However, she still has discomfort in the right upper quadrant.She believes that she does have some fibromyalgia ongoing in the lower abdomen. lPatient notes she is having a daily bowel movement but it just does not seem like she is having a complete bowel movement because they're small. Appetite is fair.  Patient says that she has shortness of breath when laying down. She discontinued smoking 7 months ago. However she smoked for many years and has used inhalers in the past. She says the purple disc apparently Advair didn't do any good.  Patient is a client of pain clinic. She was recently denied opiates due to testing positive for cocaine.  Depression screen [facility] 2/9 08/09/2016 07/25/2016 07/11/2016  Decreased Interest 1 1 1   Down, Depressed, Hopeless 1 1 1   PHQ - 2 Score 2 2 2   Altered sleeping 0 1 1  Tired, decreased energy 1 2 2   Change in appetite 0 1 1  Feeling bad or failure about yourself  0 0 0  Trouble concentrating 2 1 1   Moving slowly or fidgety/restless 0 0 0  Suicidal thoughts 0 0 0  PHQ-9 Score 5 7 7     History Sarah Chapman has a past medical history of Allergic rhinitis; Back pain; Chronic headache; COPD (chronic obstructive pulmonary disease) ([place]); Emphysema of lung ([place]); Hyperlipidemia; Hypertension; Neck pain; and Sleep apnea.   She has a past surgical history that includes Neck surgery and Tonsillectomy.   Her family history includes Bone cancer in her mother.She reports that she quit smoking  about 6 months ago. Her smoking use included Cigarettes. She has a 33.00 pack-year smoking history. She has never used smokeless tobacco. She reports that she does not drink alcohol or use drugs.    ROS Review of Systems  Constitutional: Negative for activity change, appetite change and fever.  HENT: Negative for congestion, rhinorrhea and sore throat.   Eyes: Negative for visual disturbance.  Respiratory: Positive for shortness of breath. Negative for cough.   Cardiovascular: Negative for chest pain and palpitations.  Gastrointestinal: Positive for abdominal pain and constipation. Negative for diarrhea and nausea.  Genitourinary: Negative for dysuria.  Musculoskeletal: Negative for arthralgias and myalgias.    Objective:  BP 129/74   Pulse 88   Temp 98.3 F (36.8 C) (Oral)   Ht 5\' 5"  (1.651 m)   Wt 166 lb (75.3 kg)   BMI 27.62 kg/m   BP Readings from Last 3 Encounters:  02/16/17 129/74  01/19/17 122/77  10/30/16 118/60    Wt Readings from Last 3 Encounters:  02/16/17 166 lb (75.3 kg)  01/19/17 164 lb (74.4 kg)  10/30/16 160 lb 3.2 oz (72.7 kg)     Physical Exam  Constitutional: She is oriented to person, place, and time. She appears well-developed and well-nourished. No distress.  HENT:  Head: Normocephalic and atraumatic.  Right Ear: External ear normal.  Left Ear: External ear normal.  Nose: Nose normal.  Mouth/Throat: Oropharynx is clear  and moist.  Eyes: Pupils are equal, round, and reactive to light. Conjunctivae and EOM are normal.  Neck: Normal range of motion. Neck supple. No thyromegaly present.  Cardiovascular: Normal rate, regular rhythm and normal heart sounds.   No murmur heard. Pulmonary/Chest: Effort normal and breath sounds normal. No respiratory distress. She has no wheezes. She has no rales.  Abdominal: Soft. Bowel sounds are normal. She exhibits distension (Diminished since last exam). There is tenderness (RUQ).  Musculoskeletal: Normal range  of motion.  Lymphadenopathy:    She has no cervical adenopathy.  Neurological: She is alert and oriented to person, place, and time. She has normal reflexes.  Skin: Skin is warm and dry.  Psychiatric: Her speech is normal. Her affect is blunt. Cognition and memory are normal.      Assessment & Plan:   Sarah Chapman was seen today for follow-up.  Diagnoses and all orders for this visit:  Drug-induced constipation  Right upper quadrant abdominal pain  Pulmonary emphysema, unspecified emphysema type ([place])  Irritable bowel syndrome, unspecified type       I am having Ms. Sarah Chapman maintain her HYDROcodone-acetaminophen, NUVIGIL, ALPRAZolam, loratadine, azelastine, nortriptyline, valsartan, escitalopram, HYSINGLA ER, cyclobenzaprine, linaclotide, pantoprazole, and buPROPion.  Allergies as of 02/16/2017      Reactions   Clindamycin/lincomycin Rash   Penicillins    Unknown      Medication List       Accurate as of 02/16/17  1:35 PM. Always use your most recent med list.          ALPRAZolam 0.5 MG tablet Commonly known as:  XANAX Take 1 tablet by mouth as needed.   azelastine 0.05 % ophthalmic solution Commonly known as:  OPTIVAR PLACE 1 DROP INTO BOTH EYES EVERY 12 HOURS AS NEEDED FOR UP TO 30 DAYS (ITCHY OR REDDENED EYES)   buPROPion 75 MG tablet Commonly known as:  WELLBUTRIN Take 75 mg by mouth 2 (two) times daily.   cyclobenzaprine 5 MG tablet Commonly known as:  FLEXERIL   escitalopram 20 MG tablet Commonly known as:  LEXAPRO   HYDROcodone-acetaminophen 10-325 MG tablet Commonly known as:  NORCO Take 1-2 tablets by mouth every 6 (six) hours as needed for moderate pain.   HYSINGLA ER 30 MG T24a Generic drug:  HYDROcodone Bitartrate ER   linaclotide 145 MCG Caps capsule Commonly known as:  LINZESS Take 1 capsule (145 mcg total) by mouth daily. To regulate bowel movements   loratadine 10 MG tablet Commonly known as:  CLARITIN Take 10 mg by mouth daily as needed  for allergies.   nortriptyline 10 MG capsule Commonly known as:  PAMELOR Take 10 mg by mouth 3 (three) times daily.   NUVIGIL 250 MG tablet Generic drug:  Armodafinil Take 250 mg by mouth daily.   pantoprazole 40 MG tablet Commonly known as:  PROTONIX Take 1 tablet (40 mg total) by mouth daily. For stomach   valsartan 160 MG tablet Commonly known as:  DIOVAN Take 1 tablet (160 mg total) by mouth daily. For Blood pressure        Follow-up: Return in about 6 months (around 08/16/2017).  Claretta Fraise, M.D.

## 2017-02-21 DIAGNOSIS — G894 Chronic pain syndrome: Secondary | ICD-10-CM | POA: Diagnosis not present

## 2017-02-21 DIAGNOSIS — M5489 Other dorsalgia: Secondary | ICD-10-CM | POA: Diagnosis not present

## 2017-02-21 DIAGNOSIS — Z79899 Other long term (current) drug therapy: Secondary | ICD-10-CM | POA: Diagnosis not present

## 2017-02-21 DIAGNOSIS — M47814 Spondylosis without myelopathy or radiculopathy, thoracic region: Secondary | ICD-10-CM | POA: Diagnosis not present

## 2017-02-21 DIAGNOSIS — M961 Postlaminectomy syndrome, not elsewhere classified: Secondary | ICD-10-CM | POA: Diagnosis not present

## 2017-02-21 DIAGNOSIS — Z79891 Long term (current) use of opiate analgesic: Secondary | ICD-10-CM | POA: Diagnosis not present

## 2017-03-05 DIAGNOSIS — Z0000 Encounter for general adult medical examination without abnormal findings: Secondary | ICD-10-CM | POA: Diagnosis not present

## 2017-03-05 DIAGNOSIS — Z6827 Body mass index (BMI) 27.0-27.9, adult: Secondary | ICD-10-CM | POA: Diagnosis not present

## 2017-03-05 DIAGNOSIS — R69 Illness, unspecified: Secondary | ICD-10-CM | POA: Diagnosis not present

## 2017-03-05 DIAGNOSIS — K219 Gastro-esophageal reflux disease without esophagitis: Secondary | ICD-10-CM | POA: Diagnosis not present

## 2017-03-05 DIAGNOSIS — R21 Rash and other nonspecific skin eruption: Secondary | ICD-10-CM | POA: Diagnosis not present

## 2017-03-05 DIAGNOSIS — I10 Essential (primary) hypertension: Secondary | ICD-10-CM | POA: Diagnosis not present

## 2017-03-05 DIAGNOSIS — Z79891 Long term (current) use of opiate analgesic: Secondary | ICD-10-CM | POA: Diagnosis not present

## 2017-03-05 DIAGNOSIS — M545 Low back pain: Secondary | ICD-10-CM | POA: Diagnosis not present

## 2017-03-07 DIAGNOSIS — F411 Generalized anxiety disorder: Secondary | ICD-10-CM | POA: Diagnosis not present

## 2017-03-07 DIAGNOSIS — G8921 Chronic pain due to trauma: Secondary | ICD-10-CM | POA: Diagnosis not present

## 2017-03-07 DIAGNOSIS — R69 Illness, unspecified: Secondary | ICD-10-CM | POA: Diagnosis not present

## 2017-03-22 DIAGNOSIS — G894 Chronic pain syndrome: Secondary | ICD-10-CM | POA: Diagnosis not present

## 2017-03-22 DIAGNOSIS — Z79899 Other long term (current) drug therapy: Secondary | ICD-10-CM | POA: Diagnosis not present

## 2017-03-22 DIAGNOSIS — Z79891 Long term (current) use of opiate analgesic: Secondary | ICD-10-CM | POA: Diagnosis not present

## 2017-03-22 DIAGNOSIS — M47814 Spondylosis without myelopathy or radiculopathy, thoracic region: Secondary | ICD-10-CM | POA: Diagnosis not present

## 2017-03-22 DIAGNOSIS — M5489 Other dorsalgia: Secondary | ICD-10-CM | POA: Diagnosis not present

## 2017-03-26 ENCOUNTER — Encounter: Payer: Self-pay | Admitting: Nurse Practitioner

## 2017-03-26 ENCOUNTER — Ambulatory Visit (INDEPENDENT_AMBULATORY_CARE_PROVIDER_SITE_OTHER): Payer: Medicare HMO | Admitting: Nurse Practitioner

## 2017-03-26 DIAGNOSIS — K5900 Constipation, unspecified: Secondary | ICD-10-CM | POA: Diagnosis not present

## 2017-03-26 DIAGNOSIS — K219 Gastro-esophageal reflux disease without esophagitis: Secondary | ICD-10-CM | POA: Diagnosis not present

## 2017-03-26 DIAGNOSIS — K5903 Drug induced constipation: Secondary | ICD-10-CM | POA: Insufficient documentation

## 2017-03-26 DIAGNOSIS — T402X5A Adverse effect of other opioids, initial encounter: Secondary | ICD-10-CM

## 2017-03-26 NOTE — Progress Notes (Signed)
Primary Care Physician:  Claretta Fraise, MD Primary Gastroenterologist:  Dr. Oneida Alar  Chief Complaint  Patient presents with  . Irritable Bowel Syndrome    C/o constipation x 4-5 months  . Gastroesophageal Reflux    at night    HPI:   Sarah Chapman is a 56 y.o. female who presents On referral from primary care for IBS symptoms. Patient was seen by primary care 01/19/2017 which point she noted abdominal pain for the previous month and was told it was due to fibromyalgia by her psychiatrist. Noted to the left upper quadrant, no change in bowel movements with regular soft stools. Colonoscopy last year was normal. Labs are ordered, started the patient on Linzess 145 g. The patient called primary care about 2 weeks later to say she is still having constipation despite taking Linzess. Referred to GI. Follow-up visit 02/16/2017 the patient said she had some moderate improvement with Linzess but thinks it is not strong enough, no she has a history of hiatal hernia keep status of her bed elevated however still with right upper quadrant abdominal pain. Noted daily bowel movement but feels incomplete and small. Sees chronic pain management but was denied opiates due to positive testing for cocaine. She is on hydrocodone. Deemed likely drug-induced constipation.  Today she states she's doing ok. Was taking Linzess but not very effective, not tried higher dose. Starting taking "something from the health food store" which is helping significantly, although cannot remember what it's called. However, she then states she has incomplete emptying. Denies hematochezia, melena. Had upper abdominal pain for 1-2 days a couple months ago, no ongoing pain. Has heartburn for months, described as nighttime (mostly) and has a sensation of "stuff coming up" and described as burning. Denies bitter taste. Already elevates the head of her bed. Also with sore throat after episodes. Denies N/V, fever, chills, unintentional weight  loss. Denies chest pain, dyspnea, dizziness, lightheadedness, syncope, near syncope. Denies any other upper or lower GI symptoms.  Had colonoscopy within the last 2 years, doesn't know who did it. Thinks it was behind [facility]  Past Medical History:  Diagnosis Date  . Allergic rhinitis   . Back pain   . Chronic headache   . COPD (chronic obstructive pulmonary disease) ([place])   . Emphysema of lung ([place])   . Hyperlipidemia   . Hypertension   . Neck pain   . Sleep apnea     Past Surgical History:  Procedure Laterality Date  . NECK SURGERY    . TONSILLECTOMY      Current Outpatient Prescriptions  Medication Sig Dispense Refill  . ALPRAZolam (XANAX) 0.5 MG tablet Take 1 tablet by mouth as needed.   2  . azelastine (OPTIVAR) 0.05 % ophthalmic solution PLACE 1 DROP INTO BOTH EYES EVERY 12 HOURS AS NEEDED FOR UP TO 30 DAYS (ITCHY OR REDDENED EYES)  5  . buPROPion (WELLBUTRIN) 75 MG tablet Take 75 mg by mouth 2 (two) times daily.    . cyclobenzaprine (FLEXERIL) 5 MG tablet Take 5 mg by mouth 3 (three) times daily as needed.     Marland Kitchen escitalopram (LEXAPRO) 20 MG tablet Take 20 mg by mouth daily.     Marland Kitchen HYDROcodone-acetaminophen (NORCO) 10-325 MG per tablet Take 1-2 tablets by mouth every 6 (six) hours as needed for moderate pain.   0  . HYSINGLA ER 30 MG T24A Take 1 tablet by mouth daily.     Marland Kitchen loratadine (CLARITIN) 10 MG tablet  Take 10 mg by mouth daily as needed for allergies.    Marland Kitchen nortriptyline (PAMELOR) 10 MG capsule Take 10 mg by mouth 3 (three) times daily.  1  . NUVIGIL 250 MG tablet Take 250 mg by mouth daily.  2  . OVER THE COUNTER MEDICATION Vitamin for bowels takes once daily Unsure of name    . pantoprazole (PROTONIX) 40 MG tablet Take 1 tablet (40 mg total) by mouth daily. For stomach 30 tablet 11  . valsartan (DIOVAN) 160 MG tablet Take 1 tablet (160 mg total) by mouth daily. For Blood pressure 90 tablet 3   No current facility-administered medications for this  visit.     Allergies as of 03/26/2017 - Review Complete 03/26/2017  Allergen Reaction Noted  . Clindamycin/lincomycin Rash 11/27/2012  . Penicillins  04/03/2014    Family History  Problem Relation Age of Onset  . Bone cancer Mother   . Colon cancer Brother 79    Social History   Social History  . Marital status: Divorced    Spouse name: N/A  . Number of children: N/A  . Years of education: N/A   Occupational History  . Not on file.   Social History Main Topics  . Smoking status: Former Smoker    Packs/day: 1.00    Years: 33.00    Types: Cigarettes    Quit date: 07/25/2016  . Smokeless tobacco: Never Used  . Alcohol use No  . Drug use: No  . Sexual activity: Not on file   Other Topics Concern  . Not on file   Social History Narrative  . No narrative on file    Review of Systems: Complete ROS negative except as per HPI.   Physical Exam: BP (!) 161/92   Pulse 80   Temp 97.8 F (36.6 C) (Oral)   Ht 5\' 5"  (1.651 m)   Wt 167 lb 9.6 oz (76 kg)   BMI 27.89 kg/m  General:   Alert and oriented. Pleasant and cooperative. Well-nourished and well-developed.  Eyes:  Without icterus, sclera clear and conjunctiva pink.  Ears:  Normal auditory acuity. Cardiovascular:  S1, S2 present without murmurs appreciated. Extremities without clubbing or edema. Respiratory:  Clear to auscultation bilaterally. No wheezes, rales, or rhonchi. No distress.  Gastrointestinal:  +BS, soft, and non-distended. No HSM noted. No guarding or rebound. No masses appreciated.  Rectal:  Deferred  Musculoskalatal:  Symmetrical without gross deformities. Neurologic:  Alert and oriented x4;  grossly normal neurologically. Psych:  Alert and cooperative. Normal mood and affect. Heme/Lymph/Immune: No excessive bruising noted.    03/26/2017 11:39 AM   Disclaimer: This note was dictated with voice recognition software. Similar sounding words can inadvertently be transcribed and may not be  corrected upon review.

## 2017-03-26 NOTE — Patient Instructions (Signed)
1. I am giving the samples of Movantik 25 mg for constipation. Take this once a day. 2. I am also giving the samples of Dexilant 60 mg. Take this once a day, on an empty stomach. 3. Stop taking Protonix while you were taking Dexilant. 4. We will try to track down your last colonoscopy results. 5. Return for follow-up in 2 months. 6. Call us if you have any questions or concerns.

## 2017-03-26 NOTE — Assessment & Plan Note (Signed)
The patient notes constipation. She initially told her primary care Linzess did not help. Then she told them it helped moderately. But she has since stopped taking it and tells me it was not working. At this point she is on hydrocodone and she could have drug-induced constipation. I will start her on Movantik 25 mg once a day and give her samples for a couple weeks and request a progress report in 1-2 weeks. She is taking some unknown supplement from the health food store which helps, but notes incomplete emptying. Likely her constipation is not well controlled. Colonoscopy up-to-date 1-2 years ago and we will attempt to get these records although she cannot remember who did it or exactly where it was done. Return for follow-up in 2 months.

## 2017-03-26 NOTE — Progress Notes (Signed)
cc'ed to pcp °

## 2017-03-26 NOTE — Assessment & Plan Note (Signed)
Patient describes GERD symptoms. Not currently on any medications. Has esophageal burning, already elevated status of her bed at night. I will switch her to Dexilant 60 mg daily. Request progress report in 1-2 weeks. Return for follow-up in 2 months.

## 2017-03-27 ENCOUNTER — Telehealth: Payer: Self-pay | Admitting: Gastroenterology

## 2017-03-27 NOTE — Telephone Encounter (Signed)
I spoke to pt and it was a CT scan, not MRI that Dr. Livia Snellen ordered. She got it mixed up. She has signed release for [place] to get previous records and they should be faxing anytime.

## 2017-03-27 NOTE — Telephone Encounter (Signed)
Pt saw EG yesterday and called this afternoon to say that she was out of it yesterday and forgot to tell EG some things.  She said that Dr Livia Snellen ordered a MRI and EG might want to look at that (epic) and she said that she sometimes sleeps with a C Pap machine and it causes her to get bloated and gassy. I told her that I was having a difficult time trying to get her last colonoscopy and she said she was heading to [place] and would get a copy and bring to [place].

## 2017-03-29 ENCOUNTER — Encounter: Payer: Self-pay | Admitting: Gastroenterology

## 2017-03-30 NOTE — Telephone Encounter (Signed)
I rechecked Epic and the only thing in there was a CT in September ordered by Dr. Livia Snellen. We can look at her last colonoscopy when we're able to get the report.

## 2017-04-03 NOTE — Telephone Encounter (Signed)
Yes, the patient got it herself and had it faxed to [place]. It should be in provider's box.

## 2017-04-03 NOTE — Telephone Encounter (Signed)
Sarah Chapman, this is in your box.

## 2017-04-03 NOTE — Telephone Encounter (Signed)
Sarah Chapman, did we get a fax on this pt's previous colonoscopy results?

## 2017-04-04 NOTE — Telephone Encounter (Signed)
Colonoscopy report received, reviewed, and marked for scanning.

## 2017-04-09 ENCOUNTER — Telehealth: Payer: Self-pay | Admitting: Gastroenterology

## 2017-04-09 NOTE — Telephone Encounter (Signed)
Pt has used her samples and needs a prescription for heart burn and a laxative called into [facility].

## 2017-04-10 NOTE — Telephone Encounter (Signed)
Pt needs Dexilant and Movantik sent to [facility].

## 2017-04-10 NOTE — Telephone Encounter (Signed)
LMOM to call.

## 2017-04-12 MED ORDER — NALOXEGOL OXALATE 25 MG PO TABS: 25.0000 mg | ORAL_TABLET | Freq: Every day | ORAL | 3 refills | Status: DC

## 2017-04-12 MED ORDER — DEXLANSOPRAZOLE 60 MG PO CPDR: 60.0000 mg | DELAYED_RELEASE_CAPSULE | Freq: Every day | ORAL | 3 refills | Status: DC

## 2017-04-12 NOTE — Telephone Encounter (Signed)
Please tell the patient Rx sent to pharmacy

## 2017-04-12 NOTE — Telephone Encounter (Signed)
LMOM that Rx's were sent in.

## 2017-04-12 NOTE — Addendum Note (Signed)
Addended by: Gordy Levan, Kayin Kettering A on: 04/12/2017 03:02 PM   Modules accepted: Orders

## 2017-04-13 ENCOUNTER — Encounter: Payer: Self-pay | Admitting: Nurse Practitioner

## 2017-04-13 ENCOUNTER — Ambulatory Visit (INDEPENDENT_AMBULATORY_CARE_PROVIDER_SITE_OTHER): Payer: Medicare HMO | Admitting: Nurse Practitioner

## 2017-04-13 VITALS — BP 149/91 | HR 80 | Temp 97.7°F | Ht 65.0 in | Wt 163.0 lb

## 2017-04-13 DIAGNOSIS — R350 Frequency of micturition: Secondary | ICD-10-CM

## 2017-04-13 DIAGNOSIS — N3001 Acute cystitis with hematuria: Secondary | ICD-10-CM

## 2017-04-13 LAB — URINALYSIS, COMPLETE
Bilirubin, UA: NEGATIVE
Glucose, UA: NEGATIVE
KETONES UA: NEGATIVE
NITRITE UA: NEGATIVE
PH UA: 5.5 (ref 5.0–7.5)
Specific Gravity, UA: <1.005 — ABNORMAL LOW (ref 1.005–1.030)
Urobilinogen, Ur: 0.2 mg/dL (ref 0.2–1.0)

## 2017-04-13 LAB — MICROSCOPIC EXAMINATION
RENAL EPITHEL UA: NONE SEEN /HPF
WBC, UA: >30 /hpf — AB (ref 0–?)

## 2017-04-13 MED ORDER — CIPROFLOXACIN HCL 500 MG PO TABS: 500.0000 mg | ORAL_TABLET | Freq: Two times a day (BID) | ORAL | 0 refills | Status: DC

## 2017-04-13 NOTE — Patient Instructions (Signed)

## 2017-04-13 NOTE — Progress Notes (Signed)
   Subjective:    Patient ID: Sarah Chapman, female    DOB: [birth year], 56 y.o.   MRN: [id number]  HPI Patient comes in today dysuria and urinary frequency. She says some tomes she pees just a little bit and other times she pees a lot. Having to get up 2-3 x at night to pee.    Review of Systems  Constitutional: Negative for chills and fever.  HENT: Negative.   Respiratory: Negative.   Gastrointestinal: Negative.   Genitourinary: Positive for dysuria, frequency, pelvic pain and urgency.  Neurological: Negative.   Psychiatric/Behavioral: Negative.   All other systems reviewed and are negative.      Objective:   Physical Exam  Constitutional: She is oriented to person, place, and time. She appears well-developed and well-nourished. No distress.  Cardiovascular: Normal rate and regular rhythm.  Pulmonary/Chest: Effort normal and breath sounds normal.  Abdominal: Soft. Bowel sounds are normal. There is tenderness (mild suprapubic pain on palpation).  Genitourinary:  Genitourinary Comments: No CVA tenderness  Neurological: She is alert and oriented to person, place, and time.  Skin: Skin is warm.  Psychiatric: She has a normal mood and affect. Her behavior is normal. Judgment and thought content normal.   BP (!) 149/91   Pulse 80   Temp 97.7 F (36.5 C) (Oral)   Ht 5\' 5"  (1.651 m)   Wt 163 lb (73.9 kg)   BMI 27.12 kg/m   UA - leuks- 2+ >30 WBC    Assessment & Plan:  1. Frequent urination - Urinalysis, Complete  2. Acute cystitis with hematuria Take medication as prescribe Cotton underwear Take shower not bath Cranberry juice, yogurt Force fluids AZO over the counter X2 days Culture pending RTO prn   - ciprofloxacin (CIPRO) 500 MG tablet; Take 1 tablet (500 mg total) 2 (two) times daily by mouth.  Dispense: 10 tablet; Refill: 0 - Urine Culture  Mary-Margaret Hassell Done, FNP

## 2017-04-16 LAB — URINE CULTURE

## 2017-04-23 ENCOUNTER — Encounter: Payer: Self-pay | Admitting: Family Medicine

## 2017-04-23 ENCOUNTER — Ambulatory Visit (INDEPENDENT_AMBULATORY_CARE_PROVIDER_SITE_OTHER): Payer: Medicare HMO | Admitting: Family Medicine

## 2017-04-23 ENCOUNTER — Telehealth: Payer: Self-pay | Admitting: Family Medicine

## 2017-04-23 VITALS — BP 147/90 | HR 77 | Temp 98.4°F | Ht 65.0 in | Wt 170.0 lb

## 2017-04-23 DIAGNOSIS — R300 Dysuria: Secondary | ICD-10-CM | POA: Diagnosis not present

## 2017-04-23 DIAGNOSIS — R059 Cough, unspecified: Secondary | ICD-10-CM

## 2017-04-23 DIAGNOSIS — R05 Cough: Secondary | ICD-10-CM | POA: Diagnosis not present

## 2017-04-23 LAB — URINALYSIS
Bilirubin, UA: NEGATIVE
Glucose, UA: NEGATIVE
KETONES UA: NEGATIVE
Leukocytes, UA: NEGATIVE
NITRITE UA: NEGATIVE
Protein, UA: NEGATIVE
RBC UA: NEGATIVE
SPEC GRAV UA: 1.02 (ref 1.005–1.030)
UUROB: 0.2 mg/dL (ref 0.2–1.0)
pH, UA: 5.5 (ref 5.0–7.5)

## 2017-04-23 MED ORDER — DOXYCYCLINE HYCLATE 100 MG PO TABS: 100.0000 mg | ORAL_TABLET | Freq: Two times a day (BID) | ORAL | 0 refills | Status: DC

## 2017-04-23 NOTE — Telephone Encounter (Signed)
Was seen on 11/16 dx with UTI from [facility]. Was given Cipro s/s were gone once she was on them. After she finished Thursday her s/s came back. Pt c/o frequency and urgency and pressure with Dysuria. Denies hematuria or fever. Pt uses [employer].

## 2017-04-23 NOTE — Progress Notes (Signed)
   HPI  Patient presents today here with a concern for UTI.  Patient states that she was treated with Cipro for UTI over a week ago.  She states a few days after the antibiotics were over she began to have similar pressure and dysuria build back up. She denies fever, chills, or sweats.  She has developed nasal congestion, head congestion, and cough similar to her friends that she was around a few days ago. She has had some mild wheezing. No change in back pain.  She is tolerating food and fluids like usual  PMH: Smoking status noted ROS: Per HPI  Objective: BP (!) 147/90   Pulse 77   Temp 98.4 F (36.9 C) (Oral)   Ht 5\' 5"  (1.651 m)   Wt 170 lb (77.1 kg)   BMI 28.29 kg/m  Gen: NAD, alert, cooperative with exam HEENT: NCAT, EOMI, PERRL CV: RRR, good S1/S2, no murmur Resp: CTABL, no wheezes, non-labored Abd: Positive suprapubic tenderness, no CVA tenderness Ext: No edema, warm Neuro: Alert and oriented, No gross deficits  Assessment and plan:  #Dysuria Likely recurrent UTI, Cipro given test visit Doxycycline should treat well based on her last urine culture and this would also have good respiratory coverage  #Cough Reassurance provided, doxycycline started Exam is benign. Possible viral etiology, however if bacterial will be treated well   Orders Placed This Encounter  Procedures  . Urinalysis    Meds ordered this encounter  Medications  . doxycycline (VIBRA-TABS) 100 MG tablet    Sig: Take 1 tablet (100 mg total) by mouth 2 (two) times daily. 1 po bid    Dispense:  20 tablet    Refill:  0    Laroy Apple, MD [facility] 04/23/2017, 5:33 PM

## 2017-04-23 NOTE — Patient Instructions (Addendum)
Great to see you!   Urinary Tract Infection, Adult A urinary tract infection (UTI) is an infection of any part of the urinary tract, which includes the kidneys, ureters, bladder, and urethra. These organs make, store, and get rid of urine in the body. UTI can be a bladder infection (cystitis) or kidney infection (pyelonephritis). What are the causes? This infection may be caused by fungi, viruses, or bacteria. Bacteria are the most common cause of UTIs. This condition can also be caused by repeated incomplete emptying of the bladder during urination. What increases the risk? This condition is more likely to develop if:  You ignore your need to urinate or hold urine for long periods of time.  You do not empty your bladder completely during urination.  You wipe back to front after urinating or having a bowel movement, if you are female.  You are uncircumcised, if you are female.  You are constipated.  You have a urinary catheter that stays in place (indwelling).  You have a weak defense (immune) system.  You have a medical condition that affects your bowels, kidneys, or bladder.  You have diabetes.  You take antibiotic medicines frequently or for long periods of time, and the antibiotics no longer work well against certain types of infections (antibiotic resistance).  You take medicines that irritate your urinary tract.  You are exposed to chemicals that irritate your urinary tract.  You are female.  What are the signs or symptoms? Symptoms of this condition include:  Fever.  Frequent urination or passing small amounts of urine frequently.  Needing to urinate urgently.  Pain or burning with urination.  Urine that smells bad or unusual.  Cloudy urine.  Pain in the lower abdomen or back.  Trouble urinating.  Blood in the urine.  Vomiting or being less hungry than normal.  Diarrhea or abdominal pain.  Vaginal discharge, if you are female.  How is this  diagnosed? This condition is diagnosed with a medical history and physical exam. You will also need to provide a urine sample to test your urine. Other tests may be done, including:  Blood tests.  Sexually transmitted disease (STD) testing.  If you have had more than one UTI, a cystoscopy or imaging studies may be done to determine the cause of the infections. How is this treated? Treatment for this condition often includes a combination of two or more of the following:  Antibiotic medicine.  Other medicines to treat less common causes of UTI.  Over-the-counter medicines to treat pain.  Drinking enough water to stay hydrated.  Follow these instructions at home:  Take over-the-counter and prescription medicines only as told by your health care provider.  If you were prescribed an antibiotic, take it as told by your health care provider. Do not stop taking the antibiotic even if you start to feel better.  Avoid alcohol, caffeine, tea, and carbonated beverages. They can irritate your bladder.  Drink enough fluid to keep your urine clear or pale yellow.  Keep all follow-up visits as told by your health care provider. This is important.  Make sure to: ? Empty your bladder often and completely. Do not hold urine for long periods of time. ? Empty your bladder before and after sex. ? Wipe from front to back after a bowel movement if you are female. Use each tissue one time when you wipe. Contact a health care provider if:  You have back pain.  You have a fever.  You feel nauseous or   vomit.  Your symptoms do not get better after 3 days.  Your symptoms go away and then return. Get help right away if:  You have severe back pain or lower abdominal pain.  You are vomiting and cannot keep down any medicines or water. This information is not intended to replace advice given to you by your health care provider. Make sure you discuss any questions you have with your health care  provider. Document Released: 02/22/2005 Document Revised: 10/27/2015 Document Reviewed: 04/05/2015 Elsevier Interactive Patient Education  2017 [employer].

## 2017-04-23 NOTE — Telephone Encounter (Signed)
Pt c/o Dysuria after completing antibiotics. Pt given appt in afterhours clinic today at 5:15.

## 2017-04-25 DIAGNOSIS — M961 Postlaminectomy syndrome, not elsewhere classified: Secondary | ICD-10-CM | POA: Diagnosis not present

## 2017-04-25 DIAGNOSIS — Z79899 Other long term (current) drug therapy: Secondary | ICD-10-CM | POA: Diagnosis not present

## 2017-04-25 DIAGNOSIS — M5489 Other dorsalgia: Secondary | ICD-10-CM | POA: Diagnosis not present

## 2017-04-25 DIAGNOSIS — M47814 Spondylosis without myelopathy or radiculopathy, thoracic region: Secondary | ICD-10-CM | POA: Diagnosis not present

## 2017-04-25 DIAGNOSIS — G894 Chronic pain syndrome: Secondary | ICD-10-CM | POA: Diagnosis not present

## 2017-04-25 DIAGNOSIS — Z79891 Long term (current) use of opiate analgesic: Secondary | ICD-10-CM | POA: Diagnosis not present

## 2017-04-25 LAB — URINE CULTURE

## 2017-04-26 DIAGNOSIS — R69 Illness, unspecified: Secondary | ICD-10-CM | POA: Diagnosis not present

## 2017-04-26 DIAGNOSIS — G8921 Chronic pain due to trauma: Secondary | ICD-10-CM | POA: Diagnosis not present

## 2017-04-26 DIAGNOSIS — F411 Generalized anxiety disorder: Secondary | ICD-10-CM | POA: Diagnosis not present

## 2017-05-09 ENCOUNTER — Ambulatory Visit: Payer: Self-pay | Admitting: Gastroenterology

## 2017-05-16 ENCOUNTER — Ambulatory Visit: Payer: Medicare HMO | Admitting: Gastroenterology

## 2017-05-16 ENCOUNTER — Encounter: Payer: Self-pay | Admitting: Gastroenterology

## 2017-05-16 DIAGNOSIS — K5903 Drug induced constipation: Secondary | ICD-10-CM

## 2017-05-16 DIAGNOSIS — T402X5A Adverse effect of other opioids, initial encounter: Secondary | ICD-10-CM | POA: Diagnosis not present

## 2017-05-16 DIAGNOSIS — K219 Gastro-esophageal reflux disease without esophagitis: Secondary | ICD-10-CM

## 2017-05-16 NOTE — Patient Instructions (Signed)
DRINK WATER TO KEEP YOUR URINE LIGHT YELLOW.  FOLLOW A HIGH FIBER DIET. AVOID ITEMS THAT CAUSE BLOATING & GAS. SEE INFO BELOW. CONTINUE YOUR WEIGHT LOSS EFFORTS.  USE FIBER POWDER PILLS ONCE DAILY FOR 3 DAYS THEN TWICE DAILY.INCREASE UP TO THREE TIMES A DAY IF NEEDED.  USE MILK OF MAGNESIA PILLS OR LIQUID UP TO THREE TIMES A DAY TO COMPLETELY EMPTY YOUR COLON.  PLEASE CALL WITH QUESTIONS OR CONCERNS.  FOLLOW UP IN 4 MOS.   High-Fiber Diet A high-fiber diet changes your normal diet to include more whole grains, legumes, fruits, and vegetables. Changes in the diet involve replacing refined carbohydrates with unrefined foods. The calorie level of the diet is essentially unchanged. The Dietary Reference Intake (recommended amount) for adult males is 38 grams per day. For adult females, it is 25 grams per day. Pregnant and lactating women should consume 28 grams of fiber per day.Fiber is the intact part of a plant that is not broken down during digestion. Functional fiber is fiber that has been isolated from the plant to provide a beneficial effect in the body.  PURPOSE  Increase stool bulk.   Ease and regulate bowel movements.   Lower cholesterol.   REDUCE RISK OF COLON CANCER  INDICATIONS THAT YOU NEED MORE FIBER  Constipation and hemorrhoids.   Uncomplicated diverticulosis (intestine condition) and irritable bowel syndrome.   Weight management.   As a protective measure against hardening of the arteries (atherosclerosis), diabetes, and cancer.   GUIDELINES FOR INCREASING FIBER IN THE DIET  Start adding fiber to the diet slowly. A gradual increase of about 5 more grams (2 slices of whole-wheat bread, 2 servings of most fruits or vegetables, or 1 bowl of high-fiber cereal) per day is best. Too rapid an increase in fiber may result in constipation, flatulence, and bloating.   Drink enough water and fluids to keep your urine clear or pale yellow. Water, juice, or caffeine-free  drinks are recommended. Not drinking enough fluid may cause constipation.   Eat a variety of high-fiber foods rather than one type of fiber.   Try to increase your intake of fiber through using high-fiber foods rather than fiber pills or supplements that contain small amounts of fiber.   The goal is to change the types of food eaten. Do not supplement your present diet with high-fiber foods, but replace foods in your present diet.   INCLUDE A VARIETY OF FIBER SOURCES  Replace refined and processed grains with whole grains, canned fruits with fresh fruits, and incorporate other fiber sources. White rice, white breads, and most bakery goods contain little or no fiber.   Brown whole-grain rice, buckwheat oats, and many fruits and vegetables are all good sources of fiber. These include: broccoli, Brussels sprouts, cabbage, cauliflower, beets, sweet potatoes, white potatoes (skin on), carrots, tomatoes, eggplant, squash, berries, fresh fruits, and dried fruits.   Cereals appear to be the richest source of fiber. Cereal fiber is found in whole grains and bran. Bran is the fiber-rich outer coat of cereal grain, which is largely removed in refining. In whole-grain cereals, the bran remains. In breakfast cereals, the largest amount of fiber is found in those with "bran" in their names. The fiber content is sometimes indicated on the label.   You may need to include additional fruits and vegetables each day.   In baking, for 1 cup white flour, you may use the following substitutions:   1 cup whole-wheat flour minus 2 tablespoons.   1/2  cup white flour plus 1/2 cup whole-wheat flour.

## 2017-05-16 NOTE — Assessment & Plan Note (Signed)
SYMPTOMS FAIRLY WELL CONTROLLED.  CONTINUE TO MONITOR SYMPTOMS. CONTINUE DEXILANT. FOLLOW UP IN 4 MOS.
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
## 2017-05-16 NOTE — Progress Notes (Signed)
cc'd to pcp 

## 2017-05-16 NOTE — Progress Notes (Signed)
ON RECALL  °

## 2017-05-16 NOTE — Assessment & Plan Note (Signed)
SYMPTOMS NOT IDEALLY CONTROLLED ON MOVANTIK 25 MG DAILY.  DISCUSSED PROCEDURE, BENEFITS, RISKS, AND MANAGEMENT OF CONSTIPATION: 1. MEDICAL MANAGEMENT OR 2. SURGERY. PT NOT INTERESTED IN SURGERY AT THIS TIME. DRINK WATER TO KEEP YOUR URINE LIGHT YELLOW.  FOLLOW A HIGH FIBER DIET. AVOID ITEMS THAT CAUSE BLOATING & GAS.  HANDOUT GIVEN. CONTINUE YOUR WEIGHT LOSS EFFORTS. USE FIBER POWDER PILLS ONCE DAILY FOR 3 DAYS THEN TWICE DAILY.INCREASE UP TO THREE TIMES A DAY IF NEEDED. USE MILK OF MAGNESIA PILLS OR LIQUID UP TO THREE TIMES A DAY TO COMPLETELY EMPTY YOUR COLON. PLEASE CALL WITH QUESTIONS OR CONCERNS.  FOLLOW UP IN 4 MOS.

## 2017-06-04 DIAGNOSIS — M5489 Other dorsalgia: Secondary | ICD-10-CM | POA: Diagnosis not present

## 2017-06-04 DIAGNOSIS — M47814 Spondylosis without myelopathy or radiculopathy, thoracic region: Secondary | ICD-10-CM | POA: Diagnosis not present

## 2017-06-04 DIAGNOSIS — G894 Chronic pain syndrome: Secondary | ICD-10-CM | POA: Diagnosis not present

## 2017-06-04 DIAGNOSIS — M961 Postlaminectomy syndrome, not elsewhere classified: Secondary | ICD-10-CM | POA: Diagnosis not present

## 2017-06-07 DIAGNOSIS — R69 Illness, unspecified: Secondary | ICD-10-CM | POA: Diagnosis not present

## 2017-06-07 DIAGNOSIS — F411 Generalized anxiety disorder: Secondary | ICD-10-CM | POA: Diagnosis not present

## 2017-06-07 DIAGNOSIS — G8921 Chronic pain due to trauma: Secondary | ICD-10-CM | POA: Diagnosis not present

## 2017-06-22 DIAGNOSIS — Z1231 Encounter for screening mammogram for malignant neoplasm of breast: Secondary | ICD-10-CM | POA: Diagnosis not present

## 2017-07-03 DIAGNOSIS — Z79891 Long term (current) use of opiate analgesic: Secondary | ICD-10-CM | POA: Diagnosis not present

## 2017-07-03 DIAGNOSIS — G894 Chronic pain syndrome: Secondary | ICD-10-CM | POA: Diagnosis not present

## 2017-07-03 DIAGNOSIS — M961 Postlaminectomy syndrome, not elsewhere classified: Secondary | ICD-10-CM | POA: Diagnosis not present

## 2017-07-03 DIAGNOSIS — G8921 Chronic pain due to trauma: Secondary | ICD-10-CM | POA: Diagnosis not present

## 2017-07-03 DIAGNOSIS — F411 Generalized anxiety disorder: Secondary | ICD-10-CM | POA: Diagnosis not present

## 2017-07-03 DIAGNOSIS — M5489 Other dorsalgia: Secondary | ICD-10-CM | POA: Diagnosis not present

## 2017-07-03 DIAGNOSIS — R69 Illness, unspecified: Secondary | ICD-10-CM | POA: Diagnosis not present

## 2017-07-03 DIAGNOSIS — M47814 Spondylosis without myelopathy or radiculopathy, thoracic region: Secondary | ICD-10-CM | POA: Diagnosis not present

## 2017-07-03 DIAGNOSIS — Z79899 Other long term (current) drug therapy: Secondary | ICD-10-CM | POA: Diagnosis not present

## 2017-07-25 ENCOUNTER — Encounter: Payer: Self-pay | Admitting: Gastroenterology

## 2017-07-31 DIAGNOSIS — G894 Chronic pain syndrome: Secondary | ICD-10-CM | POA: Diagnosis not present

## 2017-07-31 DIAGNOSIS — M47812 Spondylosis without myelopathy or radiculopathy, cervical region: Secondary | ICD-10-CM | POA: Diagnosis not present

## 2017-07-31 DIAGNOSIS — M25569 Pain in unspecified knee: Secondary | ICD-10-CM | POA: Diagnosis not present

## 2017-07-31 DIAGNOSIS — M47814 Spondylosis without myelopathy or radiculopathy, thoracic region: Secondary | ICD-10-CM | POA: Diagnosis not present

## 2017-07-31 DIAGNOSIS — Z79899 Other long term (current) drug therapy: Secondary | ICD-10-CM | POA: Diagnosis not present

## 2017-07-31 DIAGNOSIS — Z79891 Long term (current) use of opiate analgesic: Secondary | ICD-10-CM | POA: Diagnosis not present

## 2017-08-03 ENCOUNTER — Other Ambulatory Visit (INDEPENDENT_AMBULATORY_CARE_PROVIDER_SITE_OTHER): Payer: Medicare HMO

## 2017-08-03 ENCOUNTER — Other Ambulatory Visit: Payer: Self-pay | Admitting: Family Medicine

## 2017-08-03 DIAGNOSIS — M25521 Pain in right elbow: Secondary | ICD-10-CM | POA: Diagnosis not present

## 2017-08-03 DIAGNOSIS — M25562 Pain in left knee: Secondary | ICD-10-CM | POA: Diagnosis not present

## 2017-08-03 DIAGNOSIS — M25421 Effusion, right elbow: Secondary | ICD-10-CM

## 2017-08-03 DIAGNOSIS — M170 Bilateral primary osteoarthritis of knee: Secondary | ICD-10-CM | POA: Diagnosis not present

## 2017-08-07 ENCOUNTER — Ambulatory Visit (INDEPENDENT_AMBULATORY_CARE_PROVIDER_SITE_OTHER): Payer: Medicare HMO | Admitting: Family Medicine

## 2017-08-07 ENCOUNTER — Encounter: Payer: Self-pay | Admitting: Family Medicine

## 2017-08-07 VITALS — BP 144/80 | HR 85 | Temp 97.8°F | Ht 65.0 in | Wt 174.4 lb

## 2017-08-07 DIAGNOSIS — I10 Essential (primary) hypertension: Secondary | ICD-10-CM

## 2017-08-07 MED ORDER — FLUOCINONIDE EMULSIFIED BASE 0.05 % EX CREA: TOPICAL_CREAM | CUTANEOUS | 0 refills | Status: DC

## 2017-08-07 MED ORDER — OLMESARTAN MEDOXOMIL 40 MG PO TABS: 40.0000 mg | ORAL_TABLET | Freq: Every day | ORAL | 2 refills | Status: DC

## 2017-08-07 NOTE — Progress Notes (Signed)
Subjective:  Patient ID: Sarah Chapman, female    DOB: Dec 20, 1960  Age: 57 y.o. MRN: [id number]  CC: Blood Pressure Check   HPI Sarah Chapman presents for  follow-up of hypertension. Patient has no history of headache chest pain or shortness of breath or recent cough. Patient also denies symptoms of TIA such as focal numbness or weakness. Patient checks  blood pressure at home. Readings recently written in her log book show systolics in the 009-381 range on most checks.. Patient denies side effects from medication. States taking it regularly.  She is heard about it being recalled and thought that was due to it not being effective therefore she is concerned that she needs to switch the Diovan to something else.   History Sarah Chapman has a past medical history of Allergic rhinitis, Back pain, Chronic headache, COPD (chronic obstructive pulmonary disease) ([place]), Emphysema of lung ([place]), Hyperlipidemia, Hypertension, Neck pain, and Sleep apnea.   She has a past surgical history that includes Neck surgery and Tonsillectomy.   Her family history includes Bone cancer in her mother; Colon cancer (age of onset: 29) in her brother.She reports that she quit smoking about a year ago. Her smoking use included cigarettes. She has a 33.00 pack-year smoking history. she has never used smokeless tobacco. She reports that she does not drink alcohol or use drugs.  Current Outpatient Medications on File Prior to Visit  Medication Sig Dispense Refill  . ALPRAZolam (XANAX) 0.5 MG tablet Take 1 tablet by mouth as needed.   2  . azelastine (OPTIVAR) 0.05 % ophthalmic solution PLACE 1 DROP INTO BOTH EYES EVERY 12 HOURS AS NEEDED FOR UP TO 30 DAYS (ITCHY OR REDDENED EYES)  5  . buPROPion (WELLBUTRIN) 75 MG tablet Take 75 mg by mouth 2 (two) times daily.    . cyclobenzaprine (FLEXERIL) 5 MG tablet Take 5 mg by mouth 3 (three) times daily as needed.     Marland Kitchen dexlansoprazole (DEXILANT) 60 MG capsule Take 1 capsule (60 mg total) daily  by mouth. 30 capsule 3  . escitalopram (LEXAPRO) 20 MG tablet Take 20 mg by mouth daily.     Marland Kitchen gabapentin (NEURONTIN) 600 MG tablet Take 1 tablet by mouth as needed.    Marland Kitchen HYDROcodone-acetaminophen (NORCO) 10-325 MG per tablet Take 1-2 tablets by mouth every 6 (six) hours as needed for moderate pain.   0  . HYSINGLA ER 30 MG T24A Take 1 tablet by mouth daily.     Marland Kitchen loratadine (CLARITIN) 10 MG tablet Take 10 mg by mouth daily as needed for allergies.    . naloxegol oxalate (MOVANTIK) 25 MG TABS tablet Take 1 tablet (25 mg total) daily by mouth. 30 tablet 3  . NUVIGIL 250 MG tablet Take 250 mg by mouth daily.  2  . OVER THE COUNTER MEDICATION Vitamin for bowels takes once daily Unsure of name     No current facility-administered medications on file prior to visit.     ROS Review of Systems  Constitutional: Negative for activity change, appetite change and fever.  HENT: Negative for congestion, rhinorrhea and sore throat.   Eyes: Negative for visual disturbance.  Respiratory: Negative for cough and shortness of breath.   Cardiovascular: Negative for chest pain and palpitations.  Gastrointestinal: Negative.   Genitourinary: Negative.   Musculoskeletal: Positive for arthralgias, back pain and myalgias.    Objective:  BP (!) 144/80   Pulse 85   Temp 97.8 F (36.6 C) (Oral)  Ht 5\' 5"  (1.651 m)   Wt 174 lb 6 oz (79.1 kg)   BMI 29.02 kg/m   BP Readings from Last 3 Encounters:  08/07/17 (!) 144/80  05/16/17 139/84  04/23/17 (!) 147/90    Wt Readings from Last 3 Encounters:  08/07/17 174 lb 6 oz (79.1 kg)  05/16/17 173 lb (78.5 kg)  04/23/17 170 lb (77.1 kg)     Physical Exam  Constitutional: She is oriented to person, place, and time. She appears well-developed and well-nourished. No distress.  HENT:  Head: Normocephalic and atraumatic.  Eyes: Conjunctivae are normal. Pupils are equal, round, and reactive to light.  Neck: Normal range of motion. Neck supple. No thyromegaly  present.  Cardiovascular: Normal rate, regular rhythm and normal heart sounds.  No murmur heard. Pulmonary/Chest: Effort normal and breath sounds normal. No respiratory distress. She has no wheezes. She has no rales.  Abdominal: Soft. She exhibits no distension.  Musculoskeletal: Normal range of motion.  Lymphadenopathy:    She has no cervical adenopathy.  Neurological: She is alert and oriented to person, place, and time.  Skin: Skin is warm and dry.  Psychiatric: She has a normal mood and affect. Her behavior is normal. Judgment and thought content normal.      Assessment & Plan:   Sarah Chapman was seen today for blood pressure check.  Diagnoses and all orders for this visit:  Uncontrolled stage 2 hypertension  Other orders -     olmesartan (BENICAR) 40 MG tablet; Take 1 tablet (40 mg total) by mouth daily. -     Fluocinonide Emulsified Base 0.05 % CREA; Apply to area on abdomen twice daily for 2 weeks   Allergies as of 08/07/2017      Reactions   Clindamycin/lincomycin Rash   Penicillins    Unknown      Medication List        Accurate as of 08/07/17  5:28 PM. Always use your most recent med list.          ALPRAZolam 0.5 MG tablet Commonly known as:  XANAX Take 1 tablet by mouth as needed.   azelastine 0.05 % ophthalmic solution Commonly known as:  OPTIVAR PLACE 1 DROP INTO BOTH EYES EVERY 12 HOURS AS NEEDED FOR UP TO 30 DAYS (ITCHY OR REDDENED EYES)   buPROPion 75 MG tablet Commonly known as:  WELLBUTRIN Take 75 mg by mouth 2 (two) times daily.   cyclobenzaprine 5 MG tablet Commonly known as:  FLEXERIL Take 5 mg by mouth 3 (three) times daily as needed.   dexlansoprazole 60 MG capsule Commonly known as:  DEXILANT Take 1 capsule (60 mg total) daily by mouth.   escitalopram 20 MG tablet Commonly known as:  LEXAPRO Take 20 mg by mouth daily.   Fluocinonide Emulsified Base 0.05 % Crea Apply to area on abdomen twice daily for 2 weeks   gabapentin 600 MG  tablet Commonly known as:  NEURONTIN Take 1 tablet by mouth as needed.   HYDROcodone-acetaminophen 10-325 MG tablet Commonly known as:  NORCO Take 1-2 tablets by mouth every 6 (six) hours as needed for moderate pain.   HYSINGLA ER 30 MG T24a Generic drug:  HYDROcodone Bitartrate ER Take 1 tablet by mouth daily.   loratadine 10 MG tablet Commonly known as:  CLARITIN Take 10 mg by mouth daily as needed for allergies.   naloxegol oxalate 25 MG Tabs tablet Commonly known as:  MOVANTIK Take 1 tablet (25 mg total) daily by mouth.  NUVIGIL 250 MG tablet Generic drug:  Armodafinil Take 250 mg by mouth daily.   olmesartan 40 MG tablet Commonly known as:  BENICAR Take 1 tablet (40 mg total) by mouth daily.   OVER THE COUNTER MEDICATION Vitamin for bowels takes once daily Unsure of name       Meds ordered this encounter  Medications  . olmesartan (BENICAR) 40 MG tablet    Sig: Take 1 tablet (40 mg total) by mouth daily.    Dispense:  30 tablet    Refill:  2  . Fluocinonide Emulsified Base 0.05 % CREA    Sig: Apply to area on abdomen twice daily for 2 weeks    Dispense:  15 g    Refill:  0    Patient is a chronic pain patient as well.  She expresses concern about having to see multiple specialists.  We had a significant discussion regarding the interaction of benzodiazepines and opiates and she seems to understand the need to have this regulated by a pain specialist at this point.  Follow-up: Return in about 3 weeks (around 08/28/2017).  Claretta Fraise, M.D.

## 2017-08-17 ENCOUNTER — Ambulatory Visit (INDEPENDENT_AMBULATORY_CARE_PROVIDER_SITE_OTHER): Payer: Medicare HMO | Admitting: Pharmacist Clinician (PhC)/ Clinical Pharmacy Specialist

## 2017-08-17 VITALS — BP 138/78

## 2017-08-17 DIAGNOSIS — M810 Age-related osteoporosis without current pathological fracture: Secondary | ICD-10-CM | POA: Diagnosis not present

## 2017-08-17 NOTE — Progress Notes (Signed)
Patient is referred to go over dexascan results and treatment of osteoporosis.  Patient states she has a 33 year history of smoking, quit 1 year ago.  She has little knowledge of family medical history but remembers her maternal grandfather being hunched over.  Patient went through menopause early at age 57 and did not take HRT.  She gets around 1000mg  of dietary calcium and about 400IU of vitamin D.  Serum calcium is normal but no vitamin D level is in her chart.  She was thin until her 30's and has fair skin and blue eyes.  Has minimal caffeine intake since quitting smoking.    A/P:  1.  Osteoporosis based on Hip T-score of - 2.6.  2.  History of GI problems that are difficult to ascertain based on patient's oral history today and medical notes.  She states that recently constipation from pain meds has been her primary problem with no GERD symptoms.  However, she is taking Dexilant.  Recent GI note has no mention of ulcers, gastiritis, etc.  3.  Bisphosphonates are first line treatment for osteoporosis, however I need to obtain a better idea of her GI risk.  Actonel monthly would be my choice for her if GI risk is minimal.  4.  Obtain vitamin D level with next labs.  Add Vitamin D 800IU daily at this time.  Will avoid calcium until constipation resolves since daily diet is high in calcium.  5.  If patient cannot take bispohosphonates, would recommend Prolia.  6.  Fall prevention covered.  Total time spent counseling patient 40 minutes  Memory Argue, PharmD, CPP, FNLA

## 2017-08-22 DIAGNOSIS — G8921 Chronic pain due to trauma: Secondary | ICD-10-CM | POA: Diagnosis not present

## 2017-08-22 DIAGNOSIS — R69 Illness, unspecified: Secondary | ICD-10-CM | POA: Diagnosis not present

## 2017-08-22 DIAGNOSIS — F411 Generalized anxiety disorder: Secondary | ICD-10-CM | POA: Diagnosis not present

## 2017-08-28 ENCOUNTER — Ambulatory Visit: Payer: Medicare HMO | Admitting: Family Medicine

## 2017-08-28 DIAGNOSIS — M47814 Spondylosis without myelopathy or radiculopathy, thoracic region: Secondary | ICD-10-CM | POA: Diagnosis not present

## 2017-08-28 DIAGNOSIS — Z79899 Other long term (current) drug therapy: Secondary | ICD-10-CM | POA: Diagnosis not present

## 2017-08-28 DIAGNOSIS — Z79891 Long term (current) use of opiate analgesic: Secondary | ICD-10-CM | POA: Diagnosis not present

## 2017-08-28 DIAGNOSIS — G894 Chronic pain syndrome: Secondary | ICD-10-CM | POA: Diagnosis not present

## 2017-08-29 ENCOUNTER — Encounter: Payer: Self-pay | Admitting: Family Medicine

## 2017-08-29 ENCOUNTER — Ambulatory Visit (INDEPENDENT_AMBULATORY_CARE_PROVIDER_SITE_OTHER): Payer: Medicare HMO | Admitting: Family Medicine

## 2017-08-29 VITALS — BP 123/64 | HR 89 | Ht 65.0 in | Wt 174.1 lb

## 2017-08-29 DIAGNOSIS — J439 Emphysema, unspecified: Secondary | ICD-10-CM | POA: Diagnosis not present

## 2017-08-29 DIAGNOSIS — G43711 Chronic migraine without aura, intractable, with status migrainosus: Secondary | ICD-10-CM

## 2017-08-29 DIAGNOSIS — I10 Essential (primary) hypertension: Secondary | ICD-10-CM

## 2017-08-29 MED ORDER — FLUTICASONE FUROATE-VILANTEROL 200-25 MCG/INH IN AEPB: 1.0000 | INHALATION_SPRAY | Freq: Every day | RESPIRATORY_TRACT | 2 refills | Status: DC

## 2017-08-29 MED ORDER — TOPIRAMATE 25 MG PO TABS
ORAL_TABLET | ORAL | 0 refills | Status: DC
Start: 2017-08-29 — End: 2017-09-28

## 2017-08-29 NOTE — Progress Notes (Signed)
Subjective:  Patient ID: Sarah Chapman, female    DOB: 06/26/1960  Age: 57 y.o. MRN: [id number]  CC: Hypertension (pt here today for follow up on her BP after starting Benicar )   HPI Sarah Chapman presents for  follow-up of hypertension. Patient has no history of headache chest pain or shortness of breath or recent cough. Patient also denies symptoms of TIA such as focal numbness or weakness. Patient checks  blood pressure at home. Readings recently have been good.. Patient denies side effects from medication. States taking it regularly.  Patient's bigger concern today is that she continues to have intermittent headaches.  Currently she has had one for 3 days.  She says she awakens with them.  She has one that severe about once a month and it will last about 4 hours.  Pain can range from 5-7 with current headache.  When it severe it can be 8-9/10.  She will end up taking more of her pain pills and she is ending up running out early.  She is has a history of migraine but this is different from the past migraines and that these symptoms start from the occiput and come forward.  They tend to be bilateral.  There is some photophobia.  Patient reports that she is also having more shortness of breath and to her current inhalers not helping.  She needs something to help relieve her dyspnea on exertion and occasional wheezing.  She is not having severe attacks she has not had to go to the emergency room. History Sarah Chapman has a past medical history of Allergic rhinitis, Back pain, Chronic headache, COPD (chronic obstructive pulmonary disease) ([place]), Emphysema of lung ([place]), Hyperlipidemia, Hypertension, Neck pain, and Sleep apnea.   She has a past surgical history that includes Neck surgery and Tonsillectomy.   Her family history includes Bone cancer in her mother; Colon cancer (age of onset: 71) in her brother.She reports that she quit smoking about 13 months ago. Her smoking use included cigarettes. She has a  33.00 pack-year smoking history. She has never used smokeless tobacco. She reports that she does not drink alcohol or use drugs.  Current Outpatient Medications on File Prior to Visit  Medication Sig Dispense Refill  . ALPRAZolam (XANAX) 0.5 MG tablet Take 1 tablet by mouth as needed.   2  . azelastine (OPTIVAR) 0.05 % ophthalmic solution PLACE 1 DROP INTO BOTH EYES EVERY 12 HOURS AS NEEDED FOR UP TO 30 DAYS (ITCHY OR REDDENED EYES)  5  . buPROPion (WELLBUTRIN) 75 MG tablet Take 75 mg by mouth 2 (two) times daily.    . cyclobenzaprine (FLEXERIL) 5 MG tablet Take 5 mg by mouth 3 (three) times daily as needed.     Marland Kitchen dexlansoprazole (DEXILANT) 60 MG capsule Take 1 capsule (60 mg total) daily by mouth. 30 capsule 3  . escitalopram (LEXAPRO) 20 MG tablet Take 20 mg by mouth daily.     . Fluocinonide Emulsified Base 0.05 % CREA Apply to area on abdomen twice daily for 2 weeks 15 g 0  . gabapentin (NEURONTIN) 600 MG tablet Take 1 tablet by mouth as needed.    Marland Kitchen HYDROcodone-acetaminophen (NORCO) 10-325 MG per tablet Take 1-2 tablets by mouth every 6 (six) hours as needed for moderate pain.   0  . loratadine (CLARITIN) 10 MG tablet Take 10 mg by mouth daily as needed for allergies.    Marland Kitchen NUVIGIL 250 MG tablet Take 250 mg by mouth daily.  2  . olmesartan (BENICAR) 40 MG tablet Take 1 tablet (40 mg total) by mouth daily. 30 tablet 2  . OVER THE COUNTER MEDICATION Vitamin for bowels takes once daily Unsure of name     No current facility-administered medications on file prior to visit.     ROS Review of Systems  Constitutional: Negative.   HENT: Negative for congestion.   Eyes: Negative for visual disturbance.  Respiratory: Positive for shortness of breath.   Cardiovascular: Negative for chest pain.  Gastrointestinal: Negative for abdominal pain, constipation, diarrhea, nausea and vomiting.  Genitourinary: Negative for difficulty urinating.  Musculoskeletal: Negative for arthralgias and myalgias.    Neurological: Positive for headaches.  Psychiatric/Behavioral: Negative for sleep disturbance.    Objective:  BP 123/64   Pulse 89   Ht 5\' 5"  (1.651 m)   Wt 174 lb 2 oz (79 kg)   BMI 28.98 kg/m   BP Readings from Last 3 Encounters:  08/29/17 123/64  08/17/17 138/78  08/07/17 (!) 144/80    Wt Readings from Last 3 Encounters:  08/29/17 174 lb 2 oz (79 kg)  08/07/17 174 lb 6 oz (79.1 kg)  05/16/17 173 lb (78.5 kg)     Physical Exam  Constitutional: She is oriented to person, place, and time. She appears well-developed and well-nourished. No distress.  HENT:  Head: Normocephalic and atraumatic.  Right Ear: External ear normal.  Left Ear: External ear normal.  Neck: Normal range of motion. Neck supple.  Cardiovascular: Normal rate, regular rhythm and normal heart sounds.  No murmur heard. Pulmonary/Chest: Effort normal and breath sounds normal. No respiratory distress. She has no wheezes. She has no rales.  Abdominal: Soft. There is no tenderness.  Musculoskeletal: Normal range of motion. She exhibits no edema or tenderness.  Neurological: She is alert and oriented to person, place, and time. She exhibits normal muscle tone. Coordination normal.  Skin: Skin is warm and dry.  Psychiatric: She has a normal mood and affect. Her behavior is normal.      Assessment & Plan:   [name] was seen today for hypertension.  Diagnoses and all orders for this visit:  Intractable chronic migraine without aura and with status migrainosus  Essential hypertension  Pulmonary emphysema, unspecified emphysema type ([place])  Other orders -     fluticasone furoate-vilanterol (BREO ELLIPTA) 200-25 MCG/INH AEPB; Inhale 1 puff into the lungs daily. -     topiramate (TOPAMAX) 25 MG tablet; 1 nightly times 1 week.  Then 2 nightly for 1 week.  Then 3 nightly for 1 week.  Then 4 nightly.   Allergies as of 08/29/2017      Reactions   Clindamycin/lincomycin Rash   Penicillins    Unknown       Medication List        Accurate as of 08/29/17 11:59 PM. Always use your most recent med list.          ALPRAZolam 0.5 MG tablet Commonly known as:  XANAX Take 1 tablet by mouth as needed.   azelastine 0.05 % ophthalmic solution Commonly known as:  OPTIVAR PLACE 1 DROP INTO BOTH EYES EVERY 12 HOURS AS NEEDED FOR UP TO 30 DAYS (ITCHY OR REDDENED EYES)   buPROPion 75 MG tablet Commonly known as:  WELLBUTRIN Take 75 mg by mouth 2 (two) times daily.   cyclobenzaprine 5 MG tablet Commonly known as:  FLEXERIL Take 5 mg by mouth 3 (three) times daily as needed.   dexlansoprazole 60 MG capsule Commonly  known as:  DEXILANT Take 1 capsule (60 mg total) daily by mouth.   escitalopram 20 MG tablet Commonly known as:  LEXAPRO Take 20 mg by mouth daily.   Fluocinonide Emulsified Base 0.05 % Crea Apply to area on abdomen twice daily for 2 weeks   fluticasone furoate-vilanterol 200-25 MCG/INH Aepb Commonly known as:  BREO ELLIPTA Inhale 1 puff into the lungs daily.   gabapentin 600 MG tablet Commonly known as:  NEURONTIN Take 1 tablet by mouth as needed.   HYDROcodone-acetaminophen 10-325 MG tablet Commonly known as:  NORCO Take 1-2 tablets by mouth every 6 (six) hours as needed for moderate pain.   loratadine 10 MG tablet Commonly known as:  CLARITIN Take 10 mg by mouth daily as needed for allergies.   NUVIGIL 250 MG tablet Generic drug:  Armodafinil Take 250 mg by mouth daily.   olmesartan 40 MG tablet Commonly known as:  BENICAR Take 1 tablet (40 mg total) by mouth daily.   OVER THE COUNTER MEDICATION Vitamin for bowels takes once daily Unsure of name   topiramate 25 MG tablet Commonly known as:  TOPAMAX 1 nightly times 1 week.  Then 2 nightly for 1 week.  Then 3 nightly for 1 week.  Then 4 nightly.       Meds ordered this encounter  Medications  . fluticasone furoate-vilanterol (BREO ELLIPTA) 200-25 MCG/INH AEPB    Sig: Inhale 1 puff into the lungs daily.     Dispense:  28 each    Refill:  2  . topiramate (TOPAMAX) 25 MG tablet    Sig: 1 nightly times 1 week.  Then 2 nightly for 1 week.  Then 3 nightly for 1 week.  Then 4 nightly.    Dispense:  120 tablet    Refill:  0    She will titrate the topiramate as noted above for prevention and instead of using a pain pill will try a triptan for targeted therapy against the headache.  Due to this titration plus the start of new inhaler she will need a one-month follow-up.  Follow-up: Return in about 1 month (around 09/28/2017).  Claretta Fraise, M.D.

## 2017-09-02 ENCOUNTER — Encounter: Payer: Self-pay | Admitting: Family Medicine

## 2017-09-03 DIAGNOSIS — M5417 Radiculopathy, lumbosacral region: Secondary | ICD-10-CM | POA: Diagnosis not present

## 2017-09-03 DIAGNOSIS — M5489 Other dorsalgia: Secondary | ICD-10-CM | POA: Diagnosis not present

## 2017-09-03 DIAGNOSIS — M47814 Spondylosis without myelopathy or radiculopathy, thoracic region: Secondary | ICD-10-CM | POA: Diagnosis not present

## 2017-09-03 DIAGNOSIS — M5136 Other intervertebral disc degeneration, lumbar region: Secondary | ICD-10-CM | POA: Diagnosis not present

## 2017-09-25 DIAGNOSIS — G894 Chronic pain syndrome: Secondary | ICD-10-CM | POA: Diagnosis not present

## 2017-09-25 DIAGNOSIS — M5136 Other intervertebral disc degeneration, lumbar region: Secondary | ICD-10-CM | POA: Diagnosis not present

## 2017-09-25 DIAGNOSIS — M961 Postlaminectomy syndrome, not elsewhere classified: Secondary | ICD-10-CM | POA: Diagnosis not present

## 2017-09-25 DIAGNOSIS — M47812 Spondylosis without myelopathy or radiculopathy, cervical region: Secondary | ICD-10-CM | POA: Diagnosis not present

## 2017-09-27 DIAGNOSIS — R69 Illness, unspecified: Secondary | ICD-10-CM | POA: Diagnosis not present

## 2017-09-27 DIAGNOSIS — F411 Generalized anxiety disorder: Secondary | ICD-10-CM | POA: Diagnosis not present

## 2017-09-27 DIAGNOSIS — G8921 Chronic pain due to trauma: Secondary | ICD-10-CM | POA: Diagnosis not present

## 2017-09-28 ENCOUNTER — Other Ambulatory Visit: Payer: Self-pay | Admitting: Family Medicine

## 2017-09-28 DIAGNOSIS — G4731 Primary central sleep apnea: Secondary | ICD-10-CM | POA: Diagnosis not present

## 2017-09-28 DIAGNOSIS — G4733 Obstructive sleep apnea (adult) (pediatric): Secondary | ICD-10-CM | POA: Diagnosis not present

## 2017-10-01 ENCOUNTER — Other Ambulatory Visit: Payer: Self-pay | Admitting: Family Medicine

## 2017-10-01 ENCOUNTER — Encounter: Payer: Self-pay | Admitting: Family Medicine

## 2017-10-01 ENCOUNTER — Ambulatory Visit (INDEPENDENT_AMBULATORY_CARE_PROVIDER_SITE_OTHER): Payer: Medicare HMO | Admitting: Family Medicine

## 2017-10-01 VITALS — BP 129/65 | HR 83 | Temp 98.2°F | Ht 65.0 in | Wt 168.0 lb

## 2017-10-01 DIAGNOSIS — Z124 Encounter for screening for malignant neoplasm of cervix: Secondary | ICD-10-CM

## 2017-10-01 DIAGNOSIS — I10 Essential (primary) hypertension: Secondary | ICD-10-CM | POA: Diagnosis not present

## 2017-10-01 DIAGNOSIS — G43909 Migraine, unspecified, not intractable, without status migrainosus: Secondary | ICD-10-CM

## 2017-10-01 MED ORDER — TOPIRAMATE 50 MG PO TABS: ORAL_TABLET | ORAL | 2 refills | Status: DC

## 2017-10-01 MED ORDER — OLMESARTAN MEDOXOMIL 40 MG PO TABS: 40.0000 mg | ORAL_TABLET | Freq: Every day | ORAL | 5 refills | Status: DC

## 2017-10-01 NOTE — Progress Notes (Signed)
Subjective:  Patient ID: Sarah Chapman, female    DOB: 03-Mar-1961  Age: 57 y.o. MRN: [id number]  CC: Follow-up (pt here today following up on her migraine and topamax)   HPI Sarah Chapman presents for patient the headaches have decreased in frequency and severity.  She did have one episode of 2 seconds of sharp severe headache pain 2 weeks ago that she did not understand.  However overall she has had fewer headaches.  It has been up and down.  At first they went away completely then rebounded then they went away again about the third week and came back for a while but now they seem to have decreased significantly although she still gets headaches.  Follow-up of hypertension. Patient has no history of chest pain or shortness of breath or recent cough.  Patient denies side effects from medication. States taking it regularly.   Depression screen [facility] 2/9 10/01/2017 04/23/2017 04/13/2017  Decreased Interest 0 0 2  Down, Depressed, Hopeless 0 0 1  PHQ - 2 Score 0 0 3  Altered sleeping - - 1  Tired, decreased energy - - 0  Change in appetite - - 1  Feeling bad or failure about yourself  - - 0  Trouble concentrating - - 2  Moving slowly or fidgety/restless - - 0  Suicidal thoughts - - 0  PHQ-9 Score - - 7    History Anab has a past medical history of Allergic rhinitis, Back pain, Chronic headache, COPD (chronic obstructive pulmonary disease) ([place]), Emphysema of lung ([place]), Hyperlipidemia, Hypertension, Neck pain, and Sleep apnea.   She has a past surgical history that includes Neck surgery and Tonsillectomy.   Her family history includes Bone cancer in her mother; Colon cancer (age of onset: 78) in her brother.She reports that she quit smoking about 14 months ago. Her smoking use included cigarettes. She has a 33.00 pack-year smoking history. She has never used smokeless tobacco. She reports that she does not drink alcohol or use drugs.    ROS Review of Systems  Constitutional: Negative.     HENT: Negative.   Eyes: Negative for visual disturbance.  Respiratory: Negative for shortness of breath.   Cardiovascular: Negative for chest pain.  Gastrointestinal: Negative for abdominal pain.  Musculoskeletal: Negative for arthralgias.  Neurological: Positive for headaches.    Objective:  BP 129/65   Pulse 83   Temp 98.2 F (36.8 C) (Oral)   Ht 5\' 5"  (1.651 m)   Wt 168 lb (76.2 kg)   BMI 27.96 kg/m   BP Readings from Last 3 Encounters:  10/01/17 129/65  08/29/17 123/64  08/17/17 138/78    Wt Readings from Last 3 Encounters:  10/01/17 168 lb (76.2 kg)  08/29/17 174 lb 2 oz (79 kg)  08/07/17 174 lb 6 oz (79.1 kg)     Physical Exam  Constitutional: She is oriented to person, place, and time. She appears well-developed and well-nourished. No distress.  Cardiovascular: Normal rate and regular rhythm.  Pulmonary/Chest: Breath sounds normal.  Neurological: She is alert and oriented to person, place, and time.  Skin: Skin is warm and dry.  Psychiatric: She has a normal mood and affect.      Assessment & Plan:   Jory was seen today for follow-up.  Diagnoses and all orders for this visit:  Migraine syndrome -     topiramate (TOPAMAX) 50 MG tablet; Take 3 daily X 1 week, then 4 daily  Essential hypertension -  olmesartan (BENICAR) 40 MG tablet; Take 1 tablet (40 mg total) by mouth daily.  Screening for cervical cancer -     Ambulatory referral to Gynecology       I have discontinued Lattie [facility]'s Fluocinonide Emulsified Base, fluticasone furoate-vilanterol, and topiramate. I am also having her start on topiramate. Additionally, I am having her maintain her HYDROcodone-acetaminophen, NUVIGIL, ALPRAZolam, loratadine, azelastine, escitalopram, cyclobenzaprine, buPROPion, OVER THE COUNTER MEDICATION, dexlansoprazole, gabapentin, and olmesartan.  Allergies as of 10/01/2017      Reactions   Clindamycin/lincomycin Rash   Penicillins    Unknown       Medication List        Accurate as of 10/01/17  5:50 PM. Always use your most recent med list.          ALPRAZolam 0.5 MG tablet Commonly known as:  XANAX Take 1 tablet by mouth as needed.   azelastine 0.05 % ophthalmic solution Commonly known as:  OPTIVAR PLACE 1 DROP INTO BOTH EYES EVERY 12 HOURS AS NEEDED FOR UP TO 30 DAYS (ITCHY OR REDDENED EYES)   buPROPion 75 MG tablet Commonly known as:  WELLBUTRIN Take 75 mg by mouth 2 (two) times daily.   cyclobenzaprine 5 MG tablet Commonly known as:  FLEXERIL Take 5 mg by mouth 3 (three) times daily as needed.   dexlansoprazole 60 MG capsule Commonly known as:  DEXILANT Take 1 capsule (60 mg total) daily by mouth.   escitalopram 20 MG tablet Commonly known as:  LEXAPRO Take 20 mg by mouth daily.   gabapentin 600 MG tablet Commonly known as:  NEURONTIN Take 1 tablet by mouth as needed.   HYDROcodone-acetaminophen 10-325 MG tablet Commonly known as:  NORCO Take 1-2 tablets by mouth every 6 (Chapman) hours as needed for moderate pain.   loratadine 10 MG tablet Commonly known as:  CLARITIN Take 10 mg by mouth daily as needed for allergies.   NUVIGIL 250 MG tablet Generic drug:  Armodafinil Take 250 mg by mouth daily.   olmesartan 40 MG tablet Commonly known as:  BENICAR Take 1 tablet (40 mg total) by mouth daily.   OVER THE COUNTER MEDICATION Vitamin for bowels takes once daily Unsure of name   topiramate 50 MG tablet Commonly known as:  TOPAMAX Take 3 daily X 1 week, then 4 daily        Follow-up: Return in about 3 months (around 01/01/2018) for hypertension and headache.  Claretta Fraise, M.D.

## 2017-10-04 ENCOUNTER — Other Ambulatory Visit: Payer: Self-pay | Admitting: *Deleted

## 2017-10-04 DIAGNOSIS — I10 Essential (primary) hypertension: Secondary | ICD-10-CM

## 2017-10-04 MED ORDER — OLMESARTAN MEDOXOMIL 40 MG PO TABS: 40.0000 mg | ORAL_TABLET | Freq: Every day | ORAL | 1 refills | Status: DC

## 2017-10-20 ENCOUNTER — Other Ambulatory Visit: Payer: Self-pay | Admitting: Nurse Practitioner

## 2017-10-24 DIAGNOSIS — G894 Chronic pain syndrome: Secondary | ICD-10-CM | POA: Diagnosis not present

## 2017-10-24 DIAGNOSIS — M961 Postlaminectomy syndrome, not elsewhere classified: Secondary | ICD-10-CM | POA: Diagnosis not present

## 2017-10-24 DIAGNOSIS — M47814 Spondylosis without myelopathy or radiculopathy, thoracic region: Secondary | ICD-10-CM | POA: Diagnosis not present

## 2017-10-24 DIAGNOSIS — M25569 Pain in unspecified knee: Secondary | ICD-10-CM | POA: Diagnosis not present

## 2017-10-24 DIAGNOSIS — Z79891 Long term (current) use of opiate analgesic: Secondary | ICD-10-CM | POA: Diagnosis not present

## 2017-10-24 DIAGNOSIS — Z79899 Other long term (current) drug therapy: Secondary | ICD-10-CM | POA: Diagnosis not present

## 2017-11-01 DIAGNOSIS — M5489 Other dorsalgia: Secondary | ICD-10-CM | POA: Diagnosis not present

## 2017-11-01 DIAGNOSIS — M47814 Spondylosis without myelopathy or radiculopathy, thoracic region: Secondary | ICD-10-CM | POA: Diagnosis not present

## 2017-11-06 ENCOUNTER — Ambulatory Visit: Payer: Medicare HMO | Admitting: Adult Health

## 2017-11-06 ENCOUNTER — Other Ambulatory Visit: Payer: Self-pay

## 2017-11-06 ENCOUNTER — Encounter: Payer: Self-pay | Admitting: Adult Health

## 2017-11-06 ENCOUNTER — Other Ambulatory Visit (HOSPITAL_COMMUNITY)
Admission: RE | Admit: 2017-11-06 | Discharge: 2017-11-06 | Disposition: A | Discharge status: Home / Self Care | Payer: Medicare HMO | Source: Ambulatory Visit | Attending: Adult Health | Admitting: Adult Health

## 2017-11-06 VITALS — BP 146/79 | HR 93 | Ht 65.0 in | Wt 168.0 lb

## 2017-11-06 DIAGNOSIS — Z1212 Encounter for screening for malignant neoplasm of rectum: Secondary | ICD-10-CM

## 2017-11-06 DIAGNOSIS — Z01411 Encounter for gynecological examination (general) (routine) with abnormal findings: Secondary | ICD-10-CM | POA: Diagnosis not present

## 2017-11-06 DIAGNOSIS — R69 Illness, unspecified: Secondary | ICD-10-CM | POA: Diagnosis not present

## 2017-11-06 DIAGNOSIS — Z01419 Encounter for gynecological examination (general) (routine) without abnormal findings: Secondary | ICD-10-CM | POA: Diagnosis present

## 2017-11-06 DIAGNOSIS — Z1211 Encounter for screening for malignant neoplasm of colon: Secondary | ICD-10-CM | POA: Insufficient documentation

## 2017-11-06 DIAGNOSIS — R6882 Decreased libido: Secondary | ICD-10-CM | POA: Insufficient documentation

## 2017-11-06 LAB — HEMOCCULT GUIAC POC 1CARD (OFFICE): Fecal Occult Blood, POC: NEGATIVE

## 2017-11-06 NOTE — Progress Notes (Signed)
Patient ID: Sarah Chapman, female   DOB: 1960-07-30, 57 y.o.   MRN: [id number] History of Present Illness: Sarah Chapman is a 57 year old white female, PM in for a well woman gyn exam and pap.She is a new pt. PCP is Dr Livia Snellen.    Current Medications, Allergies, Past Medical History, Past Surgical History, Family History and Social History were reviewed in [employer] record.     Review of Systems: Patient denies any headaches, hearing loss, fatigue, blurred vision, shortness of breath, chest pain, abdominal pain, problems with bowel movements,  or intercourse. No joint pain or mood swings.+urianry frequency yesterday +decreased libido and some vaginal dryness    Physical Exam:BP (!) 146/79 (BP Location: Right Arm, Patient Position: Sitting, Cuff Size: Normal)   Pulse 93   Ht 5\' 5"  (1.651 m)   Wt 168 lb (76.2 kg)   BMI 27.96 kg/m  General:  Well developed, well nourished, no acute distress Skin:  Warm and dry Neck:  Midline trachea, normal thyroid, good ROM, no lymphadenopathy Lungs; Clear to auscultation bilaterally Breast:  No dominant palpable mass, retraction, or nipple discharge Cardiovascular: Regular rate and rhythm Abdomen:  Soft, non tender, no hepatosplenomegaly Pelvic:  External genitalia is normal in appearance, no lesions.  The vagina is normal in appearance. Urethra has no lesions or masses. The cervix is smooth, pap with HPV performed.  Uterus is felt to be normal size, shape, and contour.  No adnexal masses or tenderness noted.Bladder is non tender, no masses felt. Rectal: Good sphincter tone, no polyps, or hemorrhoids felt.  Hemoccult negative. Extremities/musculoskeletal:  No swelling or varicosities noted, no clubbing or cyanosis Psych:  No mood changes, alert and cooperative,seems happy PHQ 2 score 0. Discussed increasing foreplay, try luvena and astroglide.  Impression:  1. Encounter for gynecological examination with Papanicolaou smear of cervix   2.  Screening for colorectal cancer   3. Decreased libido      Plan: Physical in 1 year Pap in 3 if normal Mammogram yearly Labs with PCP Colonoscopy per GI

## 2017-11-07 ENCOUNTER — Ambulatory Visit (HOSPITAL_COMMUNITY): Payer: Medicare HMO

## 2017-11-08 ENCOUNTER — Ambulatory Visit (HOSPITAL_COMMUNITY)
Admission: RE | Admit: 2017-11-08 | Discharge: 2017-11-08 | Disposition: A | Discharge status: Home / Self Care | Payer: Medicare HMO | Source: Ambulatory Visit | Attending: Acute Care | Admitting: Acute Care

## 2017-11-08 DIAGNOSIS — I700 Atherosclerosis of aorta: Secondary | ICD-10-CM | POA: Insufficient documentation

## 2017-11-08 DIAGNOSIS — K449 Diaphragmatic hernia without obstruction or gangrene: Secondary | ICD-10-CM | POA: Insufficient documentation

## 2017-11-08 DIAGNOSIS — Z122 Encounter for screening for malignant neoplasm of respiratory organs: Secondary | ICD-10-CM | POA: Diagnosis not present

## 2017-11-08 DIAGNOSIS — K760 Fatty (change of) liver, not elsewhere classified: Secondary | ICD-10-CM | POA: Insufficient documentation

## 2017-11-08 DIAGNOSIS — I2510 Atherosclerotic heart disease of native coronary artery without angina pectoris: Secondary | ICD-10-CM | POA: Diagnosis not present

## 2017-11-08 DIAGNOSIS — J439 Emphysema, unspecified: Secondary | ICD-10-CM | POA: Diagnosis not present

## 2017-11-08 DIAGNOSIS — Z87891 Personal history of nicotine dependence: Secondary | ICD-10-CM | POA: Diagnosis not present

## 2017-11-09 LAB — CYTOLOGY - PAP
Adequacy: ABSENT
DIAGNOSIS: NEGATIVE
HPV (WINDOPATH): NOT DETECTED

## 2017-11-12 DIAGNOSIS — R69 Illness, unspecified: Secondary | ICD-10-CM | POA: Diagnosis not present

## 2017-11-15 ENCOUNTER — Other Ambulatory Visit: Payer: Self-pay | Admitting: Acute Care

## 2017-11-15 DIAGNOSIS — Z122 Encounter for screening for malignant neoplasm of respiratory organs: Secondary | ICD-10-CM

## 2017-11-15 DIAGNOSIS — Z87891 Personal history of nicotine dependence: Secondary | ICD-10-CM

## 2017-11-21 DIAGNOSIS — R69 Illness, unspecified: Secondary | ICD-10-CM | POA: Diagnosis not present

## 2017-11-22 DIAGNOSIS — F411 Generalized anxiety disorder: Secondary | ICD-10-CM | POA: Diagnosis not present

## 2017-11-22 DIAGNOSIS — G8921 Chronic pain due to trauma: Secondary | ICD-10-CM | POA: Diagnosis not present

## 2017-11-22 DIAGNOSIS — R69 Illness, unspecified: Secondary | ICD-10-CM | POA: Diagnosis not present

## 2017-11-23 DIAGNOSIS — R69 Illness, unspecified: Secondary | ICD-10-CM | POA: Diagnosis not present

## 2017-12-03 DIAGNOSIS — Z79899 Other long term (current) drug therapy: Secondary | ICD-10-CM | POA: Diagnosis not present

## 2017-12-03 DIAGNOSIS — M25529 Pain in unspecified elbow: Secondary | ICD-10-CM | POA: Diagnosis not present

## 2017-12-03 DIAGNOSIS — M5136 Other intervertebral disc degeneration, lumbar region: Secondary | ICD-10-CM | POA: Diagnosis not present

## 2017-12-03 DIAGNOSIS — M545 Low back pain: Secondary | ICD-10-CM | POA: Diagnosis not present

## 2017-12-03 DIAGNOSIS — Z79891 Long term (current) use of opiate analgesic: Secondary | ICD-10-CM | POA: Diagnosis not present

## 2017-12-03 DIAGNOSIS — G894 Chronic pain syndrome: Secondary | ICD-10-CM | POA: Diagnosis not present

## 2017-12-06 DIAGNOSIS — M150 Primary generalized (osteo)arthritis: Secondary | ICD-10-CM | POA: Diagnosis not present

## 2017-12-06 DIAGNOSIS — M539 Dorsopathy, unspecified: Secondary | ICD-10-CM | POA: Diagnosis not present

## 2017-12-06 DIAGNOSIS — M2550 Pain in unspecified joint: Secondary | ICD-10-CM | POA: Diagnosis not present

## 2017-12-10 DIAGNOSIS — R69 Illness, unspecified: Secondary | ICD-10-CM | POA: Diagnosis not present

## 2017-12-28 DIAGNOSIS — G4731 Primary central sleep apnea: Secondary | ICD-10-CM | POA: Diagnosis not present

## 2017-12-31 DIAGNOSIS — R69 Illness, unspecified: Secondary | ICD-10-CM | POA: Diagnosis not present

## 2018-01-01 ENCOUNTER — Ambulatory Visit: Payer: Medicare HMO | Admitting: Family Medicine

## 2018-01-02 DIAGNOSIS — M5136 Other intervertebral disc degeneration, lumbar region: Secondary | ICD-10-CM | POA: Diagnosis not present

## 2018-01-02 DIAGNOSIS — M961 Postlaminectomy syndrome, not elsewhere classified: Secondary | ICD-10-CM | POA: Diagnosis not present

## 2018-01-02 DIAGNOSIS — G894 Chronic pain syndrome: Secondary | ICD-10-CM | POA: Diagnosis not present

## 2018-01-17 DIAGNOSIS — G8921 Chronic pain due to trauma: Secondary | ICD-10-CM | POA: Diagnosis not present

## 2018-01-17 DIAGNOSIS — F411 Generalized anxiety disorder: Secondary | ICD-10-CM | POA: Diagnosis not present

## 2018-01-17 DIAGNOSIS — R69 Illness, unspecified: Secondary | ICD-10-CM | POA: Diagnosis not present

## 2018-01-22 DIAGNOSIS — M5136 Other intervertebral disc degeneration, lumbar region: Secondary | ICD-10-CM | POA: Diagnosis not present

## 2018-01-22 DIAGNOSIS — Z79899 Other long term (current) drug therapy: Secondary | ICD-10-CM | POA: Diagnosis not present

## 2018-01-22 DIAGNOSIS — G894 Chronic pain syndrome: Secondary | ICD-10-CM | POA: Diagnosis not present

## 2018-01-22 DIAGNOSIS — M961 Postlaminectomy syndrome, not elsewhere classified: Secondary | ICD-10-CM | POA: Diagnosis not present

## 2018-01-22 DIAGNOSIS — Z79891 Long term (current) use of opiate analgesic: Secondary | ICD-10-CM | POA: Diagnosis not present

## 2018-02-19 DIAGNOSIS — G894 Chronic pain syndrome: Secondary | ICD-10-CM | POA: Diagnosis not present

## 2018-02-19 DIAGNOSIS — M47814 Spondylosis without myelopathy or radiculopathy, thoracic region: Secondary | ICD-10-CM | POA: Diagnosis not present

## 2018-02-19 DIAGNOSIS — M47812 Spondylosis without myelopathy or radiculopathy, cervical region: Secondary | ICD-10-CM | POA: Diagnosis not present

## 2018-02-19 DIAGNOSIS — M5136 Other intervertebral disc degeneration, lumbar region: Secondary | ICD-10-CM | POA: Diagnosis not present

## 2018-02-21 DIAGNOSIS — F411 Generalized anxiety disorder: Secondary | ICD-10-CM | POA: Diagnosis not present

## 2018-02-21 DIAGNOSIS — R69 Illness, unspecified: Secondary | ICD-10-CM | POA: Diagnosis not present

## 2018-02-21 DIAGNOSIS — G8921 Chronic pain due to trauma: Secondary | ICD-10-CM | POA: Diagnosis not present

## 2018-03-12 DIAGNOSIS — M47814 Spondylosis without myelopathy or radiculopathy, thoracic region: Secondary | ICD-10-CM | POA: Diagnosis not present

## 2018-03-19 DIAGNOSIS — Z79899 Other long term (current) drug therapy: Secondary | ICD-10-CM | POA: Diagnosis not present

## 2018-03-19 DIAGNOSIS — M47814 Spondylosis without myelopathy or radiculopathy, thoracic region: Secondary | ICD-10-CM | POA: Diagnosis not present

## 2018-03-19 DIAGNOSIS — M5136 Other intervertebral disc degeneration, lumbar region: Secondary | ICD-10-CM | POA: Diagnosis not present

## 2018-03-19 DIAGNOSIS — G894 Chronic pain syndrome: Secondary | ICD-10-CM | POA: Diagnosis not present

## 2018-03-19 DIAGNOSIS — Z79891 Long term (current) use of opiate analgesic: Secondary | ICD-10-CM | POA: Diagnosis not present

## 2018-03-19 DIAGNOSIS — M47812 Spondylosis without myelopathy or radiculopathy, cervical region: Secondary | ICD-10-CM | POA: Diagnosis not present

## 2018-03-20 ENCOUNTER — Encounter: Payer: Self-pay | Admitting: Advanced Practice Midwife

## 2018-03-20 ENCOUNTER — Ambulatory Visit: Payer: Medicare HMO | Admitting: Advanced Practice Midwife

## 2018-03-20 VITALS — BP 149/87 | HR 93 | Ht 65.0 in | Wt 161.5 lb

## 2018-03-20 DIAGNOSIS — R05 Cough: Secondary | ICD-10-CM

## 2018-03-20 DIAGNOSIS — Z1159 Encounter for screening for other viral diseases: Secondary | ICD-10-CM

## 2018-03-20 DIAGNOSIS — G43909 Migraine, unspecified, not intractable, without status migrainosus: Secondary | ICD-10-CM | POA: Diagnosis not present

## 2018-03-20 DIAGNOSIS — N761 Subacute and chronic vaginitis: Secondary | ICD-10-CM | POA: Diagnosis not present

## 2018-03-20 DIAGNOSIS — N898 Other specified noninflammatory disorders of vagina: Secondary | ICD-10-CM

## 2018-03-20 DIAGNOSIS — Z113 Encounter for screening for infections with a predominantly sexual mode of transmission: Secondary | ICD-10-CM

## 2018-03-20 DIAGNOSIS — R059 Cough, unspecified: Secondary | ICD-10-CM

## 2018-03-20 DIAGNOSIS — R69 Illness, unspecified: Secondary | ICD-10-CM | POA: Diagnosis not present

## 2018-03-20 MED ORDER — TOPIRAMATE 50 MG PO TABS: ORAL_TABLET | ORAL | 0 refills | Status: DC
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
## 2018-03-21 LAB — RPR: RPR Ser Ql: NONREACTIVE

## 2018-03-21 LAB — HIV ANTIBODY (ROUTINE TESTING W REFLEX): HIV Screen 4th Generation wRfx: NONREACTIVE

## 2018-03-21 LAB — GC/CHLAMYDIA PROBE AMP
CHLAMYDIA, DNA PROBE: NEGATIVE
Neisseria gonorrhoeae by PCR: NEGATIVE

## 2018-03-25 ENCOUNTER — Telehealth: Payer: Self-pay | Admitting: Obstetrics & Gynecology

## 2018-03-25 NOTE — Telephone Encounter (Signed)
Patient called, stated she had labs done on 03/21/18, she is calling for results.  [phone]

## 2018-03-25 NOTE — Telephone Encounter (Signed)
Patient informed all labs were negative.  Verbalized gratitude.

## 2018-04-03 ENCOUNTER — Ambulatory Visit: Payer: Medicare HMO | Admitting: Family Medicine

## 2018-04-16 ENCOUNTER — Encounter: Payer: Self-pay | Admitting: Family Medicine

## 2018-04-16 ENCOUNTER — Ambulatory Visit (INDEPENDENT_AMBULATORY_CARE_PROVIDER_SITE_OTHER): Payer: Medicare HMO | Admitting: Family Medicine

## 2018-04-16 VITALS — BP 135/81 | HR 94 | Temp 99.4°F | Ht 65.0 in | Wt 160.0 lb

## 2018-04-16 DIAGNOSIS — G894 Chronic pain syndrome: Secondary | ICD-10-CM | POA: Diagnosis not present

## 2018-04-16 DIAGNOSIS — M25569 Pain in unspecified knee: Secondary | ICD-10-CM | POA: Diagnosis not present

## 2018-04-16 DIAGNOSIS — J181 Lobar pneumonia, unspecified organism: Secondary | ICD-10-CM

## 2018-04-16 DIAGNOSIS — M5136 Other intervertebral disc degeneration, lumbar region: Secondary | ICD-10-CM | POA: Diagnosis not present

## 2018-04-16 DIAGNOSIS — F900 Attention-deficit hyperactivity disorder, predominantly inattentive type: Secondary | ICD-10-CM | POA: Diagnosis not present

## 2018-04-16 DIAGNOSIS — R69 Illness, unspecified: Secondary | ICD-10-CM | POA: Diagnosis not present

## 2018-04-16 DIAGNOSIS — G8921 Chronic pain due to trauma: Secondary | ICD-10-CM | POA: Diagnosis not present

## 2018-04-16 DIAGNOSIS — M961 Postlaminectomy syndrome, not elsewhere classified: Secondary | ICD-10-CM | POA: Diagnosis not present

## 2018-04-16 DIAGNOSIS — F411 Generalized anxiety disorder: Secondary | ICD-10-CM | POA: Diagnosis not present

## 2018-04-16 DIAGNOSIS — J189 Pneumonia, unspecified organism: Secondary | ICD-10-CM

## 2018-04-16 MED ORDER — AZITHROMYCIN 250 MG PO TABS: ORAL_TABLET | ORAL | 0 refills | Status: DC

## 2018-04-16 MED ORDER — BENZONATATE 100 MG PO CAPS: 100.0000 mg | ORAL_CAPSULE | Freq: Three times a day (TID) | ORAL | 0 refills | Status: DC | PRN

## 2018-04-16 NOTE — Patient Instructions (Signed)

## 2018-04-16 NOTE — Progress Notes (Signed)
Subjective: Sarah PCP: Claretta Fraise, MD Sarah Chapman is a 57 y.o. female presenting to clinic today for:  1. Cough Patient reports a greater than 1 week history of productive cough with brown rusty sputum.  She reports subjective fevers and chills at home.  She notes chest pain from coughing.  The cough seems to be worse at nighttime.  Over the last couple of days she started having rhinorrhea.  Prior to that cough was isolated.  She stopped smoking about a year and a half ago.  She had been a smoker for 22 years.  Denies any dyspnea on exertion, wheezing.  She is used over-the-counter cough syrup but it seemed to make the cough worse.  She is treated by pain management with tramadol.  She is also on Xanax as needed.  She also reports recent use of Norco PRN.   ROS: Per HPI  Allergies  Allergen Reactions  . Clindamycin/Lincomycin Rash  . Penicillins     Unknown    Past Medical History:  Diagnosis Date  . Allergic rhinitis   . Back pain   . Chronic headache   . COPD (chronic obstructive pulmonary disease) ([place])   . Emphysema of lung ([place])   . Hyperlipidemia   . Hypertension   . Neck pain   . Sleep apnea   . Vaginal Pap smear, abnormal     Current Outpatient Medications:  .  ADVAIR DISKUS 250-50 MCG/DOSE AEPB, 1 PUFF TWICE DAILY. RINSE MOUTH WITH WATER AFTER EACH USE. (Patient taking differently: as needed. ), Disp: 60 each, Rfl: 11 .  ALPRAZolam (XANAX) 0.5 MG tablet, Take 1 tablet by mouth as needed. , Disp: , Rfl: 2 .  amphetamine-dextroamphetamine (ADDERALL) 10 MG tablet, Take 10 mg by mouth daily. , Disp: , Rfl:  .  azelastine (OPTIVAR) 0.05 % ophthalmic solution, PLACE 1 DROP INTO BOTH EYES EVERY 12 HOURS AS NEEDED FOR UP TO 30 DAYS (ITCHY OR REDDENED EYES), Disp: , Rfl: 5 .  buPROPion (WELLBUTRIN) 75 MG tablet, Take 75 mg by mouth 2 (two) times daily., Disp: , Rfl:  .  celecoxib (CELEBREX) 100 MG capsule, Take 100 mg by mouth 2 (two) times daily. , Disp: , Rfl:    .  cetirizine (ZYRTEC) 10 MG tablet, Take 10 mg by mouth daily. , Disp: , Rfl:  .  cyclobenzaprine (FLEXERIL) 5 MG tablet, Take 5 mg by mouth 3 (three) times daily as needed. , Disp: , Rfl:  .  DEXILANT 60 MG capsule, TAKE (1) CAPSULE DAILY, Disp: 90 capsule, Rfl: 3 .  escitalopram (LEXAPRO) 20 MG tablet, Take 20 mg by mouth daily. , Disp: , Rfl:  .  HYDROcodone-acetaminophen (NORCO) 10-325 MG per tablet, Take 1-2 tablets by mouth every 6 (six) hours as needed for moderate pain. , Disp: , Rfl: 0 .  Ipratropium-Albuterol (COMBIVENT RESPIMAT) 20-100 MCG/ACT AERS respimat, INHALE ONE PUFF INTO THE LUNGS EVERY 6 (SIX) HOURS AS NEEDED FOR WHEEZING., Disp: , Rfl:  .  loratadine (CLARITIN) 10 MG tablet, Take 10 mg by mouth daily as needed for allergies., Disp: , Rfl:  .  nortriptyline (PAMELOR) 10 MG capsule, Take 10 mg by mouth at bedtime. , Disp: , Rfl:  .  NUVIGIL 250 MG tablet, Take 250 mg by mouth daily., Disp: , Rfl: 2 .  olmesartan (BENICAR) 40 MG tablet, Take 1 tablet (40 mg total) by mouth daily., Disp: 90 tablet, Rfl: 1 .  OVER THE COUNTER MEDICATION, Vitamin for bowels takes once  daily Unsure of name, Disp: , Rfl:  .  Sennosides (PERDIEM OVERNIGHT RELIEF PO), Take by mouth at bedtime., Disp: , Rfl:  .  topiramate (TOPAMAX) 50 MG tablet, Take 4 daily, Disp: 120 tablet, Rfl: 0 .  traMADol (ULTRAM) 50 MG tablet, , Disp: , Rfl:  Social History   Socioeconomic History  . Marital status: Divorced    Spouse name: Not on file  . Number of children: Not on file  . Years of education: Not on file  . Highest education level: Not on file  Occupational History  . Not on file  Social Needs  . Financial resource strain: Not on file  . Food insecurity:    Worry: Not on file    Inability: Not on file  . Transportation needs:    Medical: Not on file    Non-medical: Not on file  Tobacco Use  . Smoking status: Former Smoker    Packs/day: 1.00    Years: 33.00    Pack years: 33.00    Types:  Cigarettes    Last attempt to quit: 07/25/2016    Years since quitting: 1.7  . Smokeless tobacco: Never Used  Substance and Sexual Activity  . Alcohol use: No  . Drug use: No  . Sexual activity: Not Currently    Birth control/protection: Post-menopausal  Lifestyle  . Physical activity:    Days per week: Not on file    Minutes per session: Not on file  . Stress: Not on file  Relationships  . Social connections:    Talks on phone: Not on file    Gets together: Not on file    Attends religious service: Not on file    Active member of club or organization: Not on file    Attends meetings of clubs or organizations: Not on file    Relationship status: Not on file  . Intimate partner violence:    Fear of current or ex partner: Not on file    Emotionally abused: Not on file    Physically abused: Not on file    Forced sexual activity: Not on file  Other Topics Concern  . Not on file  Social History Narrative  . Not on file   Family History  Problem Relation Age of Onset  . Bone cancer Mother   . Colon cancer Brother 71  . Heart attack Brother   . Cancer Maternal Grandmother   . Mental illness Son     Objective: Office vital signs reviewed. BP 135/81   Pulse 94   Temp 99.4 F (37.4 C) (Oral)   Ht 5\' 5"  (1.651 m)   Wt 160 lb (72.6 kg)   SpO2 99%   BMI 26.63 kg/m   Physical Examination:  General: Awake, alert, well nourished, nontoxic appearing, No acute distress HEENT: Normal    Neck: No masses palpated. No lymphadenopathy    Ears: Tympanic membranes intact, normal light reflex, no erythema, no bulging    Eyes: PERRLA, extraocular membranes intact, sclera white    Nose: nasal turbinates moist, clear nasal discharge    Throat: moist mucus membranes, no erythema, no tonsillar exudate.  Airway is patent Cardio: regular rate and rhythm, S1S2 heard, no murmurs appreciated Pulm: Decreased breath sounds in the right lower lung fields.  No wheezes, rhonchi or rales; normal  work of breathing on room air  Assessment/ Plan: 57 y.o. female   1. Community acquired pneumonia of right lower lobe of lung [facility]) Patient here with low-grade  fever.  Physical exam was notable for decreased breath sounds in the right lower lung fields.  This concerning for a right lower lobe pneumonia.  I have started her on a Z-Pak.  Prescribed her Tessalon Perles.  Home care instructions reviewed.  Reasons for return discussed.  Reasons for emergent evaluation emergency department also reviewed.  She will follow-up PRN.   No orders of the defined types were placed in this encounter.  Meds ordered this encounter  Medications  . benzonatate (TESSALON PERLES) 100 MG capsule    Sig: Take 1 capsule (100 mg total) by mouth 3 (three) times daily as needed.    Dispense:  20 capsule    Refill:  0  . azithromycin (ZITHROMAX) 250 MG tablet    Sig: Take 2 tablets today, then take 1 tablet daily until gone.    Dispense:  6 tablet    Refill:  0     Sarah Windell Moulding, DO [place] [phone]

## 2018-04-26 ENCOUNTER — Other Ambulatory Visit: Payer: Self-pay | Admitting: *Deleted

## 2018-04-26 DIAGNOSIS — G43909 Migraine, unspecified, not intractable, without status migrainosus: Secondary | ICD-10-CM

## 2018-04-26 MED ORDER — TOPIRAMATE 50 MG PO TABS: ORAL_TABLET | ORAL | 0 refills | Status: DC

## 2018-04-26 NOTE — Telephone Encounter (Signed)
Last seen with [facility] 10/01/17, no future appts scheduled.

## 2018-04-29 ENCOUNTER — Telehealth: Payer: Self-pay | Admitting: Family Medicine

## 2018-04-29 DIAGNOSIS — J189 Pneumonia, unspecified organism: Secondary | ICD-10-CM | POA: Diagnosis not present

## 2018-04-29 NOTE — Telephone Encounter (Signed)
Pt aware and wanted to wait till she gets back next week and be seen with [place] again. Appt scheduled with Dr Livia Snellen 12/10 at 2:55. Pt states if she changes her mind and goes to Urgent Care she will call and let us know.

## 2018-04-29 NOTE — Telephone Encounter (Signed)
She should not need a refill on an antibiotic.  If her symptoms have not improved with Zpak, this is worrisome and I recommend she go to urgent care for a CXR to further evaluate.  She may need escalation of antibiotics.

## 2018-04-29 NOTE — Telephone Encounter (Signed)
I agree with Dr.G. Thanks, WS

## 2018-05-07 ENCOUNTER — Encounter: Payer: Self-pay | Admitting: Family Medicine

## 2018-05-07 ENCOUNTER — Ambulatory Visit (INDEPENDENT_AMBULATORY_CARE_PROVIDER_SITE_OTHER): Payer: Medicare HMO | Admitting: Family Medicine

## 2018-05-07 ENCOUNTER — Ambulatory Visit (INDEPENDENT_AMBULATORY_CARE_PROVIDER_SITE_OTHER): Payer: Medicare HMO

## 2018-05-07 VITALS — BP 122/83 | HR 89 | Temp 98.3°F | Resp 18 | Ht 65.0 in | Wt 163.4 lb

## 2018-05-07 DIAGNOSIS — J441 Chronic obstructive pulmonary disease with (acute) exacerbation: Secondary | ICD-10-CM | POA: Diagnosis not present

## 2018-05-07 DIAGNOSIS — J439 Emphysema, unspecified: Secondary | ICD-10-CM | POA: Diagnosis not present

## 2018-05-07 DIAGNOSIS — R059 Cough, unspecified: Secondary | ICD-10-CM

## 2018-05-07 DIAGNOSIS — R0600 Dyspnea, unspecified: Secondary | ICD-10-CM | POA: Diagnosis not present

## 2018-05-07 DIAGNOSIS — R05 Cough: Secondary | ICD-10-CM | POA: Diagnosis not present

## 2018-05-07 DIAGNOSIS — J189 Pneumonia, unspecified organism: Secondary | ICD-10-CM | POA: Diagnosis not present

## 2018-05-07 MED ORDER — MOXIFLOXACIN HCL 400 MG PO TABS: 400.0000 mg | ORAL_TABLET | Freq: Every day | ORAL | 0 refills | Status: DC

## 2018-05-07 MED ORDER — PREDNISONE 10 MG PO TABS: ORAL_TABLET | ORAL | 0 refills | Status: DC

## 2018-05-07 MED ORDER — FLUTICASONE-SALMETEROL 250-50 MCG/DOSE IN AEPB: INHALATION_SPRAY | RESPIRATORY_TRACT | 11 refills | Status: DC

## 2018-05-07 MED ORDER — IPRATROPIUM-ALBUTEROL 20-100 MCG/ACT IN AERS: 2.0000 | INHALATION_SPRAY | Freq: Four times a day (QID) | RESPIRATORY_TRACT | 11 refills | Status: DC | PRN

## 2018-05-07 NOTE — Progress Notes (Signed)
Chief Complaint  Patient presents with  . Cough  . Shortness of Breath    HPI  Patient presents today for Patient presents with upper respiratory congestion. Rhinorrhea that is frequently purulent. There is moderate sore throat. Patient reports coughing frequently as well.  purulent sputum noted. There is no fever, chills or sweats. The patientreports being significantly short of breath. Onset wasa month ago. Gradually worsening in spite of use of z-pack and subsequent levofloxacin.  PMH: Smoking status noted ROS: Per HPI  Objective: BP 122/83   Pulse 89   Temp 98.3 F (36.8 C) (Oral)   Resp 18   Ht 5\' 5"  (1.651 m)   Wt 163 lb 6.4 oz (74.1 kg)   SpO2 99%   BMI 27.19 kg/m  Gen: NAD, alert, cooperative with exam HEENT: NCAT, Nasal passages swollen, red  CV: RRR, good S1/S2, no murmur Resp: Bronchitis changes with scattered wheezes, non-labored Ext: No edema, warm Neuro: Alert and oriented, No gross deficits Chest x-ray reveals hyperinflation of the lung fields  COPD exacerbation   assessment and plan:  1. Acute exacerbation of chronic obstructive pulmonary disease (COPD) ([place])   2. Cough   3. Dyspnea, unspecified type   4. Pulmonary emphysema, unspecified emphysema type ([place])     Meds ordered this encounter  Medications  . Ipratropium-Albuterol (COMBIVENT RESPIMAT) 20-100 MCG/ACT AERS respimat    Sig: Inhale 2 puffs into the lungs every 6 (six) hours as needed for wheezing or shortness of breath.    Dispense:  1 Inhaler    Refill:  11  . Fluticasone-Salmeterol (ADVAIR DISKUS) 250-50 MCG/DOSE AEPB    Sig: 1 PUFF TWICE DAILY. RINSE MOUTH WITH WATER AFTER EACH USE.    Dispense:  60 each    Refill:  11  . moxifloxacin (AVELOX) 400 MG tablet    Sig: Take 1 tablet (400 mg total) by mouth daily. Take all of these, for infection    Dispense:  10 tablet    Refill:  0  . predniSONE (DELTASONE) 10 MG tablet    Sig: Take 5 daily for 3 days followed by 4,3,2 and 1 for 3 days  each.    Dispense:  45 tablet    Refill:  0    Orders Placed This Encounter  Procedures  . DG Chest 2 View    Standing Status:   Future    Number of Occurrences:   1    Standing Expiration Date:   07/07/2019    Order Specific Question:   Reason for Exam (SYMPTOM  OR DIAGNOSIS REQUIRED)    Answer:   cough, dyspnea    Order Specific Question:   Is the patient pregnant?    Answer:   No    Order Specific Question:   Preferred imaging location?    Answer:   Internal    Follow up as needed.  Claretta Fraise, MD

## 2018-05-14 DIAGNOSIS — G894 Chronic pain syndrome: Secondary | ICD-10-CM | POA: Diagnosis not present

## 2018-05-14 DIAGNOSIS — M5136 Other intervertebral disc degeneration, lumbar region: Secondary | ICD-10-CM | POA: Diagnosis not present

## 2018-05-14 DIAGNOSIS — M961 Postlaminectomy syndrome, not elsewhere classified: Secondary | ICD-10-CM | POA: Diagnosis not present

## 2018-05-14 DIAGNOSIS — Z79891 Long term (current) use of opiate analgesic: Secondary | ICD-10-CM | POA: Diagnosis not present

## 2018-05-14 DIAGNOSIS — Z79899 Other long term (current) drug therapy: Secondary | ICD-10-CM | POA: Diagnosis not present

## 2018-05-16 DIAGNOSIS — M5489 Other dorsalgia: Secondary | ICD-10-CM | POA: Diagnosis not present

## 2018-05-16 DIAGNOSIS — M47814 Spondylosis without myelopathy or radiculopathy, thoracic region: Secondary | ICD-10-CM | POA: Diagnosis not present

## 2018-05-20 ENCOUNTER — Other Ambulatory Visit: Payer: Self-pay | Admitting: Family

## 2018-05-20 DIAGNOSIS — G43909 Migraine, unspecified, not intractable, without status migrainosus: Secondary | ICD-10-CM

## 2018-06-11 DIAGNOSIS — G8921 Chronic pain due to trauma: Secondary | ICD-10-CM | POA: Diagnosis not present

## 2018-06-11 DIAGNOSIS — F411 Generalized anxiety disorder: Secondary | ICD-10-CM | POA: Diagnosis not present

## 2018-06-11 DIAGNOSIS — M961 Postlaminectomy syndrome, not elsewhere classified: Secondary | ICD-10-CM | POA: Diagnosis not present

## 2018-06-11 DIAGNOSIS — G894 Chronic pain syndrome: Secondary | ICD-10-CM | POA: Diagnosis not present

## 2018-06-11 DIAGNOSIS — F900 Attention-deficit hyperactivity disorder, predominantly inattentive type: Secondary | ICD-10-CM | POA: Diagnosis not present

## 2018-06-11 DIAGNOSIS — R69 Illness, unspecified: Secondary | ICD-10-CM | POA: Diagnosis not present

## 2018-06-11 DIAGNOSIS — M47812 Spondylosis without myelopathy or radiculopathy, cervical region: Secondary | ICD-10-CM | POA: Diagnosis not present

## 2018-06-11 DIAGNOSIS — M5136 Other intervertebral disc degeneration, lumbar region: Secondary | ICD-10-CM | POA: Diagnosis not present

## 2018-07-11 DIAGNOSIS — M5489 Other dorsalgia: Secondary | ICD-10-CM | POA: Diagnosis not present

## 2018-07-11 DIAGNOSIS — M47814 Spondylosis without myelopathy or radiculopathy, thoracic region: Secondary | ICD-10-CM | POA: Diagnosis not present

## 2018-07-29 DIAGNOSIS — R69 Illness, unspecified: Secondary | ICD-10-CM | POA: Diagnosis not present

## 2018-07-29 DIAGNOSIS — F900 Attention-deficit hyperactivity disorder, predominantly inattentive type: Secondary | ICD-10-CM | POA: Diagnosis not present

## 2018-07-29 DIAGNOSIS — G8921 Chronic pain due to trauma: Secondary | ICD-10-CM | POA: Diagnosis not present

## 2018-07-29 DIAGNOSIS — F411 Generalized anxiety disorder: Secondary | ICD-10-CM | POA: Diagnosis not present

## 2018-08-01 DIAGNOSIS — R69 Illness, unspecified: Secondary | ICD-10-CM | POA: Diagnosis not present

## 2018-08-05 DIAGNOSIS — M5136 Other intervertebral disc degeneration, lumbar region: Secondary | ICD-10-CM | POA: Diagnosis not present

## 2018-08-05 DIAGNOSIS — M961 Postlaminectomy syndrome, not elsewhere classified: Secondary | ICD-10-CM | POA: Diagnosis not present

## 2018-08-05 DIAGNOSIS — M47812 Spondylosis without myelopathy or radiculopathy, cervical region: Secondary | ICD-10-CM | POA: Diagnosis not present

## 2018-08-05 DIAGNOSIS — Z79899 Other long term (current) drug therapy: Secondary | ICD-10-CM | POA: Diagnosis not present

## 2018-08-05 DIAGNOSIS — Z79891 Long term (current) use of opiate analgesic: Secondary | ICD-10-CM | POA: Diagnosis not present

## 2018-08-05 DIAGNOSIS — G894 Chronic pain syndrome: Secondary | ICD-10-CM | POA: Diagnosis not present

## 2018-08-22 DIAGNOSIS — M47814 Spondylosis without myelopathy or radiculopathy, thoracic region: Secondary | ICD-10-CM | POA: Diagnosis not present

## 2018-09-02 DIAGNOSIS — F411 Generalized anxiety disorder: Secondary | ICD-10-CM | POA: Diagnosis not present

## 2018-09-02 DIAGNOSIS — G8921 Chronic pain due to trauma: Secondary | ICD-10-CM | POA: Diagnosis not present

## 2018-09-02 DIAGNOSIS — M47812 Spondylosis without myelopathy or radiculopathy, cervical region: Secondary | ICD-10-CM | POA: Diagnosis not present

## 2018-09-02 DIAGNOSIS — G894 Chronic pain syndrome: Secondary | ICD-10-CM | POA: Diagnosis not present

## 2018-09-02 DIAGNOSIS — M47814 Spondylosis without myelopathy or radiculopathy, thoracic region: Secondary | ICD-10-CM | POA: Diagnosis not present

## 2018-09-02 DIAGNOSIS — R69 Illness, unspecified: Secondary | ICD-10-CM | POA: Diagnosis not present

## 2018-09-02 DIAGNOSIS — F900 Attention-deficit hyperactivity disorder, predominantly inattentive type: Secondary | ICD-10-CM | POA: Diagnosis not present

## 2018-09-02 DIAGNOSIS — M5136 Other intervertebral disc degeneration, lumbar region: Secondary | ICD-10-CM | POA: Diagnosis not present

## 2018-09-17 ENCOUNTER — Other Ambulatory Visit: Payer: Self-pay | Admitting: Family Medicine

## 2018-09-17 ENCOUNTER — Other Ambulatory Visit: Payer: Self-pay | Admitting: *Deleted

## 2018-09-17 DIAGNOSIS — G43909 Migraine, unspecified, not intractable, without status migrainosus: Secondary | ICD-10-CM

## 2018-09-17 DIAGNOSIS — I10 Essential (primary) hypertension: Secondary | ICD-10-CM

## 2018-09-17 MED ORDER — OLMESARTAN MEDOXOMIL 40 MG PO TABS: 40.0000 mg | ORAL_TABLET | Freq: Every day | ORAL | 0 refills | Status: DC

## 2018-09-20 DIAGNOSIS — R69 Illness, unspecified: Secondary | ICD-10-CM | POA: Diagnosis not present

## 2018-09-30 ENCOUNTER — Ambulatory Visit: Payer: Medicare HMO | Admitting: Family Medicine

## 2018-09-30 ENCOUNTER — Other Ambulatory Visit: Payer: Self-pay

## 2018-09-30 NOTE — Progress Notes (Signed)
No answer/ left message at 12:07 No answer 12:12 No answer 12: 18

## 2018-10-07 DIAGNOSIS — G894 Chronic pain syndrome: Secondary | ICD-10-CM | POA: Diagnosis not present

## 2018-10-07 DIAGNOSIS — M47814 Spondylosis without myelopathy or radiculopathy, thoracic region: Secondary | ICD-10-CM | POA: Diagnosis not present

## 2018-10-07 DIAGNOSIS — M5136 Other intervertebral disc degeneration, lumbar region: Secondary | ICD-10-CM | POA: Diagnosis not present

## 2018-10-07 DIAGNOSIS — M47812 Spondylosis without myelopathy or radiculopathy, cervical region: Secondary | ICD-10-CM | POA: Diagnosis not present

## 2018-10-07 DIAGNOSIS — Z79899 Other long term (current) drug therapy: Secondary | ICD-10-CM | POA: Diagnosis not present

## 2018-10-07 DIAGNOSIS — Z79891 Long term (current) use of opiate analgesic: Secondary | ICD-10-CM | POA: Diagnosis not present

## 2018-10-24 ENCOUNTER — Other Ambulatory Visit: Payer: Self-pay | Admitting: Family Medicine

## 2018-10-24 DIAGNOSIS — G43909 Migraine, unspecified, not intractable, without status migrainosus: Secondary | ICD-10-CM

## 2018-10-29 DIAGNOSIS — F900 Attention-deficit hyperactivity disorder, predominantly inattentive type: Secondary | ICD-10-CM | POA: Diagnosis not present

## 2018-10-29 DIAGNOSIS — R69 Illness, unspecified: Secondary | ICD-10-CM | POA: Diagnosis not present

## 2018-10-29 DIAGNOSIS — G8921 Chronic pain due to trauma: Secondary | ICD-10-CM | POA: Diagnosis not present

## 2018-10-29 DIAGNOSIS — F411 Generalized anxiety disorder: Secondary | ICD-10-CM | POA: Diagnosis not present

## 2018-11-01 DIAGNOSIS — R69 Illness, unspecified: Secondary | ICD-10-CM | POA: Diagnosis not present

## 2018-11-04 DIAGNOSIS — R51 Headache: Secondary | ICD-10-CM | POA: Diagnosis not present

## 2018-11-04 DIAGNOSIS — M961 Postlaminectomy syndrome, not elsewhere classified: Secondary | ICD-10-CM | POA: Diagnosis not present

## 2018-11-04 DIAGNOSIS — G894 Chronic pain syndrome: Secondary | ICD-10-CM | POA: Diagnosis not present

## 2018-11-04 DIAGNOSIS — M47812 Spondylosis without myelopathy or radiculopathy, cervical region: Secondary | ICD-10-CM | POA: Diagnosis not present

## 2018-11-20 IMAGING — DX DG CHEST 2V
2 series · 2 of 2 positions shown · non-contrast
Comparison: 04/03/2014.

CLINICAL DATA: Cough and congestion.

EXAM:
CHEST  2 VIEW

[chest pa]
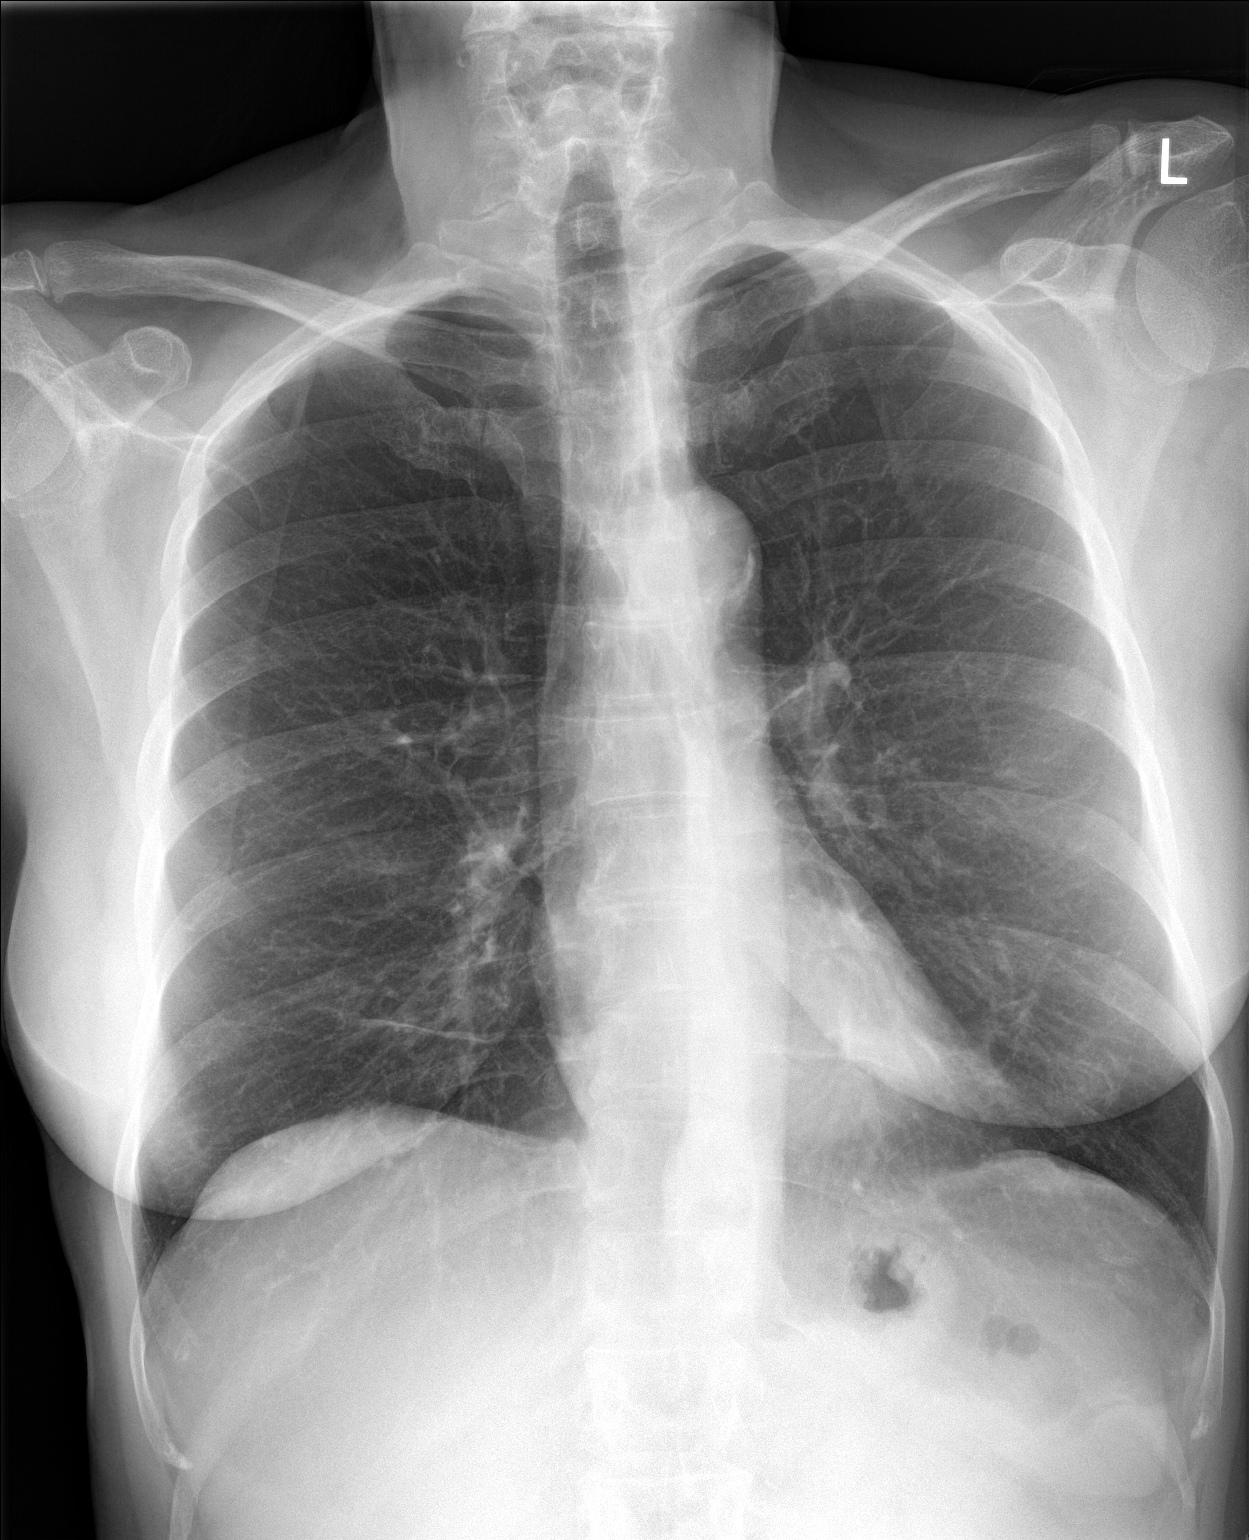

[chest lat]
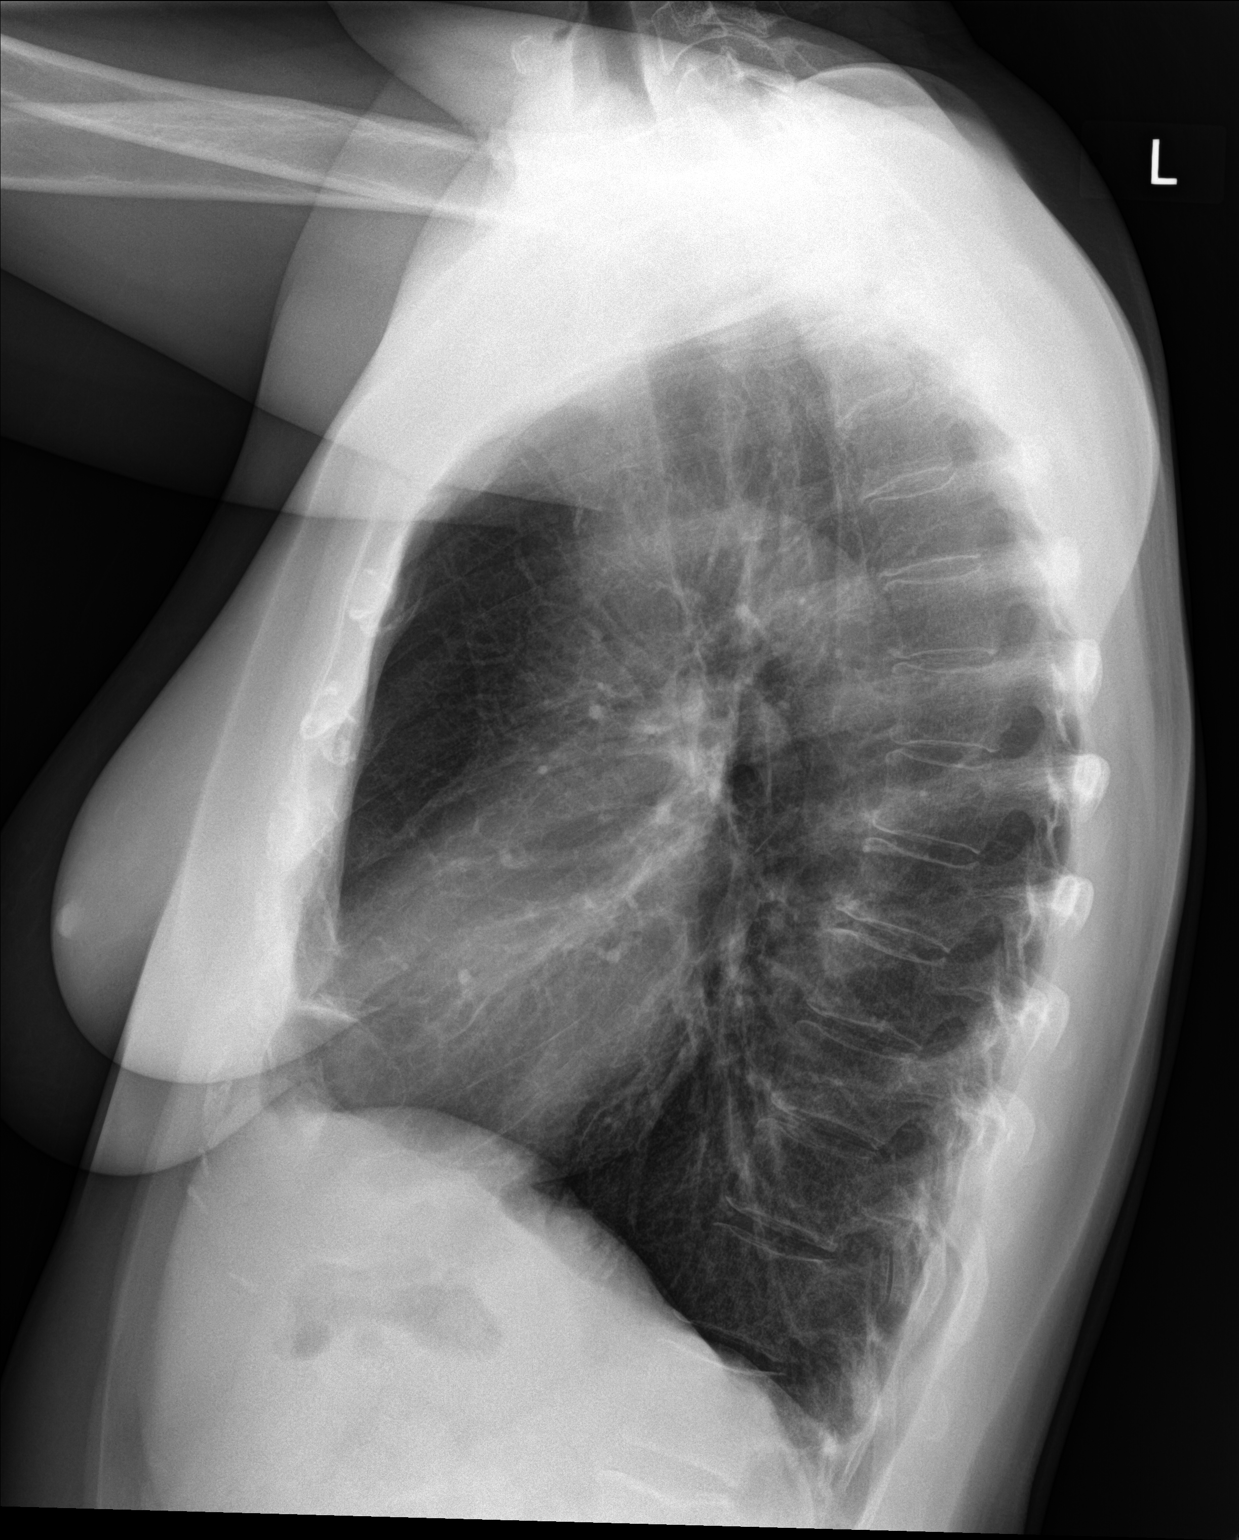

[2 of 2 positions shown; findings below may reference images not displayed]

FINDINGS: Mediastinum hilar structures are normal. Lungs are clear.
Hyperexpansion of both lung fields noted consistent COPD. Mild right
base subsegmental atelectasis. Heart size normal. No acute bony
abnormality.
IMPRESSION: 1. COPD.

2.  Mild right base subsegmental atelectasis.  No acute infiltrate.

## 2018-11-21 DIAGNOSIS — G518 Other disorders of facial nerve: Secondary | ICD-10-CM | POA: Diagnosis not present

## 2018-11-21 DIAGNOSIS — M5481 Occipital neuralgia: Secondary | ICD-10-CM | POA: Diagnosis not present

## 2018-11-21 DIAGNOSIS — M546 Pain in thoracic spine: Secondary | ICD-10-CM | POA: Diagnosis not present

## 2018-11-21 DIAGNOSIS — G43009 Migraine without aura, not intractable, without status migrainosus: Secondary | ICD-10-CM | POA: Diagnosis not present

## 2018-11-21 DIAGNOSIS — G43109 Migraine with aura, not intractable, without status migrainosus: Secondary | ICD-10-CM | POA: Diagnosis not present

## 2018-12-02 DIAGNOSIS — Z79891 Long term (current) use of opiate analgesic: Secondary | ICD-10-CM | POA: Diagnosis not present

## 2018-12-02 DIAGNOSIS — M5136 Other intervertebral disc degeneration, lumbar region: Secondary | ICD-10-CM | POA: Diagnosis not present

## 2018-12-02 DIAGNOSIS — M47814 Spondylosis without myelopathy or radiculopathy, thoracic region: Secondary | ICD-10-CM | POA: Diagnosis not present

## 2018-12-02 DIAGNOSIS — G894 Chronic pain syndrome: Secondary | ICD-10-CM | POA: Diagnosis not present

## 2018-12-02 DIAGNOSIS — Z79899 Other long term (current) drug therapy: Secondary | ICD-10-CM | POA: Diagnosis not present

## 2018-12-02 DIAGNOSIS — R51 Headache: Secondary | ICD-10-CM | POA: Diagnosis not present

## 2018-12-04 DIAGNOSIS — M50323 Other cervical disc degeneration at C6-C7 level: Secondary | ICD-10-CM | POA: Diagnosis not present

## 2018-12-04 DIAGNOSIS — M47812 Spondylosis without myelopathy or radiculopathy, cervical region: Secondary | ICD-10-CM | POA: Diagnosis not present

## 2018-12-04 DIAGNOSIS — G43909 Migraine, unspecified, not intractable, without status migrainosus: Secondary | ICD-10-CM | POA: Diagnosis not present

## 2018-12-04 DIAGNOSIS — I6782 Cerebral ischemia: Secondary | ICD-10-CM | POA: Diagnosis not present

## 2018-12-04 DIAGNOSIS — M5412 Radiculopathy, cervical region: Secondary | ICD-10-CM | POA: Diagnosis not present

## 2018-12-04 DIAGNOSIS — R9082 White matter disease, unspecified: Secondary | ICD-10-CM | POA: Diagnosis not present

## 2018-12-04 DIAGNOSIS — M2578 Osteophyte, vertebrae: Secondary | ICD-10-CM | POA: Diagnosis not present

## 2018-12-10 DIAGNOSIS — F411 Generalized anxiety disorder: Secondary | ICD-10-CM | POA: Diagnosis not present

## 2018-12-10 DIAGNOSIS — R69 Illness, unspecified: Secondary | ICD-10-CM | POA: Diagnosis not present

## 2018-12-10 DIAGNOSIS — G8921 Chronic pain due to trauma: Secondary | ICD-10-CM | POA: Diagnosis not present

## 2018-12-10 DIAGNOSIS — F900 Attention-deficit hyperactivity disorder, predominantly inattentive type: Secondary | ICD-10-CM | POA: Diagnosis not present

## 2018-12-11 ENCOUNTER — Other Ambulatory Visit: Payer: Self-pay | Admitting: Family Medicine

## 2018-12-11 DIAGNOSIS — I10 Essential (primary) hypertension: Secondary | ICD-10-CM

## 2018-12-23 ENCOUNTER — Other Ambulatory Visit: Payer: Self-pay | Admitting: Family Medicine

## 2018-12-23 DIAGNOSIS — R202 Paresthesia of skin: Secondary | ICD-10-CM | POA: Diagnosis not present

## 2018-12-23 DIAGNOSIS — I6782 Cerebral ischemia: Secondary | ICD-10-CM | POA: Diagnosis not present

## 2018-12-23 DIAGNOSIS — G43009 Migraine without aura, not intractable, without status migrainosus: Secondary | ICD-10-CM | POA: Diagnosis not present

## 2018-12-23 DIAGNOSIS — R4182 Altered mental status, unspecified: Secondary | ICD-10-CM | POA: Diagnosis not present

## 2018-12-23 DIAGNOSIS — I10 Essential (primary) hypertension: Secondary | ICD-10-CM

## 2018-12-23 MED ORDER — OLMESARTAN MEDOXOMIL 40 MG PO TABS: 40.0000 mg | ORAL_TABLET | Freq: Every day | ORAL | 0 refills | Status: DC

## 2018-12-23 NOTE — Telephone Encounter (Signed)
Pt aware refill 30 days sent to pharmacy Appt 01/08/19

## 2019-01-01 ENCOUNTER — Other Ambulatory Visit: Payer: Self-pay | Admitting: Gastroenterology

## 2019-01-01 DIAGNOSIS — R69 Illness, unspecified: Secondary | ICD-10-CM | POA: Diagnosis not present

## 2019-01-08 ENCOUNTER — Encounter: Payer: Self-pay | Admitting: Family Medicine

## 2019-01-08 ENCOUNTER — Ambulatory Visit (INDEPENDENT_AMBULATORY_CARE_PROVIDER_SITE_OTHER): Payer: Medicare HMO | Admitting: Family Medicine

## 2019-01-08 DIAGNOSIS — G43909 Migraine, unspecified, not intractable, without status migrainosus: Secondary | ICD-10-CM | POA: Diagnosis not present

## 2019-01-08 DIAGNOSIS — J439 Emphysema, unspecified: Secondary | ICD-10-CM | POA: Diagnosis not present

## 2019-01-08 DIAGNOSIS — I10 Essential (primary) hypertension: Secondary | ICD-10-CM | POA: Diagnosis not present

## 2019-01-08 MED ORDER — DEXILANT 60 MG PO CPDR: DELAYED_RELEASE_CAPSULE | ORAL | 3 refills | Status: DC

## 2019-01-08 MED ORDER — OLMESARTAN MEDOXOMIL 40 MG PO TABS: 40.0000 mg | ORAL_TABLET | Freq: Every day | ORAL | 5 refills | Status: DC

## 2019-01-08 MED ORDER — TOPIRAMATE 100 MG PO TABS: 100.0000 mg | ORAL_TABLET | Freq: Three times a day (TID) | ORAL | 1 refills | Status: DC

## 2019-01-08 MED ORDER — COMBIVENT RESPIMAT 20-100 MCG/ACT IN AERS: 2.0000 | INHALATION_SPRAY | Freq: Four times a day (QID) | RESPIRATORY_TRACT | 11 refills | Status: DC | PRN

## 2019-01-08 MED ORDER — FLUTICASONE-SALMETEROL 250-50 MCG/DOSE IN AEPB: INHALATION_SPRAY | RESPIRATORY_TRACT | 11 refills | Status: DC

## 2019-01-08 NOTE — Progress Notes (Signed)
Subjective:    Patient ID: Sarah Chapman, female    DOB: 28-Mar-1961, 58 y.o.   MRN: [id number]   HPI: Sarah Chapman is a 58 y.o. female presenting for  presents for  follow-up of hypertension. Patient has no history of headache chest pain or shortness of breath or recent cough. Patient also denies symptoms of TIA such as focal numbness or weakness. Patient denies side effects from medication. States taking it regularly. Usually systolicis 673 can't recall diastolic numbers. Neurologist tells her it is good.  Having trouble with breathing but hasn't been using inhalers.    Depression screen [facility] 2/9 05/07/2018 04/16/2018 11/06/2017 10/01/2017 04/23/2017  Decreased Interest 0 0 0 0 0  Down, Depressed, Hopeless 0 0 0 0 0  PHQ - 2 Score 0 0 0 0 0  Altered sleeping - 0 - - -  Tired, decreased energy - 0 - - -  Change in appetite - 0 - - -  Feeling bad or failure about yourself  - 0 - - -  Trouble concentrating - 0 - - -  Moving slowly or fidgety/restless - 0 - - -  Suicidal thoughts - 0 - - -  PHQ-9 Score - 0 - - -  Difficult doing work/chores - Not difficult at all - - -     Relevant past medical, surgical, family and social history reviewed and updated as indicated.  Interim medical history since our last visit reviewed. Allergies and medications reviewed and updated.  ROS:  Review of Systems  Constitutional: Negative.   HENT: Negative for congestion.   Eyes: Negative for visual disturbance.  Respiratory: Negative for shortness of breath.   Cardiovascular: Negative for chest pain.  Gastrointestinal: Negative for abdominal pain, constipation, diarrhea, nausea and vomiting.  Genitourinary: Negative for difficulty urinating.  Musculoskeletal: Negative for arthralgias and myalgias.  Neurological: Negative for headaches.  Psychiatric/Behavioral: Negative for sleep disturbance.     Social History   Tobacco Use  Smoking Status Former Smoker  . Packs/day: 1.00  . Years: 33.00  .  Pack years: 33.00  . Types: Cigarettes  . Quit date: 07/25/2016  . Years since quitting: 2.4  Smokeless Tobacco Never Used       Objective:     Wt Readings from Last 3 Encounters:  05/07/18 163 lb 6.4 oz (74.1 kg)  04/16/18 160 lb (72.6 kg)  03/20/18 161 lb 8 oz (73.3 kg)     Exam deferred. Pt. [place] due to COVID 19. Phone visit performed.   Assessment & Plan:   1. Pulmonary emphysema, unspecified emphysema type ([place])   2. Essential hypertension   3. Migraine syndrome     Meds ordered this encounter  Medications  . olmesartan (BENICAR) 40 MG tablet    Sig: Take 1 tablet (40 mg total) by mouth daily.    Dispense:  30 tablet    Refill:  5  . topiramate (TOPAMAX) 100 MG tablet    Sig: Take 1 tablet (100 mg total) by mouth 3 (three) times daily.    Dispense:  270 tablet    Refill:  1  . dexlansoprazole (DEXILANT) 60 MG capsule    Sig: TAKE (1) CAPSULE DAILY    Dispense:  90 capsule    Refill:  3  . Fluticasone-Salmeterol (ADVAIR DISKUS) 250-50 MCG/DOSE AEPB    Sig: 1 PUFF TWICE DAILY. RINSE MOUTH WITH WATER AFTER EACH USE.    Dispense:  60 each    Refill:  11  .  Ipratropium-Albuterol (COMBIVENT RESPIMAT) 20-100 MCG/ACT AERS respimat    Sig: Inhale 2 puffs into the lungs every 6 (six) hours as needed for wheezing or shortness of breath.    Dispense:  4 g    Refill:  11    No orders of the defined types were placed in this encounter.     Diagnoses and all orders for this visit:  Pulmonary emphysema, unspecified emphysema type ([place])  Essential hypertension -     olmesartan (BENICAR) 40 MG tablet; Take 1 tablet (40 mg total) by mouth daily.  Migraine syndrome -     topiramate (TOPAMAX) 100 MG tablet; Take 1 tablet (100 mg total) by mouth 3 (three) times daily.  Other orders -     dexlansoprazole (DEXILANT) 60 MG capsule; TAKE (1) CAPSULE DAILY -     Fluticasone-Salmeterol (ADVAIR DISKUS) 250-50 MCG/DOSE AEPB; 1 PUFF TWICE DAILY. RINSE MOUTH WITH WATER  AFTER EACH USE. -     Ipratropium-Albuterol (COMBIVENT RESPIMAT) 20-100 MCG/ACT AERS respimat; Inhale 2 puffs into the lungs every 6 (six) hours as needed for wheezing or shortness of breath.    Virtual Visit via telephone Note  I discussed the limitations, risks, security and privacy concerns of performing an evaluation and management service by telephone and the availability of in person appointments. The patient was identified with two identifiers. Pt.expressed understanding and agreed to proceed. Pt. Is at home. Dr. Livia Snellen is in his office.  Follow Up Instructions:   I discussed the assessment and treatment plan with the patient. The patient was provided an opportunity to ask questions and all were answered. The patient agreed with the plan and demonstrated an understanding of the instructions.   The patient was advised to call back or seek an in-person evaluation if the symptoms worsen or if the condition fails to improve as anticipated.   Total minutes including chart review and phone contact time: 25   Follow up plan: Return in about 3 months (around 04/10/2019).  Claretta Fraise, MD [place]

## 2019-01-09 DIAGNOSIS — R4182 Altered mental status, unspecified: Secondary | ICD-10-CM | POA: Diagnosis not present

## 2019-01-10 DIAGNOSIS — M5412 Radiculopathy, cervical region: Secondary | ICD-10-CM | POA: Diagnosis not present

## 2019-01-10 DIAGNOSIS — G43009 Migraine without aura, not intractable, without status migrainosus: Secondary | ICD-10-CM | POA: Diagnosis not present

## 2019-01-10 DIAGNOSIS — M5481 Occipital neuralgia: Secondary | ICD-10-CM | POA: Diagnosis not present

## 2019-01-10 DIAGNOSIS — R4182 Altered mental status, unspecified: Secondary | ICD-10-CM | POA: Diagnosis not present

## 2019-01-10 DIAGNOSIS — G5601 Carpal tunnel syndrome, right upper limb: Secondary | ICD-10-CM | POA: Diagnosis not present

## 2019-01-14 DIAGNOSIS — I6782 Cerebral ischemia: Secondary | ICD-10-CM | POA: Diagnosis not present

## 2019-01-14 DIAGNOSIS — I6521 Occlusion and stenosis of right carotid artery: Secondary | ICD-10-CM | POA: Diagnosis not present

## 2019-01-14 DIAGNOSIS — G43109 Migraine with aura, not intractable, without status migrainosus: Secondary | ICD-10-CM | POA: Diagnosis not present

## 2019-01-17 DIAGNOSIS — M542 Cervicalgia: Secondary | ICD-10-CM | POA: Diagnosis not present

## 2019-01-17 DIAGNOSIS — M5481 Occipital neuralgia: Secondary | ICD-10-CM | POA: Diagnosis not present

## 2019-01-17 DIAGNOSIS — Z79899 Other long term (current) drug therapy: Secondary | ICD-10-CM | POA: Diagnosis not present

## 2019-01-17 DIAGNOSIS — R4182 Altered mental status, unspecified: Secondary | ICD-10-CM | POA: Diagnosis not present

## 2019-01-17 DIAGNOSIS — Z79891 Long term (current) use of opiate analgesic: Secondary | ICD-10-CM | POA: Diagnosis not present

## 2019-01-17 DIAGNOSIS — G894 Chronic pain syndrome: Secondary | ICD-10-CM | POA: Diagnosis not present

## 2019-01-17 DIAGNOSIS — M961 Postlaminectomy syndrome, not elsewhere classified: Secondary | ICD-10-CM | POA: Diagnosis not present

## 2019-01-17 DIAGNOSIS — R51 Headache: Secondary | ICD-10-CM | POA: Diagnosis not present

## 2019-01-17 DIAGNOSIS — M546 Pain in thoracic spine: Secondary | ICD-10-CM | POA: Diagnosis not present

## 2019-01-17 DIAGNOSIS — G43719 Chronic migraine without aura, intractable, without status migrainosus: Secondary | ICD-10-CM | POA: Diagnosis not present

## 2019-01-22 ENCOUNTER — Ambulatory Visit: Payer: Medicare HMO

## 2019-01-30 DIAGNOSIS — I6523 Occlusion and stenosis of bilateral carotid arteries: Secondary | ICD-10-CM | POA: Diagnosis not present

## 2019-02-06 DIAGNOSIS — G894 Chronic pain syndrome: Secondary | ICD-10-CM | POA: Diagnosis not present

## 2019-02-06 DIAGNOSIS — F411 Generalized anxiety disorder: Secondary | ICD-10-CM | POA: Diagnosis not present

## 2019-02-06 DIAGNOSIS — F900 Attention-deficit hyperactivity disorder, predominantly inattentive type: Secondary | ICD-10-CM | POA: Diagnosis not present

## 2019-02-06 DIAGNOSIS — Z79899 Other long term (current) drug therapy: Secondary | ICD-10-CM | POA: Diagnosis not present

## 2019-02-06 DIAGNOSIS — M47814 Spondylosis without myelopathy or radiculopathy, thoracic region: Secondary | ICD-10-CM | POA: Diagnosis not present

## 2019-02-06 DIAGNOSIS — Z79891 Long term (current) use of opiate analgesic: Secondary | ICD-10-CM | POA: Diagnosis not present

## 2019-02-06 DIAGNOSIS — G8921 Chronic pain due to trauma: Secondary | ICD-10-CM | POA: Diagnosis not present

## 2019-02-06 DIAGNOSIS — R69 Illness, unspecified: Secondary | ICD-10-CM | POA: Diagnosis not present

## 2019-02-07 ENCOUNTER — Telehealth: Payer: Self-pay | Admitting: Family Medicine

## 2019-02-07 DIAGNOSIS — G5601 Carpal tunnel syndrome, right upper limb: Secondary | ICD-10-CM | POA: Diagnosis not present

## 2019-02-07 DIAGNOSIS — G43719 Chronic migraine without aura, intractable, without status migrainosus: Secondary | ICD-10-CM | POA: Diagnosis not present

## 2019-02-07 DIAGNOSIS — Z79899 Other long term (current) drug therapy: Secondary | ICD-10-CM | POA: Diagnosis not present

## 2019-02-07 DIAGNOSIS — E531 Pyridoxine deficiency: Secondary | ICD-10-CM | POA: Diagnosis not present

## 2019-02-07 DIAGNOSIS — M546 Pain in thoracic spine: Secondary | ICD-10-CM | POA: Diagnosis not present

## 2019-02-07 DIAGNOSIS — E538 Deficiency of other specified B group vitamins: Secondary | ICD-10-CM | POA: Diagnosis not present

## 2019-02-07 DIAGNOSIS — D529 Folate deficiency anemia, unspecified: Secondary | ICD-10-CM | POA: Diagnosis not present

## 2019-02-07 DIAGNOSIS — M542 Cervicalgia: Secondary | ICD-10-CM | POA: Diagnosis not present

## 2019-02-07 DIAGNOSIS — M5481 Occipital neuralgia: Secondary | ICD-10-CM | POA: Diagnosis not present

## 2019-02-07 NOTE — Telephone Encounter (Signed)
We have not received anything, she will discuss with her neurologist today

## 2019-02-14 DIAGNOSIS — Z79899 Other long term (current) drug therapy: Secondary | ICD-10-CM | POA: Diagnosis not present

## 2019-02-14 DIAGNOSIS — M47812 Spondylosis without myelopathy or radiculopathy, cervical region: Secondary | ICD-10-CM | POA: Diagnosis not present

## 2019-02-14 DIAGNOSIS — R51 Headache: Secondary | ICD-10-CM | POA: Diagnosis not present

## 2019-02-14 DIAGNOSIS — M47814 Spondylosis without myelopathy or radiculopathy, thoracic region: Secondary | ICD-10-CM | POA: Diagnosis not present

## 2019-02-14 DIAGNOSIS — G894 Chronic pain syndrome: Secondary | ICD-10-CM | POA: Diagnosis not present

## 2019-02-14 DIAGNOSIS — Z79891 Long term (current) use of opiate analgesic: Secondary | ICD-10-CM | POA: Diagnosis not present

## 2019-02-19 DIAGNOSIS — G478 Other sleep disorders: Secondary | ICD-10-CM | POA: Diagnosis not present

## 2019-02-19 DIAGNOSIS — G4731 Primary central sleep apnea: Secondary | ICD-10-CM | POA: Diagnosis not present

## 2019-02-19 DIAGNOSIS — R5383 Other fatigue: Secondary | ICD-10-CM | POA: Diagnosis not present

## 2019-02-19 DIAGNOSIS — R69 Illness, unspecified: Secondary | ICD-10-CM | POA: Diagnosis not present

## 2019-02-21 DIAGNOSIS — F411 Generalized anxiety disorder: Secondary | ICD-10-CM | POA: Diagnosis not present

## 2019-02-21 DIAGNOSIS — F900 Attention-deficit hyperactivity disorder, predominantly inattentive type: Secondary | ICD-10-CM | POA: Diagnosis not present

## 2019-02-21 DIAGNOSIS — G8921 Chronic pain due to trauma: Secondary | ICD-10-CM | POA: Diagnosis not present

## 2019-02-21 DIAGNOSIS — R69 Illness, unspecified: Secondary | ICD-10-CM | POA: Diagnosis not present

## 2019-02-24 DIAGNOSIS — G518 Other disorders of facial nerve: Secondary | ICD-10-CM | POA: Diagnosis not present

## 2019-02-24 DIAGNOSIS — M5481 Occipital neuralgia: Secondary | ICD-10-CM | POA: Diagnosis not present

## 2019-02-24 DIAGNOSIS — H02403 Unspecified ptosis of bilateral eyelids: Secondary | ICD-10-CM | POA: Diagnosis not present

## 2019-02-24 DIAGNOSIS — G43719 Chronic migraine without aura, intractable, without status migrainosus: Secondary | ICD-10-CM | POA: Diagnosis not present

## 2019-02-26 DIAGNOSIS — G894 Chronic pain syndrome: Secondary | ICD-10-CM | POA: Diagnosis not present

## 2019-02-26 DIAGNOSIS — M961 Postlaminectomy syndrome, not elsewhere classified: Secondary | ICD-10-CM | POA: Diagnosis not present

## 2019-02-26 DIAGNOSIS — M47814 Spondylosis without myelopathy or radiculopathy, thoracic region: Secondary | ICD-10-CM | POA: Diagnosis not present

## 2019-02-26 DIAGNOSIS — M47812 Spondylosis without myelopathy or radiculopathy, cervical region: Secondary | ICD-10-CM | POA: Diagnosis not present

## 2019-03-04 DIAGNOSIS — F900 Attention-deficit hyperactivity disorder, predominantly inattentive type: Secondary | ICD-10-CM | POA: Diagnosis not present

## 2019-03-04 DIAGNOSIS — F411 Generalized anxiety disorder: Secondary | ICD-10-CM | POA: Diagnosis not present

## 2019-03-04 DIAGNOSIS — H5213 Myopia, bilateral: Secondary | ICD-10-CM | POA: Diagnosis not present

## 2019-03-04 DIAGNOSIS — G8921 Chronic pain due to trauma: Secondary | ICD-10-CM | POA: Diagnosis not present

## 2019-03-04 DIAGNOSIS — R69 Illness, unspecified: Secondary | ICD-10-CM | POA: Diagnosis not present

## 2019-03-06 ENCOUNTER — Other Ambulatory Visit: Payer: Self-pay | Admitting: *Deleted

## 2019-03-06 DIAGNOSIS — I10 Essential (primary) hypertension: Secondary | ICD-10-CM

## 2019-03-06 MED ORDER — OLMESARTAN MEDOXOMIL 40 MG PO TABS: 40.0000 mg | ORAL_TABLET | Freq: Every day | ORAL | 1 refills | Status: DC

## 2019-03-24 DIAGNOSIS — M47812 Spondylosis without myelopathy or radiculopathy, cervical region: Secondary | ICD-10-CM | POA: Diagnosis not present

## 2019-03-24 DIAGNOSIS — M47814 Spondylosis without myelopathy or radiculopathy, thoracic region: Secondary | ICD-10-CM | POA: Diagnosis not present

## 2019-03-24 DIAGNOSIS — M961 Postlaminectomy syndrome, not elsewhere classified: Secondary | ICD-10-CM | POA: Diagnosis not present

## 2019-03-24 DIAGNOSIS — G894 Chronic pain syndrome: Secondary | ICD-10-CM | POA: Diagnosis not present

## 2019-03-26 DIAGNOSIS — R69 Illness, unspecified: Secondary | ICD-10-CM | POA: Diagnosis not present

## 2019-03-26 DIAGNOSIS — F411 Generalized anxiety disorder: Secondary | ICD-10-CM | POA: Diagnosis not present

## 2019-03-26 DIAGNOSIS — G8921 Chronic pain due to trauma: Secondary | ICD-10-CM | POA: Diagnosis not present

## 2019-03-26 DIAGNOSIS — F900 Attention-deficit hyperactivity disorder, predominantly inattentive type: Secondary | ICD-10-CM | POA: Diagnosis not present

## 2019-04-01 DIAGNOSIS — F900 Attention-deficit hyperactivity disorder, predominantly inattentive type: Secondary | ICD-10-CM | POA: Diagnosis not present

## 2019-04-01 DIAGNOSIS — R69 Illness, unspecified: Secondary | ICD-10-CM | POA: Diagnosis not present

## 2019-04-01 DIAGNOSIS — F411 Generalized anxiety disorder: Secondary | ICD-10-CM | POA: Diagnosis not present

## 2019-04-01 DIAGNOSIS — G8921 Chronic pain due to trauma: Secondary | ICD-10-CM | POA: Diagnosis not present

## 2019-04-15 DIAGNOSIS — F411 Generalized anxiety disorder: Secondary | ICD-10-CM | POA: Diagnosis not present

## 2019-04-15 DIAGNOSIS — Z03818 Encounter for observation for suspected exposure to other biological agents ruled out: Secondary | ICD-10-CM | POA: Diagnosis not present

## 2019-04-15 DIAGNOSIS — G8921 Chronic pain due to trauma: Secondary | ICD-10-CM | POA: Diagnosis not present

## 2019-04-15 DIAGNOSIS — R69 Illness, unspecified: Secondary | ICD-10-CM | POA: Diagnosis not present

## 2019-04-15 DIAGNOSIS — F900 Attention-deficit hyperactivity disorder, predominantly inattentive type: Secondary | ICD-10-CM | POA: Diagnosis not present

## 2019-04-16 DIAGNOSIS — F900 Attention-deficit hyperactivity disorder, predominantly inattentive type: Secondary | ICD-10-CM | POA: Diagnosis not present

## 2019-04-16 DIAGNOSIS — F411 Generalized anxiety disorder: Secondary | ICD-10-CM | POA: Diagnosis not present

## 2019-04-16 DIAGNOSIS — G8921 Chronic pain due to trauma: Secondary | ICD-10-CM | POA: Diagnosis not present

## 2019-04-16 DIAGNOSIS — R69 Illness, unspecified: Secondary | ICD-10-CM | POA: Diagnosis not present

## 2019-04-28 DIAGNOSIS — Z79899 Other long term (current) drug therapy: Secondary | ICD-10-CM | POA: Diagnosis not present

## 2019-04-28 DIAGNOSIS — M5136 Other intervertebral disc degeneration, lumbar region: Secondary | ICD-10-CM | POA: Diagnosis not present

## 2019-04-28 DIAGNOSIS — M47812 Spondylosis without myelopathy or radiculopathy, cervical region: Secondary | ICD-10-CM | POA: Diagnosis not present

## 2019-04-28 DIAGNOSIS — G894 Chronic pain syndrome: Secondary | ICD-10-CM | POA: Diagnosis not present

## 2019-04-28 DIAGNOSIS — Z79891 Long term (current) use of opiate analgesic: Secondary | ICD-10-CM | POA: Diagnosis not present

## 2019-04-28 DIAGNOSIS — M47814 Spondylosis without myelopathy or radiculopathy, thoracic region: Secondary | ICD-10-CM | POA: Diagnosis not present

## 2019-04-29 DIAGNOSIS — R69 Illness, unspecified: Secondary | ICD-10-CM | POA: Diagnosis not present

## 2019-04-29 DIAGNOSIS — F900 Attention-deficit hyperactivity disorder, predominantly inattentive type: Secondary | ICD-10-CM | POA: Diagnosis not present

## 2019-04-29 DIAGNOSIS — F411 Generalized anxiety disorder: Secondary | ICD-10-CM | POA: Diagnosis not present

## 2019-04-29 DIAGNOSIS — G8921 Chronic pain due to trauma: Secondary | ICD-10-CM | POA: Diagnosis not present

## 2019-05-12 DIAGNOSIS — F900 Attention-deficit hyperactivity disorder, predominantly inattentive type: Secondary | ICD-10-CM | POA: Diagnosis not present

## 2019-05-12 DIAGNOSIS — R69 Illness, unspecified: Secondary | ICD-10-CM | POA: Diagnosis not present

## 2019-05-12 DIAGNOSIS — F411 Generalized anxiety disorder: Secondary | ICD-10-CM | POA: Diagnosis not present

## 2019-05-12 DIAGNOSIS — G8921 Chronic pain due to trauma: Secondary | ICD-10-CM | POA: Diagnosis not present

## 2019-05-15 DIAGNOSIS — G8921 Chronic pain due to trauma: Secondary | ICD-10-CM | POA: Diagnosis not present

## 2019-05-15 DIAGNOSIS — F411 Generalized anxiety disorder: Secondary | ICD-10-CM | POA: Diagnosis not present

## 2019-05-15 DIAGNOSIS — F900 Attention-deficit hyperactivity disorder, predominantly inattentive type: Secondary | ICD-10-CM | POA: Diagnosis not present

## 2019-05-15 DIAGNOSIS — R69 Illness, unspecified: Secondary | ICD-10-CM | POA: Diagnosis not present

## 2019-05-16 DIAGNOSIS — M5481 Occipital neuralgia: Secondary | ICD-10-CM | POA: Diagnosis not present

## 2019-05-16 DIAGNOSIS — R4182 Altered mental status, unspecified: Secondary | ICD-10-CM | POA: Diagnosis not present

## 2019-05-16 DIAGNOSIS — M546 Pain in thoracic spine: Secondary | ICD-10-CM | POA: Diagnosis not present

## 2019-05-16 DIAGNOSIS — G43719 Chronic migraine without aura, intractable, without status migrainosus: Secondary | ICD-10-CM | POA: Diagnosis not present

## 2019-05-16 DIAGNOSIS — H02403 Unspecified ptosis of bilateral eyelids: Secondary | ICD-10-CM | POA: Diagnosis not present

## 2019-05-19 DIAGNOSIS — G4731 Primary central sleep apnea: Secondary | ICD-10-CM | POA: Diagnosis not present

## 2019-05-19 DIAGNOSIS — R69 Illness, unspecified: Secondary | ICD-10-CM | POA: Diagnosis not present

## 2019-05-26 DIAGNOSIS — M47812 Spondylosis without myelopathy or radiculopathy, cervical region: Secondary | ICD-10-CM | POA: Diagnosis not present

## 2019-05-26 DIAGNOSIS — M47814 Spondylosis without myelopathy or radiculopathy, thoracic region: Secondary | ICD-10-CM | POA: Diagnosis not present

## 2019-05-26 DIAGNOSIS — M5136 Other intervertebral disc degeneration, lumbar region: Secondary | ICD-10-CM | POA: Diagnosis not present

## 2019-05-26 DIAGNOSIS — G894 Chronic pain syndrome: Secondary | ICD-10-CM | POA: Diagnosis not present

## 2019-05-27 DIAGNOSIS — G4733 Obstructive sleep apnea (adult) (pediatric): Secondary | ICD-10-CM | POA: Diagnosis not present

## 2019-05-27 DIAGNOSIS — G4731 Primary central sleep apnea: Secondary | ICD-10-CM | POA: Diagnosis not present

## 2019-06-09 DIAGNOSIS — I6523 Occlusion and stenosis of bilateral carotid arteries: Secondary | ICD-10-CM | POA: Insufficient documentation

## 2019-06-11 DIAGNOSIS — K5900 Constipation, unspecified: Secondary | ICD-10-CM | POA: Diagnosis not present

## 2019-06-11 DIAGNOSIS — M1990 Unspecified osteoarthritis, unspecified site: Secondary | ICD-10-CM | POA: Diagnosis not present

## 2019-06-11 DIAGNOSIS — F411 Generalized anxiety disorder: Secondary | ICD-10-CM | POA: Diagnosis not present

## 2019-06-11 DIAGNOSIS — E785 Hyperlipidemia, unspecified: Secondary | ICD-10-CM | POA: Diagnosis not present

## 2019-06-11 DIAGNOSIS — R69 Illness, unspecified: Secondary | ICD-10-CM | POA: Diagnosis not present

## 2019-06-11 DIAGNOSIS — G629 Polyneuropathy, unspecified: Secondary | ICD-10-CM | POA: Diagnosis not present

## 2019-06-11 DIAGNOSIS — F900 Attention-deficit hyperactivity disorder, predominantly inattentive type: Secondary | ICD-10-CM | POA: Diagnosis not present

## 2019-06-11 DIAGNOSIS — J449 Chronic obstructive pulmonary disease, unspecified: Secondary | ICD-10-CM | POA: Diagnosis not present

## 2019-06-11 DIAGNOSIS — G8929 Other chronic pain: Secondary | ICD-10-CM | POA: Diagnosis not present

## 2019-06-11 DIAGNOSIS — Z008 Encounter for other general examination: Secondary | ICD-10-CM | POA: Diagnosis not present

## 2019-06-11 DIAGNOSIS — I10 Essential (primary) hypertension: Secondary | ICD-10-CM | POA: Diagnosis not present

## 2019-06-11 DIAGNOSIS — K219 Gastro-esophageal reflux disease without esophagitis: Secondary | ICD-10-CM | POA: Diagnosis not present

## 2019-06-11 DIAGNOSIS — G8921 Chronic pain due to trauma: Secondary | ICD-10-CM | POA: Diagnosis not present

## 2019-06-23 DIAGNOSIS — Z79891 Long term (current) use of opiate analgesic: Secondary | ICD-10-CM | POA: Diagnosis not present

## 2019-06-23 DIAGNOSIS — M5136 Other intervertebral disc degeneration, lumbar region: Secondary | ICD-10-CM | POA: Diagnosis not present

## 2019-06-23 DIAGNOSIS — M47814 Spondylosis without myelopathy or radiculopathy, thoracic region: Secondary | ICD-10-CM | POA: Diagnosis not present

## 2019-06-23 DIAGNOSIS — M47812 Spondylosis without myelopathy or radiculopathy, cervical region: Secondary | ICD-10-CM | POA: Diagnosis not present

## 2019-06-23 DIAGNOSIS — G894 Chronic pain syndrome: Secondary | ICD-10-CM | POA: Diagnosis not present

## 2019-06-23 DIAGNOSIS — Z79899 Other long term (current) drug therapy: Secondary | ICD-10-CM | POA: Diagnosis not present

## 2019-06-25 DIAGNOSIS — G8921 Chronic pain due to trauma: Secondary | ICD-10-CM | POA: Diagnosis not present

## 2019-06-25 DIAGNOSIS — R69 Illness, unspecified: Secondary | ICD-10-CM | POA: Diagnosis not present

## 2019-06-25 DIAGNOSIS — F900 Attention-deficit hyperactivity disorder, predominantly inattentive type: Secondary | ICD-10-CM | POA: Diagnosis not present

## 2019-06-25 DIAGNOSIS — F411 Generalized anxiety disorder: Secondary | ICD-10-CM | POA: Diagnosis not present

## 2019-07-04 DIAGNOSIS — G8921 Chronic pain due to trauma: Secondary | ICD-10-CM | POA: Diagnosis not present

## 2019-07-04 DIAGNOSIS — F411 Generalized anxiety disorder: Secondary | ICD-10-CM | POA: Diagnosis not present

## 2019-07-04 DIAGNOSIS — F900 Attention-deficit hyperactivity disorder, predominantly inattentive type: Secondary | ICD-10-CM | POA: Diagnosis not present

## 2019-07-04 DIAGNOSIS — R69 Illness, unspecified: Secondary | ICD-10-CM | POA: Diagnosis not present

## 2019-07-09 DIAGNOSIS — G8921 Chronic pain due to trauma: Secondary | ICD-10-CM | POA: Diagnosis not present

## 2019-07-09 DIAGNOSIS — F900 Attention-deficit hyperactivity disorder, predominantly inattentive type: Secondary | ICD-10-CM | POA: Diagnosis not present

## 2019-07-09 DIAGNOSIS — F411 Generalized anxiety disorder: Secondary | ICD-10-CM | POA: Diagnosis not present

## 2019-07-09 DIAGNOSIS — R69 Illness, unspecified: Secondary | ICD-10-CM | POA: Diagnosis not present

## 2019-07-22 DIAGNOSIS — M5136 Other intervertebral disc degeneration, lumbar region: Secondary | ICD-10-CM | POA: Diagnosis not present

## 2019-07-22 DIAGNOSIS — G894 Chronic pain syndrome: Secondary | ICD-10-CM | POA: Diagnosis not present

## 2019-07-22 DIAGNOSIS — M25569 Pain in unspecified knee: Secondary | ICD-10-CM | POA: Diagnosis not present

## 2019-07-22 DIAGNOSIS — M47814 Spondylosis without myelopathy or radiculopathy, thoracic region: Secondary | ICD-10-CM | POA: Diagnosis not present

## 2019-07-22 DIAGNOSIS — M797 Fibromyalgia: Secondary | ICD-10-CM | POA: Diagnosis not present

## 2019-07-22 DIAGNOSIS — M47812 Spondylosis without myelopathy or radiculopathy, cervical region: Secondary | ICD-10-CM | POA: Diagnosis not present

## 2019-08-06 DIAGNOSIS — F411 Generalized anxiety disorder: Secondary | ICD-10-CM | POA: Diagnosis not present

## 2019-08-06 DIAGNOSIS — G8921 Chronic pain due to trauma: Secondary | ICD-10-CM | POA: Diagnosis not present

## 2019-08-06 DIAGNOSIS — R69 Illness, unspecified: Secondary | ICD-10-CM | POA: Diagnosis not present

## 2019-08-06 DIAGNOSIS — F900 Attention-deficit hyperactivity disorder, predominantly inattentive type: Secondary | ICD-10-CM | POA: Diagnosis not present

## 2019-08-11 ENCOUNTER — Other Ambulatory Visit: Payer: Self-pay

## 2019-08-13 ENCOUNTER — Ambulatory Visit (INDEPENDENT_AMBULATORY_CARE_PROVIDER_SITE_OTHER): Payer: Medicare HMO | Admitting: Family Medicine

## 2019-08-13 ENCOUNTER — Other Ambulatory Visit: Payer: Self-pay

## 2019-08-13 ENCOUNTER — Encounter: Payer: Self-pay | Admitting: Family Medicine

## 2019-08-13 VITALS — BP 135/77 | HR 78 | Temp 97.3°F | Ht 65.0 in | Wt 174.0 lb

## 2019-08-13 DIAGNOSIS — J439 Emphysema, unspecified: Secondary | ICD-10-CM

## 2019-08-13 MED ORDER — FLUTICASONE-SALMETEROL 250-50 MCG/DOSE IN AEPB: INHALATION_SPRAY | RESPIRATORY_TRACT | 11 refills | Status: DC

## 2019-08-13 MED ORDER — ALBUTEROL SULFATE HFA 108 (90 BASE) MCG/ACT IN AERS: 2.0000 | INHALATION_SPRAY | Freq: Four times a day (QID) | RESPIRATORY_TRACT | 5 refills | Status: DC | PRN

## 2019-08-13 NOTE — Progress Notes (Signed)
Subjective:  Patient ID: Sarah Chapman, female    DOB: 1961-04-14  Age: 59 y.o. MRN: [id number]  CC: Referral (pulmonary - breathing)   HPI Sarah Chapman presents for need for referral to pulmonology.  She is having a lot of dyspnea on exertion for about 20 feet.  This is been going on for quite a while she says.  She is seen the [facility] pulmonary before.  It has been several years though.  She has been using her Advair but the Combivent caused her to have throat soreness so she discontinued that.  Using xanax 0.5 hydrocodone 10 QID and armodafinil 250 qd from PDMP.  These are prescribed by pain management and psychiatry.  Depression screen [facility] 2/9 08/13/2019 05/07/2018 04/16/2018  Decreased Interest 3 0 0  Down, Depressed, Hopeless 1 0 0  PHQ - 2 Score 4 0 0  Altered sleeping 0 - 0  Tired, decreased energy 3 - 0  Change in appetite 3 - 0  Feeling bad or failure about yourself  0 - 0  Trouble concentrating 3 - 0  Moving slowly or fidgety/restless 0 - 0  Suicidal thoughts 0 - 0  PHQ-9 Score 13 - 0  Difficult doing work/chores Very difficult - Not difficult at all    History Sarah Chapman has a past medical history of Allergic rhinitis, Back pain, Chronic headache, COPD (chronic obstructive pulmonary disease) ([place]), Emphysema of lung ([place]), Hyperlipidemia, Hypertension, Neck pain, Sleep apnea, and Vaginal Pap smear, abnormal.   She has a past surgical history that includes Neck surgery; Tonsillectomy; Cesarean section; and Gynecologic cryosurgery.   Her family history includes Bone cancer in her mother; Cancer in her maternal grandmother; Colon cancer (age of onset: 24) in her brother; Heart attack in her brother; Mental illness in her son.She reports that she quit smoking about 3 years ago. Her smoking use included cigarettes. She has a 33.00 pack-year smoking history. She has never used smokeless tobacco. She reports that she does not drink alcohol or use drugs.    ROS Review of Systems   Constitutional: Negative.   HENT: Negative.   Eyes: Negative for visual disturbance.  Respiratory: Negative for shortness of breath.   Cardiovascular: Negative for chest pain.  Gastrointestinal: Negative for abdominal pain.  Musculoskeletal: Negative for arthralgias.    Objective:  BP 135/77   Pulse 78   Temp (!) 97.3 F (36.3 C) (Temporal)   Ht 5\' 5"  (1.651 m)   Wt 174 lb (78.9 kg)   SpO2 96%   BMI 28.96 kg/m   BP Readings from Last 3 Encounters:  08/13/19 135/77  05/07/18 122/83  04/16/18 135/81    Wt Readings from Last 3 Encounters:  08/13/19 174 lb (78.9 kg)  05/07/18 163 lb 6.4 oz (74.1 kg)  04/16/18 160 lb (72.6 kg)     Physical Exam Constitutional:      General: She is not in acute distress.    Appearance: She is well-developed.  Cardiovascular:     Rate and Rhythm: Normal rate and regular rhythm.  Pulmonary:     Breath sounds: Normal breath sounds.  Skin:    General: Skin is warm and dry.  Neurological:     Mental Status: She is alert and oriented to person, place, and time.       Assessment & Plan:   Sarah Chapman was seen today for referral.  Diagnoses and all orders for this visit:  Pulmonary emphysema, unspecified emphysema type ([place]) -  Ambulatory referral to Pulmonology  Other orders -     Fluticasone-Salmeterol (ADVAIR DISKUS) 250-50 MCG/DOSE AEPB; 1 PUFF TWICE DAILY. RINSE MOUTH WITH WATER AFTER EACH USE. -     albuterol (VENTOLIN HFA) 108 (90 Base) MCG/ACT inhaler; Inhale 2 puffs into the lungs every 6 (six) hours as needed for wheezing or shortness of breath.       I have discontinued Sarah Chapman's OVER THE COUNTER MEDICATION, traMADol, and topiramate. I am also having her start on albuterol. Additionally, I am having her maintain her Nuvigil, ALPRAZolam, loratadine, celecoxib, nortriptyline, Sennosides (PERDIEM OVERNIGHT RELIEF PO), Dexilant, Combivent Respimat, olmesartan, aspirin, buPROPion, HYDROcodone-acetaminophen, tiZANidine,  pyridostigmine, DULoxetine, gabapentin, procarbazine, simvastatin, and Fluticasone-Salmeterol.  Allergies as of 08/13/2019      Reactions   Clindamycin/lincomycin Rash   Penicillins    Unknown      Medication List       Accurate as of August 13, 2019 10:26 PM. If you have any questions, ask your nurse or doctor.        STOP taking these medications   OVER THE COUNTER MEDICATION Stopped by: Claretta Fraise, MD   topiramate 100 MG tablet Commonly known as: TOPAMAX Stopped by: Claretta Fraise, MD   traMADol 50 MG tablet Commonly known as: ULTRAM Stopped by: Claretta Fraise, MD     TAKE these medications   albuterol 108 (90 Base) MCG/ACT inhaler Commonly known as: VENTOLIN HFA Inhale 2 puffs into the lungs every 6 (six) hours as needed for wheezing or shortness of breath. Started by: Claretta Fraise, MD   ALPRAZolam 0.5 MG tablet Commonly known as: XANAX Take 1 tablet by mouth as needed.   aspirin 81 MG EC tablet Take 1 tablet by mouth daily.   celecoxib 100 MG capsule Commonly known as: CELEBREX Take 100 mg by mouth 2 (two) times daily.   Combivent Respimat 20-100 MCG/ACT Aers respimat Generic drug: Ipratropium-Albuterol Inhale 2 puffs into the lungs every 6 (six) hours as needed for wheezing or shortness of breath.   Dexilant 60 MG capsule Generic drug: dexlansoprazole TAKE (1) CAPSULE DAILY   DULoxetine 30 MG capsule Commonly known as: CYMBALTA Take 30 mg by mouth 2 (two) times daily.   Fluticasone-Salmeterol 250-50 MCG/DOSE Aepb Commonly known as: Advair Diskus 1 PUFF TWICE DAILY. RINSE MOUTH WITH WATER AFTER EACH USE.   gabapentin 300 MG capsule Commonly known as: NEURONTIN Take 300 mg by mouth 2 (two) times daily.   HYDROcodone-acetaminophen 10-325 MG tablet Commonly known as: NORCO Take 1 tablet by mouth 4 (four) times daily as needed.   loratadine 10 MG tablet Commonly known as: CLARITIN Take 10 mg by mouth daily as needed for allergies.    nortriptyline 10 MG capsule Commonly known as: PAMELOR Take 10 mg by mouth at bedtime.   Nuvigil 250 MG tablet Generic drug: Armodafinil Take 250 mg by mouth daily.   olmesartan 40 MG tablet Commonly known as: BENICAR Take 1 tablet (40 mg total) by mouth daily.   PERDIEM OVERNIGHT RELIEF PO Take by mouth at bedtime.   procarbazine 50 MG capsule Commonly known as: MATULANE Take 50 mg by mouth daily. Follow low tyramine diet.   pyridostigmine 60 MG tablet Commonly known as: MESTINON Take 30-60 mg by mouth 2 (two) times daily.   simvastatin 10 MG tablet Commonly known as: ZOCOR Take 10 mg by mouth daily.   tiZANidine 4 MG tablet Commonly known as: ZANAFLEX Take 1 tablet by mouth at bedtime.   Wellbutrin XL 300  MG 24 hr tablet Generic drug: buPROPion Take 1 tablet by mouth daily.        Follow-up: Return in about 6 months (around 02/13/2020).  Claretta Fraise, M.D.

## 2019-08-19 ENCOUNTER — Ambulatory Visit: Payer: Medicare HMO | Admitting: Pulmonary Disease

## 2019-08-19 DIAGNOSIS — R69 Illness, unspecified: Secondary | ICD-10-CM | POA: Diagnosis not present

## 2019-08-19 DIAGNOSIS — M47812 Spondylosis without myelopathy or radiculopathy, cervical region: Secondary | ICD-10-CM | POA: Diagnosis not present

## 2019-08-19 DIAGNOSIS — M5136 Other intervertebral disc degeneration, lumbar region: Secondary | ICD-10-CM | POA: Diagnosis not present

## 2019-08-19 DIAGNOSIS — G894 Chronic pain syndrome: Secondary | ICD-10-CM | POA: Diagnosis not present

## 2019-08-19 DIAGNOSIS — Z79891 Long term (current) use of opiate analgesic: Secondary | ICD-10-CM | POA: Diagnosis not present

## 2019-08-19 DIAGNOSIS — Z79899 Other long term (current) drug therapy: Secondary | ICD-10-CM | POA: Diagnosis not present

## 2019-08-19 DIAGNOSIS — G4731 Primary central sleep apnea: Secondary | ICD-10-CM | POA: Diagnosis not present

## 2019-08-19 DIAGNOSIS — M47814 Spondylosis without myelopathy or radiculopathy, thoracic region: Secondary | ICD-10-CM | POA: Diagnosis not present

## 2019-08-22 DIAGNOSIS — G518 Other disorders of facial nerve: Secondary | ICD-10-CM | POA: Diagnosis not present

## 2019-08-22 DIAGNOSIS — M5412 Radiculopathy, cervical region: Secondary | ICD-10-CM | POA: Diagnosis not present

## 2019-08-22 DIAGNOSIS — I6523 Occlusion and stenosis of bilateral carotid arteries: Secondary | ICD-10-CM | POA: Diagnosis not present

## 2019-08-22 DIAGNOSIS — G43719 Chronic migraine without aura, intractable, without status migrainosus: Secondary | ICD-10-CM | POA: Diagnosis not present

## 2019-08-25 ENCOUNTER — Other Ambulatory Visit: Payer: Self-pay

## 2019-08-25 ENCOUNTER — Ambulatory Visit (INDEPENDENT_AMBULATORY_CARE_PROVIDER_SITE_OTHER): Payer: Medicare HMO | Admitting: Pulmonary Disease

## 2019-08-25 ENCOUNTER — Encounter: Payer: Self-pay | Admitting: Pulmonary Disease

## 2019-08-25 ENCOUNTER — Ambulatory Visit (INDEPENDENT_AMBULATORY_CARE_PROVIDER_SITE_OTHER): Payer: Medicare HMO

## 2019-08-25 VITALS — BP 132/86 | HR 88 | Temp 98.4°F | Ht 65.0 in | Wt 174.0 lb

## 2019-08-25 DIAGNOSIS — J432 Centrilobular emphysema: Secondary | ICD-10-CM

## 2019-08-25 DIAGNOSIS — Z0000 Encounter for general adult medical examination without abnormal findings: Secondary | ICD-10-CM | POA: Diagnosis not present

## 2019-08-25 DIAGNOSIS — Z87891 Personal history of nicotine dependence: Secondary | ICD-10-CM | POA: Diagnosis not present

## 2019-08-25 DIAGNOSIS — G4733 Obstructive sleep apnea (adult) (pediatric): Secondary | ICD-10-CM | POA: Diagnosis not present

## 2019-08-25 DIAGNOSIS — J439 Emphysema, unspecified: Secondary | ICD-10-CM | POA: Diagnosis not present

## 2019-08-25 DIAGNOSIS — Z23 Encounter for immunization: Secondary | ICD-10-CM | POA: Diagnosis not present

## 2019-08-25 DIAGNOSIS — R0789 Other chest pain: Secondary | ICD-10-CM | POA: Diagnosis not present

## 2019-08-25 NOTE — Assessment & Plan Note (Signed)
Plan:  Continue to follow up with [facility] for CPAP management

## 2019-08-25 NOTE — Progress Notes (Signed)
@Patient  ID: Sarah Chapman, female    DOB: April 07, 1961, 59 y.o.   MRN: [id number]  Chief Complaint  Patient presents with  . Follow-up    Increased SOB with exertion. Was prescribed albuterol by PCP as needed, has noticed a difference.     Referring provider: Claretta Fraise, MD  HPI:  59 year old female former smoker followed in our office for COPD  PMH: GERD Smoker/ Smoking History: Former smoker.  Quit 2018.  33-pack-year smoking history Maintenance:  Advair 250 Pt of: Dr. Vaughan Browner  08/25/2019  - Visit   59 year old female former smoker followed in our office for emphysema.  Patient was last seen in our office in 2018 by Dr. Vaughan Browner.  At that office visit recommendations were to follow patient symptoms and consider restarting inhalers if breathing worsens.  Previously she did not feel any clinical benefit when taking them.  Encouraged her to remain a former smoker.  And patient to continue CPAP use.  Patient is also followed in our low-dose lung cancer screening program.  Patient was lost to follow-up with this.  She is unsure why she did not get the follow-up CT.  We will work on this.  Patient reporting that she has recently gained 50 pounds over the last 1/2 years.  Per chart review patient has only gained 15 pounds.  Patient then reports that she is gained 50 pounds over the last 3 to 4 years.  She was last seen in 2018 by Dr. Vaughan Browner when she was not on any maintenance inhalers.  She reports that sometime last year she was started on Advair 250 by primary care.  She also was recently given a rescue inhaler by primary care albuterol.  She was encouraged to follow-up with our office.  She reports she is used her rescue inhaler maybe 3 times over the last week.  Patient is also reporting atypical chest pain for the last year that comes and goes.  Its not always reproducible.  Its sometimes at rest sometimes with physical exertion.  It is a dull pain.  Patient does not feel that it is  directly related to acid reflux.  She is not discussed this with primary care.  Walk today in office patient did not have any oxygen desaturations or symptoms of chest pain.  Questionaires / Pulmonary Flowsheets:   MMRC: mMRC Dyspnea Scale mMRC Score  08/25/2019 1    Tests:   PFTs 04/16/15 Mild to moderate obstruction with reduction in expiratory flow post inhaled bronchodilator. Normal lung volumes and diffusing capacity.  Sleep study 03/02/15 RDI 24.9 Sat 87% Average Sat 91%   Chest x-ray 07/11/18-hyperinflation consistent with COPD. No lung infiltrate.   A1AT 08/22/16- 137, PIMM  10/31/2016-CT chest lung cancer screening-lung RADS 1, negative, mild emphysema  11/09/2017-CT chest lung cancer screening-lung RADS 1, negative, continue annual screening, mild to moderate centrilobular emphysema  FENO:  No results found for: NITRICOXIDE  PFT: No flowsheet data found.  WALK:  SIX MIN WALK 08/25/2019  Supplimental Oxygen during Test? (L/min) No    Imaging: No results found.  Lab Results:  CBC    Component Value Date/Time   WBC 6.6 01/19/2017 1634   RBC 3.88 01/19/2017 1634   HGB 11.9 01/19/2017 1634   HCT 36.4 01/19/2017 1634   PLT 309 01/19/2017 1634   MCV 94 01/19/2017 1634   MCH 30.7 01/19/2017 1634   MCHC 32.7 01/19/2017 1634   RDW 13.0 01/19/2017 1634   LYMPHSABS 2.5 01/19/2017 1634  EOSABS 0.2 01/19/2017 1634   BASOSABS 0.0 01/19/2017 1634    BMET    Component Value Date/Time   NA 141 01/19/2017 1634   K 4.2 01/19/2017 1634   CL 99 01/19/2017 1634   CO2 27 01/19/2017 1634   GLUCOSE 87 01/19/2017 1634   BUN 11 01/19/2017 1634   CREATININE 0.71 01/19/2017 1634   CALCIUM 9.7 01/19/2017 1634   GFRNONAA 95 01/19/2017 1634   GFRAA 109 01/19/2017 1634    BNP No results found for: BNP  ProBNP No results found for: PROBNP  Specialty Problems      Pulmonary Problems   Centrilobular emphysema (HCC)   OSA (obstructive sleep apnea)       Allergies  Allergen Reactions  . Clindamycin/Lincomycin Rash  . Penicillins     Unknown     Immunization History  Administered Date(s) Administered  . Tdap 06/10/2007, 04/03/2014    Past Medical History:  Diagnosis Date  . Allergic rhinitis   . Back pain   . Chronic headache   . COPD (chronic obstructive pulmonary disease) ([place])   . Emphysema of lung ([place])   . Hyperlipidemia   . Hypertension   . Neck pain   . Sleep apnea   . Vaginal Pap smear, abnormal     Tobacco History: Social History   Tobacco Use  Smoking Status Former Smoker  . Packs/day: 1.00  . Years: 33.00  . Pack years: 33.00  . Types: Cigarettes  . Quit date: 07/25/2016  . Years since quitting: 3.0  Smokeless Tobacco Never Used   Counseling given: Yes   Continue to not smoke  Outpatient Encounter Medications as of 08/25/2019  Medication Sig  . albuterol (VENTOLIN HFA) 108 (90 Base) MCG/ACT inhaler Inhale 2 puffs into the lungs every 6 (six) hours as needed for wheezing or shortness of breath.  . ALPRAZolam (XANAX) 0.5 MG tablet Take 1 tablet by mouth as needed.   Marland Kitchen aspirin 81 MG EC tablet Take 1 tablet by mouth daily.  Marland Kitchen buPROPion (WELLBUTRIN XL) 300 MG 24 hr tablet Take 1 tablet by mouth daily.  . celecoxib (CELEBREX) 100 MG capsule Take 100 mg by mouth 2 (two) times daily.   Marland Kitchen dexlansoprazole (DEXILANT) 60 MG capsule TAKE (1) CAPSULE DAILY  . DULoxetine (CYMBALTA) 30 MG capsule Take 30 mg by mouth 2 (two) times daily.  . Fluticasone-Salmeterol (ADVAIR DISKUS) 250-50 MCG/DOSE AEPB 1 PUFF TWICE DAILY. RINSE MOUTH WITH WATER AFTER EACH USE.  . gabapentin (NEURONTIN) 300 MG capsule Take 300 mg by mouth 2 (two) times daily.  Marland Kitchen HYDROcodone-acetaminophen (NORCO) 10-325 MG tablet Take 1 tablet by mouth 4 (four) times daily as needed.  . loratadine (CLARITIN) 10 MG tablet Take 10 mg by mouth daily as needed for allergies.  Marland Kitchen nortriptyline (PAMELOR) 10 MG capsule Take 10 mg by mouth at bedtime.   Marland Kitchen  NUVIGIL 250 MG tablet Take 250 mg by mouth daily.  Marland Kitchen olmesartan (BENICAR) 40 MG tablet Take 1 tablet (40 mg total) by mouth daily.  . procarbazine (MATULANE) 50 MG capsule Take 50 mg by mouth daily. Follow low tyramine diet.  Marland Kitchen pyridostigmine (MESTINON) 60 MG tablet Take 30-60 mg by mouth 2 (two) times daily.  . Sennosides (PERDIEM OVERNIGHT RELIEF PO) Take by mouth at bedtime.  . simvastatin (ZOCOR) 10 MG tablet Take 10 mg by mouth daily.  Marland Kitchen tiZANidine (ZANAFLEX) 4 MG tablet Take 1 tablet by mouth at bedtime.  . [DISCONTINUED] Ipratropium-Albuterol (COMBIVENT RESPIMAT) 20-100 MCG/ACT AERS  respimat Inhale 2 puffs into the lungs every 6 (six) hours as needed for wheezing or shortness of breath.   No facility-administered encounter medications on file as of 08/25/2019.     Review of Systems  Review of Systems  Constitutional: Negative for activity change, fatigue and fever.  HENT: Positive for congestion. Negative for sinus pressure, sinus pain and sore throat.   Respiratory: Positive for shortness of breath. Negative for cough and wheezing.   Cardiovascular: Positive for chest pain. Negative for palpitations.  Gastrointestinal: Negative for diarrhea, nausea and vomiting.  Musculoskeletal: Positive for back pain. Negative for arthralgias.  Neurological: Negative for dizziness.  Psychiatric/Behavioral: Negative for sleep disturbance. The patient is not nervous/anxious.      Physical Exam  BP 132/86 (BP Location: Left Arm, Patient Position: Sitting, Cuff Size: Normal)   Pulse 88   Temp 98.4 F (36.9 C) (Oral)   Ht 5\' 5"  (1.651 m)   Wt 174 lb (78.9 kg)   SpO2 97% Comment: on RA  BMI 28.96 kg/m   Wt Readings from Last 5 Encounters:  08/25/19 174 lb (78.9 kg)  08/13/19 174 lb (78.9 kg)  05/07/18 163 lb 6.4 oz (74.1 kg)  04/16/18 160 lb (72.6 kg)  03/20/18 161 lb 8 oz (73.3 kg)    BMI Readings from Last 5 Encounters:  08/25/19 28.96 kg/m  08/13/19 28.96 kg/m  05/07/18 27.19  kg/m  04/16/18 26.63 kg/m  03/20/18 26.88 kg/m     Physical Exam Vitals and nursing note reviewed.  Constitutional:      General: She is not in acute distress.    Appearance: Normal appearance. She is obese.  HENT:     Head: Normocephalic and atraumatic.     Right Ear: Tympanic membrane, ear canal and external ear normal. There is no impacted cerumen.     Left Ear: Tympanic membrane, ear canal and external ear normal. There is no impacted cerumen.     Nose: Nose normal. No congestion.     Mouth/Throat:     Mouth: Mucous membranes are moist.     Pharynx: Oropharynx is clear.  Eyes:     Pupils: Pupils are equal, round, and reactive to light.  Cardiovascular:     Rate and Rhythm: Normal rate and regular rhythm.     Pulses: Normal pulses.     Heart sounds: Normal heart sounds. No murmur.  Pulmonary:     Effort: Pulmonary effort is normal. No respiratory distress.     Breath sounds: Normal breath sounds. No decreased air movement. No decreased breath sounds, wheezing or rales.  Abdominal:     General: Abdomen is flat. Bowel sounds are normal.     Palpations: Abdomen is soft.  Musculoskeletal:     Cervical back: Normal range of motion.  Skin:    General: Skin is warm and dry.     Capillary Refill: Capillary refill takes less than 2 seconds.  Neurological:     General: No focal deficit present.     Mental Status: She is alert and oriented to person, place, and time. Mental status is at baseline.     Gait: Gait (tolerated walk in office ) normal.  Psychiatric:        Mood and Affect: Mood normal. Affect is flat.        Behavior: Behavior normal.        Thought Content: Thought content normal.        Judgment: Judgment normal.     Comments: Occasionally  confused with medical plan of care when questioned        Assessment & Plan:   Centrilobular emphysema ([place]) Plan: Continue Advair 250 Chest x-ray today Rescue inhaler as needed We will order pulmonary function  testing We will refer back to lung cancer screening program Keep follow-up with our office  Atypical chest pain Unsure what to make of patient's atypical chest pain This is been going on for over a year Patient was walked in office today without any symptoms  Plan: Encourage patient to seek further evaluation if she starts to have this atypical chest pain again Keep follow-up with primary care Can discuss with primary care if she needs to be referred to cardiology based off of her atypical chest pain  Former smoker Last lung cancer screening CT was in 2019  Plan: Referral to lung cancer screening program to get patient reestablished and scheduled for a low-dose CT  Healthcare maintenance Pt currently scheduled to receive covid vaccine today   Plan:  Receive covid vaccine  We recommend Pneumovax23 when you are 2 weeks out of covid vaccine  Discuss Shingrix vaccine with PCP   OSA (obstructive sleep apnea) Plan:  Continue to follow up with [facility] for CPAP management     Return in about 2 months (around 10/25/2019), or if symptoms worsen or fail to improve, for Follow up with Dr. Vaughan Browner, Follow up for PFT.   Lauraine Rinne, NP 08/25/2019   This appointment required 32 minutes of patient care (this includes precharting, chart review, review of results, face-to-face care, etc.).

## 2019-08-25 NOTE — Assessment & Plan Note (Signed)
Last lung cancer screening CT was in 2019  Plan: Referral to lung cancer screening program to get patient reestablished and scheduled for a low-dose CT

## 2019-08-25 NOTE — Assessment & Plan Note (Signed)
Pt currently scheduled to receive covid vaccine today   Plan:  Receive covid vaccine  We recommend Pneumovax23 when you are 2 weeks out of covid vaccine  Discuss Shingrix vaccine with PCP

## 2019-08-25 NOTE — Patient Instructions (Addendum)
You were seen today by Lauraine Rinne, NP  for:   Nice seeing you in office today.  Please continue to follow-up with [place] with any of your breathing concerns.  We will get a chest x-ray today walking in office as well as order pulmonary function testing to further evaluate your breathing.  Take care and stay safe,   Sarah Chapman   1. Centrilobular emphysema ([place])  - Pulmonary function test; Future - Ambulatory Referral for Lung Cancer Scre - DG Chest 2 View; Future  Walk today in office  Continue Advair 250  We may consider switching this inhaler after next pulmonary function testing  Only use your albuterol as a rescue medication to be used if you can't catch your breath by resting or doing a relaxed purse lip breathing pattern.  - The less you use it, the better it will work when you need it. - Ok to use up to 2 puffs  every 4 hours if you must but call for immediate appointment if use goes up over your usual need - Don't leave home without it !!  (think of it like the spare tire for your car)    2. Former smoker  - Ambulatory Referral for Lung Cancer Scre  We will get you reestablished with the lung cancer screening program  We will refer you today to our lung cancer screening program >>>This is based off of your 33 pack-year smoking history >>> This is a recommendation from the [place] preventative services task force (USPSTF) >>>The USPSTF recommends annual screening for lung cancer with low-dose computed tomography (LDCT) in adults aged 80 to 80 years who have a 20 pack-year smoking history and currently smoke or have quit within the past 15 years. Screening should be discontinued once a person has not smoked for 15 years or develops a health problem that substantially limits life expectancy or the ability or willingness to have curative lung surgery.     3. Healthcare maintenance  Okay to proceed forward with COVID-19 vaccination as planned today  After you received your Covid 19  vaccinations and you are 2 weeks after your second Covid shot I believe would be reasonable to consider getting the Pneumovax 23  4. Atypical chest pain  Unsure why you may be having this atypical chest pain has been going on for greater than a year and comes and goes  Walk today in office  Chest x-ray today  I would recommend that you follow-up with primary care regarding the symptoms   We recommend today:  Orders Placed This Encounter  Procedures  . DG Chest 2 View    Standing Status:   Future    Standing Expiration Date:   10/24/2020    Order Specific Question:   Reason for Exam (SYMPTOM  OR DIAGNOSIS REQUIRED)    Answer:   former smoker    Order Specific Question:   Preferred imaging location?    Answer:   Internal    Order Specific Question:   Radiology Contrast Protocol - do NOT remove file path    Answer:   [email or web address]  . Ambulatory Referral for Lung Cancer Scre    Referral Priority:   Routine    Referral Type:   Consultation    Referral Reason:   Specialty Services Required    Number of Visits Requested:   1  . Pulmonary function test    Standing Status:   Future    Standing Expiration Date:   08/24/2020  Order Specific Question:   Where should this test be performed?    Answer:   [facility]    Order Specific Question:   Full PFT: includes the following: basic spirometry, spirometry pre & post bronchodilator, diffusion capacity (DLCO), lung volumes    Answer:   Full PFT   Orders Placed This Encounter  Procedures  . DG Chest 2 View  . Ambulatory Referral for Lung Cancer Scre  . Pulmonary function test   No orders of the defined types were placed in this encounter.   Follow Up:    Return in about 2 months (around 10/25/2019), or if symptoms worsen or fail to improve, for Follow up with Dr. Vaughan Browner, Follow up for PFT.   Please do your part to reduce the spread of COVID-19:      Reduce your risk of any  infection  and COVID19 by using the similar precautions used for avoiding the common cold or flu:  Marland Kitchen Wash your hands often with soap and warm water for at least 20 seconds.  If soap and water are not readily available, use an alcohol-based hand sanitizer with at least 60% alcohol.  . If coughing or sneezing, cover your mouth and nose by coughing or sneezing into the elbow areas of your shirt or coat, into a tissue or into your sleeve (not your hands). Langley Gauss A MASK when in public  . Avoid shaking hands with others and consider head nods or verbal greetings only. . Avoid touching your eyes, nose, or mouth with unwashed hands.  . Avoid close contact with people who are sick. . Avoid places or events with large numbers of people in one location, like concerts or sporting events. . If you have some symptoms but not all symptoms, continue to monitor at home and seek medical attention if your symptoms worsen. . If you are having a medical emergency, call 911.   [place] / e-Visit: [email or web address]         [facility]: [facility]: [id number]                   [facility]: [id number]     It is flu season:   >>> Best ways to protect herself from the flu: Receive the yearly flu vaccine, practice good hand hygiene washing with soap and also using hand sanitizer when available, eat a nutritious meals, get adequate rest, hydrate appropriately   Please contact the office if your symptoms worsen or you have concerns that you are not improving.   Thank you for choosing [facility] for your healthcare, and for allowing [place] to partner with you on your healthcare journey. I am thankful to be able to provide care to you today.   Wyn Quaker FNP-C

## 2019-08-25 NOTE — Assessment & Plan Note (Signed)
Unsure what to make of patient's atypical chest pain This is been going on for over a year Patient was walked in office today without any symptoms  Plan: Encourage patient to seek further evaluation if she starts to have this atypical chest pain again Keep follow-up with primary care Can discuss with primary care if she needs to be referred to cardiology based off of her atypical chest pain

## 2019-08-25 NOTE — Assessment & Plan Note (Signed)
Plan: Continue Advair 250 Chest x-ray today Rescue inhaler as needed We will order pulmonary function testing We will refer back to lung cancer screening program Keep follow-up with our office

## 2019-08-26 ENCOUNTER — Telehealth: Payer: Self-pay | Admitting: Pulmonary Disease

## 2019-08-26 ENCOUNTER — Other Ambulatory Visit: Payer: Self-pay | Admitting: *Deleted

## 2019-08-26 DIAGNOSIS — Z87891 Personal history of nicotine dependence: Secondary | ICD-10-CM

## 2019-08-26 MED ORDER — ADVAIR DISKUS 250-50 MCG/DOSE IN AEPB: INHALATION_SPRAY | RESPIRATORY_TRACT | 11 refills | Status: DC

## 2019-08-26 NOTE — Telephone Encounter (Signed)
Chest x-ray clear. Patient needs to be scheduled for lung cancer screening CT. Looping Doroteo Glassman into this conversation so she can coordinate.  Wyn Quaker, FNP  -----------------------------------------------------  Called and spoke to pt. Informed her of the results and recs per Wyn Quaker, NP. Pt verbalized understanding and denied any further questions or concerns at this time.    Will forward to [facility] and lung nodule pool for pt's annual lung cancer screening CT.

## 2019-08-27 DIAGNOSIS — G8921 Chronic pain due to trauma: Secondary | ICD-10-CM | POA: Diagnosis not present

## 2019-08-27 DIAGNOSIS — F900 Attention-deficit hyperactivity disorder, predominantly inattentive type: Secondary | ICD-10-CM | POA: Diagnosis not present

## 2019-08-27 DIAGNOSIS — F411 Generalized anxiety disorder: Secondary | ICD-10-CM | POA: Diagnosis not present

## 2019-08-27 DIAGNOSIS — R69 Illness, unspecified: Secondary | ICD-10-CM | POA: Diagnosis not present

## 2019-08-27 NOTE — Telephone Encounter (Signed)
New order placed for f/u lung cancer screening CT. Sarah Chapman will contact pt to schedule. Nothing further needed.

## 2019-08-28 ENCOUNTER — Telehealth: Payer: Self-pay | Admitting: Family Medicine

## 2019-08-28 NOTE — Telephone Encounter (Signed)
  REFERRAL REQUEST Telephone Note 08/28/2019  What type of referral do you need? Cardiology  Have you been seen at our office for this problem? Yes - Patient was referred to Pulmonary and was seen and lungs were clear and they stated she needed to be referred to Cardiology. (Advise that they may need an appointment with their PCP before a referral can be done)  Is there a particular doctor or location that you prefer? Internal through [facility].  Patient notified that referrals can take up to a week or longer to process. If they haven't heard anything within a week they should call back and speak with the referral department.

## 2019-09-01 ENCOUNTER — Other Ambulatory Visit (HOSPITAL_COMMUNITY)
Admission: RE | Admit: 2019-09-01 | Discharge: 2019-09-01 | Disposition: A | Discharge status: Home / Self Care | Payer: Medicare HMO | Source: Ambulatory Visit | Attending: Pulmonary Disease | Admitting: Pulmonary Disease

## 2019-09-01 DIAGNOSIS — Z01812 Encounter for preprocedural laboratory examination: Secondary | ICD-10-CM | POA: Insufficient documentation

## 2019-09-01 DIAGNOSIS — Z20822 Contact with and (suspected) exposure to covid-19: Secondary | ICD-10-CM | POA: Diagnosis not present

## 2019-09-01 LAB — SARS CORONAVIRUS 2 (TAT 6-24 HRS): SARS Coronavirus 2: NEGATIVE

## 2019-09-02 ENCOUNTER — Ambulatory Visit: Payer: Medicare HMO | Admitting: Family Medicine

## 2019-09-02 ENCOUNTER — Other Ambulatory Visit: Payer: Self-pay | Admitting: Family Medicine

## 2019-09-02 DIAGNOSIS — R0600 Dyspnea, unspecified: Secondary | ICD-10-CM

## 2019-09-04 ENCOUNTER — Ambulatory Visit (INDEPENDENT_AMBULATORY_CARE_PROVIDER_SITE_OTHER): Payer: Medicare HMO | Admitting: Pulmonary Disease

## 2019-09-04 ENCOUNTER — Other Ambulatory Visit: Payer: Self-pay

## 2019-09-04 DIAGNOSIS — J432 Centrilobular emphysema: Secondary | ICD-10-CM

## 2019-09-04 LAB — PULMONARY FUNCTION TEST
DL/VA % pred: 73 %
DL/VA % pred: 73 %
DL/VA: 3.04 ml/min/mmHg/L
DL/VA: 3.04 ml/min/mmHg/L
DLCO cor % pred: 73 %
DLCO cor % pred: 73 %
DLCO cor: 15.82 ml/min/mmHg
DLCO cor: 15.82 ml/min/mmHg
DLCO unc % pred: 73 %
DLCO unc % pred: 73 %
DLCO unc: 15.82 ml/min/mmHg
DLCO unc: 15.82 ml/min/mmHg
FEF 25-75 Post: 2.24 L/sec
FEF 25-75 Post: 2.24 L/sec
FEF 25-75 Pre: 1.63 L/sec
FEF 25-75 Pre: 1.63 L/sec
FEF2575-%Change-Post: 37 %
FEF2575-%Change-Post: 37 %
FEF2575-%Pred-Post: 91 %
FEF2575-%Pred-Post: 91 %
FEF2575-%Pred-Pre: 66 %
FEF2575-%Pred-Pre: 66 %
FEV1-%Change-Post: 6 %
FEV1-%Change-Post: 6 %
FEV1-%Pred-Post: 91 %
FEV1-%Pred-Post: 91 %
FEV1-%Pred-Pre: 85 %
FEV1-%Pred-Pre: 85 %
FEV1-Post: 2.48 L
FEV1-Post: 2.48 L
FEV1-Pre: 2.33 L
FEV1-Pre: 2.33 L
FEV1FVC-%Change-Post: 4 %
FEV1FVC-%Change-Post: 4 %
FEV1FVC-%Pred-Pre: 94 %
FEV1FVC-%Pred-Pre: 94 %
FEV6-%Change-Post: 2 %
FEV6-%Change-Post: 2 %
FEV6-%Pred-Post: 95 %
FEV6-%Pred-Post: 95 %
FEV6-%Pred-Pre: 92 %
FEV6-%Pred-Pre: 92 %
FEV6-Post: 3.23 L
FEV6-Post: 3.23 L
FEV6-Pre: 3.16 L
FEV6-Pre: 3.16 L
FEV6FVC-%Change-Post: 0 %
FEV6FVC-%Change-Post: 0 %
FEV6FVC-%Pred-Post: 103 %
FEV6FVC-%Pred-Post: 103 %
FEV6FVC-%Pred-Pre: 103 %
FEV6FVC-%Pred-Pre: 103 %
FVC-%Change-Post: 1 %
FVC-%Change-Post: 1 %
FVC-%Pred-Post: 91 %
FVC-%Pred-Post: 91 %
FVC-%Pred-Pre: 90 %
FVC-%Pred-Pre: 90 %
FVC-Post: 3.24 L
FVC-Post: 3.24 L
FVC-Pre: 3.18 L
Post FEV1/FVC ratio: 77 %
Post FEV1/FVC ratio: 77 %
Post FEV6/FVC ratio: 100 %
Post FEV6/FVC ratio: 100 %
Pre FEV1/FVC ratio: 73 %
Pre FEV1/FVC ratio: 73 %
Pre FEV6/FVC Ratio: 99 %
Pre FEV6/FVC Ratio: 99 %

## 2019-09-04 NOTE — Progress Notes (Signed)
PFT done today. 

## 2019-09-09 NOTE — Progress Notes (Signed)
Patient identification verified. Per Wyn Quaker FNP, I have reviewed patient's pulmonary function test patient's pulmonary function test does not show any difficulty taking a deep breath in or getting air out. She has slightly reduced at how well she processes oxygen.  No new recommendations or changes.  Keep planned follow-up in May/2021 with Dr. Vaughan Browner Patient verbalized understanding of results and plan of care.

## 2019-09-10 ENCOUNTER — Telehealth: Payer: Self-pay | Admitting: *Deleted

## 2019-09-10 NOTE — Telephone Encounter (Signed)
PA form filled out and faxed back to [facility] for fluticasone/salmetrol. Awaiting response.

## 2019-09-11 ENCOUNTER — Other Ambulatory Visit: Payer: Self-pay

## 2019-09-11 ENCOUNTER — Ambulatory Visit (HOSPITAL_COMMUNITY)
Admission: RE | Admit: 2019-09-11 | Discharge: 2019-09-11 | Disposition: A | Discharge status: Home / Self Care | Payer: Medicare HMO | Source: Ambulatory Visit | Attending: Acute Care | Admitting: Acute Care

## 2019-09-11 DIAGNOSIS — Z87891 Personal history of nicotine dependence: Secondary | ICD-10-CM | POA: Diagnosis not present

## 2019-09-11 NOTE — Telephone Encounter (Signed)
PA approved 05/30/19-05/28/20. Pharmacy and patient aware.

## 2019-09-12 NOTE — Progress Notes (Signed)
Please call patient and let them  know their  low dose Ct was [place] as a Lung RADS 1, negative study: no nodules or definitely benign nodules. Radiology recommendation is for a repeat LDCT in 12 months. Please let them  know we will order and schedule their  annual screening scan for 08/2019. Please let them  know there was notation of CAD on their  scan.  Please remind the patient  that this is a non-gated exam therefore degree or severity of disease  cannot be determined. Please have them  follow up with their PCP regarding potential risk factor modification, dietary therapy or pharmacologic therapy if clinically indicated. Pt.  is  currently on statin therapy. Please place order for annual  screening scan for  08/2020 and fax results to PCP. Thanks so much.

## 2019-09-16 ENCOUNTER — Other Ambulatory Visit: Payer: Self-pay | Admitting: *Deleted

## 2019-09-16 DIAGNOSIS — Z87891 Personal history of nicotine dependence: Secondary | ICD-10-CM

## 2019-09-23 DIAGNOSIS — G894 Chronic pain syndrome: Secondary | ICD-10-CM | POA: Diagnosis not present

## 2019-09-23 DIAGNOSIS — M961 Postlaminectomy syndrome, not elsewhere classified: Secondary | ICD-10-CM | POA: Diagnosis not present

## 2019-09-23 DIAGNOSIS — M47812 Spondylosis without myelopathy or radiculopathy, cervical region: Secondary | ICD-10-CM | POA: Diagnosis not present

## 2019-09-23 DIAGNOSIS — M47814 Spondylosis without myelopathy or radiculopathy, thoracic region: Secondary | ICD-10-CM | POA: Diagnosis not present

## 2019-09-23 NOTE — Progress Notes (Signed)
Cardiology Office Note:    Date:  09/24/2019   ID:  Sarah Chapman, DOB 1961-03-15, MRN [id number]  PCP:  Claretta Fraise, MD  Cardiologist:  No primary care provider on file.  Electrophysiologist:  None   Referring MD: Claretta Fraise, MD   Chief Complaint  Patient presents with  . Shortness of Breath   History of Present Illness:    Sarah Chapman is a 59 y.o. female with a hx of hypertension, COPD, hyperlipidemia, OSA who is referred by Dr. Livia Snellen for evaluation of dyspnea.  States that she feels short of breath with minimal exertion.  Reports that walking up stairs or even across the room causes her symptoms.  Also reports that she has been having chest pain daily.  Describes as left-sided chest tightness.  Can last for hours.  Can occur at rest.  She has not been exercising.  Reports has gained 10 pounds in the last year.  She quit smoking 3 years ago, smoked 1 pack/day x 22 years.  No history of heart disease in her immediate family.   Past Medical History:  Diagnosis Date  . Allergic rhinitis   . Back pain   . Chronic headache   . COPD (chronic obstructive pulmonary disease) ([place])   . Emphysema of lung ([place])   . Hyperlipidemia   . Hypertension   . Neck pain   . Sleep apnea   . Vaginal Pap smear, abnormal     Past Surgical History:  Procedure Laterality Date  . CESAREAN SECTION    . GYNECOLOGIC CRYOSURGERY    . NECK SURGERY    . TONSILLECTOMY      Current Medications: Current Meds  Medication Sig  . ADVAIR DISKUS 250-50 MCG/DOSE AEPB 1 PUFF TWICE DAILY. RINSE MOUTH WITH WATER AFTER EACH USE.  Marland Kitchen albuterol (VENTOLIN HFA) 108 (90 Base) MCG/ACT inhaler Inhale 2 puffs into the lungs every 6 (six) hours as needed for wheezing or shortness of breath.  . ALPRAZolam (XANAX) 0.5 MG tablet Take 1 tablet by mouth as needed.   Marland Kitchen aspirin 81 MG EC tablet Take 1 tablet by mouth daily.  Marland Kitchen buPROPion (WELLBUTRIN XL) 300 MG 24 hr tablet Take 1 tablet by mouth daily.  . celecoxib (CELEBREX)  100 MG capsule Take 100 mg by mouth 2 (two) times daily.   Marland Kitchen dexlansoprazole (DEXILANT) 60 MG capsule TAKE (1) CAPSULE DAILY  . DULoxetine (CYMBALTA) 30 MG capsule Take 30 mg by mouth 2 (two) times daily.  Marland Kitchen gabapentin (NEURONTIN) 300 MG capsule Take 300 mg by mouth 2 (two) times daily.  Marland Kitchen HYDROcodone-acetaminophen (NORCO) 10-325 MG tablet Take 1 tablet by mouth 4 (four) times daily as needed.  . loratadine (CLARITIN) 10 MG tablet Take 10 mg by mouth daily as needed for allergies.  Marland Kitchen nortriptyline (PAMELOR) 10 MG capsule Take 10 mg by mouth at bedtime.   Marland Kitchen NUVIGIL 250 MG tablet Take 250 mg by mouth daily.  Marland Kitchen olmesartan (BENICAR) 40 MG tablet Take 1 tablet (40 mg total) by mouth daily.  . procarbazine (MATULANE) 50 MG capsule Take 50 mg by mouth daily. Follow low tyramine diet.  Marland Kitchen pyridostigmine (MESTINON) 60 MG tablet Take 30-60 mg by mouth 2 (two) times daily.  . Sennosides (PERDIEM OVERNIGHT RELIEF PO) Take by mouth at bedtime.  . simvastatin (ZOCOR) 10 MG tablet Take 10 mg by mouth daily.  Marland Kitchen tiZANidine (ZANAFLEX) 4 MG tablet Take 1 tablet by mouth at bedtime.     Allergies:  Clindamycin/lincomycin and Penicillins   Social History   Socioeconomic History  . Marital status: Divorced    Spouse name: Not on file  . Number of children: Not on file  . Years of education: Not on file  . Highest education level: Not on file  Occupational History  . Not on file  Tobacco Use  . Smoking status: Former Smoker    Packs/day: 1.00    Years: 33.00    Pack years: 33.00    Types: Cigarettes    Quit date: 07/25/2016    Years since quitting: 3.1  . Smokeless tobacco: Never Used  Substance and Sexual Activity  . Alcohol use: No  . Drug use: No  . Sexual activity: Not Currently    Birth control/protection: Post-menopausal  Other Topics Concern  . Not on file  Social History Narrative  . Not on file   Social Determinants of Health   Financial Resource Strain:   . Difficulty of Paying  Living Expenses:   Food Insecurity:   . Worried About Charity fundraiser in the Last Year:   . Arboriculturist in the Last Year:   Transportation Needs:   . Film/video editor (Medical):   Marland Kitchen Lack of Transportation (Non-Medical):   Physical Activity:   . Days of Exercise per Week:   . Minutes of Exercise per Session:   Stress:   . Feeling of Stress :   Social Connections:   . Frequency of Communication with Friends and Family:   . Frequency of Social Gatherings with Friends and Family:   . Attends Religious Services:   . Active Member of Clubs or Organizations:   . Attends Archivist Meetings:   Marland Kitchen Marital Status:      Family History: The patient's family history includes Bone cancer in her mother; Cancer in her maternal grandmother; Colon cancer (age of onset: 84) in her brother; Heart attack in her brother; Mental illness in her son.  ROS:   Please see the history of present illness.     All other systems reviewed and are negative.  EKGs/Labs/Other Studies Reviewed:    The following studies were reviewed today:   EKG:  EKG is ordered today.  The ekg ordered today demonstrates normal sinus rhythm, rate 76, no ST/T abnormalities  Recent Labs: No results found for requested labs within last 8760 hours.  Recent Lipid Panel    Component Value Date/Time   CHOL 246 (H) 07/19/2016 0815   TRIG 152 (H) 07/19/2016 0815   HDL 115 07/19/2016 0815   CHOLHDL 2.1 07/19/2016 0815   LDLCALC 101 (H) 07/19/2016 0815    Physical Exam:    VS:  BP 125/81   Pulse 76   Temp (!) 97 F (36.1 C)   Ht 5\' 5"  (1.651 m)   Wt 175 lb 6.4 oz (79.6 kg)   SpO2 97%   BMI 29.19 kg/m     Wt Readings from Last 3 Encounters:  09/24/19 175 lb 6.4 oz (79.6 kg)  08/25/19 174 lb (78.9 kg)  08/13/19 174 lb (78.9 kg)     GEN:  in no acute distress HEENT: Normal NECK: No JVD; No carotid bruits LYMPHATICS: No lymphadenopathy CARDIAC: RRR, no murmurs, rubs, gallops RESPIRATORY:   Clear to auscultation without rales, wheezing or rhonchi  ABDOMEN: Soft, non-tender, non-distended MUSCULOSKELETAL:  No edema; No deformity  SKIN: Warm and dry NEUROLOGIC:  Alert and oriented x 3 PSYCHIATRIC:  Normal affect   ASSESSMENT:  1. Chest pain of uncertain etiology   2. Hyperlipidemia, unspecified hyperlipidemia type    PLAN:    In order of problems listed above:  Chest pain/dyspnea on exertion: chest pain is atypical in description, but given her risk factors (age, hypertension, hyperlipidemia, smoking history) would classify as intermediate risk of obstructive coronary disease. -Coronary CTA  Hyperlipidemia: On simvastatin 10 mg daily.  Will check lipid panel.  Hypertension: Appears controlled.  We will continue olmesartan 40 mg daily  RTC in 3 months   Medication Adjustments/Labs and Tests Ordered: Current medicines are reviewed at length with the patient today.  Concerns regarding medicines are outlined above.  Orders Placed This Encounter  Procedures  . CT CORONARY MORPH W/CTA COR W/SCORE W/CA W/CM &/OR WO/CM  . CT CORONARY FRACTIONAL FLOW RESERVE DATA PREP  . CT CORONARY FRACTIONAL FLOW RESERVE FLUID ANALYSIS  . Basic metabolic panel  . Lipid panel  . EKG 12-Lead  . ECHOCARDIOGRAM COMPLETE   Meds ordered this encounter  Medications  . metoprolol tartrate (LOPRESSOR) 50 MG tablet    Sig: Take 50 mg (1 tablet) TWO hours prior to CT    Dispense:  1 tablet    Refill:  0    Patient Instructions  Medication Instructions:  Your physician recommends that you continue on your current medications as directed. Please refer to the Current Medication list given to you today.  *If you need a refill on your cardiac medications before your next appointment, please call your pharmacy*   Lab Work: (BMET, Lipid)-FASTING  If you have labs (blood work) drawn today and your tests are completely normal, you will receive your results only by: Marland Kitchen MyChart Message (if  you have MyChart) OR . A paper copy in the mail If you have any lab test that is abnormal or we need to change your treatment, we will call you to review the results.   Testing/Procedures: Your physician has requested that you have an echocardiogram. Echocardiography is a painless test that uses sound waves to create images of your heart. It provides your doctor with information about the size and shape of your heart and how well your heart's chambers and valves are working. This procedure takes approximately one hour. There are no restrictions for this procedure. This will be done at our [facility] location:  [place] has requested that you have cardiac CT. Cardiac computed tomography (CT) is a painless test that uses an x-ray machine to take clear, detailed pictures of your heart. For further information please visit [email or web address]. Please follow instruction sheet as given.  Follow-Up: At [facility], you and your health needs are our priority.  As part of our continuing mission to provide you with exceptional heart care, we have created designated Provider Care Teams.  These Care Teams include your primary Cardiologist (physician) and Advanced Practice Providers (APPs -  Physician Assistants and Nurse Practitioners) who all work together to provide you with the care you need, when you need it.  We recommend signing up for the patient portal called "MyChart".  Sign up information is provided on this After Visit Summary.  MyChart is used to connect with patients for Virtual Visits (Telemedicine).  Patients are able to view lab/test results, encounter notes, upcoming appointments, etc.  Non-urgent messages can be sent to your provider as well.   To learn more about what you can do with MyChart, go to [email or web address].    Your next appointment:  3 month(s)  The format for your next appointment:   In Person  Provider:   Oswaldo Milian, MD       Signed, Donato Heinz, MD  09/24/2019 9:20 PM    [place]

## 2019-09-24 ENCOUNTER — Other Ambulatory Visit: Payer: Self-pay

## 2019-09-24 ENCOUNTER — Ambulatory Visit: Payer: Medicare HMO | Admitting: Cardiology

## 2019-09-24 VITALS — BP 125/81 | HR 76 | Temp 97.0°F | Ht 65.0 in | Wt 175.4 lb

## 2019-09-24 DIAGNOSIS — I10 Essential (primary) hypertension: Secondary | ICD-10-CM

## 2019-09-24 DIAGNOSIS — E785 Hyperlipidemia, unspecified: Secondary | ICD-10-CM

## 2019-09-24 DIAGNOSIS — R079 Chest pain, unspecified: Secondary | ICD-10-CM | POA: Diagnosis not present

## 2019-09-24 MED ORDER — METOPROLOL TARTRATE 50 MG PO TABS: ORAL_TABLET | ORAL | 0 refills | Status: DC

## 2019-09-24 NOTE — Patient Instructions (Signed)
Medication Instructions:  Your physician recommends that you continue on your current medications as directed. Please refer to the Current Medication list given to you today.  *If you need a refill on your cardiac medications before your next appointment, please call your pharmacy*   Lab Work: (BMET, Lipid)-FASTING  If you have labs (blood work) drawn today and your tests are completely normal, you will receive your results only by: Marland Kitchen MyChart Message (if you have MyChart) OR . A paper copy in the mail If you have any lab test that is abnormal or we need to change your treatment, we will call you to review the results.   Testing/Procedures: Your physician has requested that you have an echocardiogram. Echocardiography is a painless test that uses sound waves to create images of your heart. It provides your doctor with information about the size and shape of your heart and how well your heart's chambers and valves are working. This procedure takes approximately one hour. There are no restrictions for this procedure. This will be done at our [facility] location:  [place] has requested that you have cardiac CT. Cardiac computed tomography (CT) is a painless test that uses an x-ray machine to take clear, detailed pictures of your heart. For further information please visit [email or web address]. Please follow instruction sheet as given.  Follow-Up: At [facility], you and your health needs are our priority.  As part of our continuing mission to provide you with exceptional heart care, we have created designated Provider Care Teams.  These Care Teams include your primary Cardiologist (physician) and Advanced Practice Providers (APPs -  Physician Assistants and Nurse Practitioners) who all work together to provide you with the care you need, when you need it.  We recommend signing up for the patient portal called "MyChart".  Sign up information is provided  on this After Visit Summary.  MyChart is used to connect with patients for Virtual Visits (Telemedicine).  Patients are able to view lab/test results, encounter notes, upcoming appointments, etc.  Non-urgent messages can be sent to your provider as well.   To learn more about what you can do with MyChart, go to [email or web address].    Your next appointment:   3 month(s)  The format for your next appointment:   In Person  Provider:   Oswaldo Milian, MD

## 2019-09-30 ENCOUNTER — Other Ambulatory Visit: Payer: Self-pay

## 2019-09-30 ENCOUNTER — Ambulatory Visit (HOSPITAL_COMMUNITY)
Admission: RE | Admit: 2019-09-30 | Discharge: 2019-09-30 | Disposition: A | Discharge status: Home / Self Care | Payer: Medicare HMO | Source: Ambulatory Visit | Attending: Cardiology | Admitting: Cardiology

## 2019-09-30 DIAGNOSIS — R079 Chest pain, unspecified: Secondary | ICD-10-CM | POA: Insufficient documentation

## 2019-09-30 NOTE — Progress Notes (Signed)
*  PRELIMINARY RESULTS* Echocardiogram 2D Echocardiogram has been performed.  Sarah Chapman 09/30/2019, 2:37 PM

## 2019-10-01 DIAGNOSIS — M546 Pain in thoracic spine: Secondary | ICD-10-CM | POA: Diagnosis not present

## 2019-10-01 DIAGNOSIS — M5136 Other intervertebral disc degeneration, lumbar region: Secondary | ICD-10-CM | POA: Diagnosis not present

## 2019-10-01 DIAGNOSIS — M47814 Spondylosis without myelopathy or radiculopathy, thoracic region: Secondary | ICD-10-CM | POA: Diagnosis not present

## 2019-10-01 DIAGNOSIS — G894 Chronic pain syndrome: Secondary | ICD-10-CM | POA: Diagnosis not present

## 2019-10-01 DIAGNOSIS — M47812 Spondylosis without myelopathy or radiculopathy, cervical region: Secondary | ICD-10-CM | POA: Diagnosis not present

## 2019-10-01 DIAGNOSIS — M25569 Pain in unspecified knee: Secondary | ICD-10-CM | POA: Diagnosis not present

## 2019-10-07 DIAGNOSIS — E785 Hyperlipidemia, unspecified: Secondary | ICD-10-CM | POA: Diagnosis not present

## 2019-10-07 DIAGNOSIS — R079 Chest pain, unspecified: Secondary | ICD-10-CM | POA: Diagnosis not present

## 2019-10-08 LAB — BASIC METABOLIC PANEL
BUN/Creatinine Ratio: 22 (ref 12–28)
BUN: 23 mg/dL (ref 8–27)
CO2: 23 mmol/L (ref 20–29)
Calcium: 9.9 mg/dL (ref 8.7–10.3)
Chloride: 100 mmol/L (ref 96–106)
Creatinine, Ser: 1.06 mg/dL — ABNORMAL HIGH (ref 0.57–1.00)
GFR calc Af Amer: 66 mL/min/{1.73_m2} (ref >59)
GFR calc non Af Amer: 57 mL/min/{1.73_m2} — ABNORMAL LOW (ref >59)
Glucose: 78 mg/dL (ref 65–99)
Potassium: 4.6 mmol/L (ref 3.5–5.2)
Sodium: 141 mmol/L (ref 134–144)

## 2019-10-08 LAB — LIPID PANEL
Chol/HDL Ratio: 2.4 ratio (ref 0.0–4.4)
Cholesterol, Total: 278 mg/dL — ABNORMAL HIGH (ref 100–199)
HDL: 116 mg/dL (ref >39)
LDL Chol Calc (NIH): 146 mg/dL — ABNORMAL HIGH (ref 0–99)
Triglycerides: 99 mg/dL (ref 0–149)
VLDL Cholesterol Cal: 16 mg/dL (ref 5–40)

## 2019-10-10 DIAGNOSIS — F900 Attention-deficit hyperactivity disorder, predominantly inattentive type: Secondary | ICD-10-CM | POA: Diagnosis not present

## 2019-10-10 DIAGNOSIS — G8921 Chronic pain due to trauma: Secondary | ICD-10-CM | POA: Diagnosis not present

## 2019-10-10 DIAGNOSIS — R69 Illness, unspecified: Secondary | ICD-10-CM | POA: Diagnosis not present

## 2019-10-10 DIAGNOSIS — F411 Generalized anxiety disorder: Secondary | ICD-10-CM | POA: Diagnosis not present

## 2019-10-13 DIAGNOSIS — G8921 Chronic pain due to trauma: Secondary | ICD-10-CM | POA: Diagnosis not present

## 2019-10-13 DIAGNOSIS — F900 Attention-deficit hyperactivity disorder, predominantly inattentive type: Secondary | ICD-10-CM | POA: Diagnosis not present

## 2019-10-13 DIAGNOSIS — F411 Generalized anxiety disorder: Secondary | ICD-10-CM | POA: Diagnosis not present

## 2019-10-13 DIAGNOSIS — R69 Illness, unspecified: Secondary | ICD-10-CM | POA: Diagnosis not present

## 2019-10-21 DIAGNOSIS — F411 Generalized anxiety disorder: Secondary | ICD-10-CM | POA: Diagnosis not present

## 2019-10-21 DIAGNOSIS — R69 Illness, unspecified: Secondary | ICD-10-CM | POA: Diagnosis not present

## 2019-10-21 DIAGNOSIS — G8921 Chronic pain due to trauma: Secondary | ICD-10-CM | POA: Diagnosis not present

## 2019-10-21 DIAGNOSIS — F900 Attention-deficit hyperactivity disorder, predominantly inattentive type: Secondary | ICD-10-CM | POA: Diagnosis not present

## 2019-10-22 ENCOUNTER — Telehealth (HOSPITAL_COMMUNITY): Payer: Self-pay | Admitting: *Deleted

## 2019-10-22 DIAGNOSIS — F900 Attention-deficit hyperactivity disorder, predominantly inattentive type: Secondary | ICD-10-CM | POA: Diagnosis not present

## 2019-10-22 DIAGNOSIS — F411 Generalized anxiety disorder: Secondary | ICD-10-CM | POA: Diagnosis not present

## 2019-10-22 DIAGNOSIS — G8921 Chronic pain due to trauma: Secondary | ICD-10-CM | POA: Diagnosis not present

## 2019-10-22 DIAGNOSIS — R69 Illness, unspecified: Secondary | ICD-10-CM | POA: Diagnosis not present

## 2019-10-22 NOTE — Telephone Encounter (Signed)
Reaching out to patient to offer assistance regarding upcoming cardiac imaging study; pt verbalizes understanding of appt date/time, parking situation and where to check in, pre-test NPO status and medications ordered, and verified current allergies; name and call back number provided for further questions should they arise  [place] [facility] [phone] office [phone] cell  Xanax encouraged and pt informed that she would need a driver if she were to take her Xanax.  Pt expressed understanding.

## 2019-10-23 ENCOUNTER — Ambulatory Visit (HOSPITAL_COMMUNITY)
Admission: RE | Admit: 2019-10-23 | Discharge: 2019-10-23 | Disposition: A | Discharge status: Home / Self Care | Payer: Medicare HMO | Source: Ambulatory Visit | Attending: Cardiology | Admitting: Cardiology

## 2019-10-23 DIAGNOSIS — R079 Chest pain, unspecified: Secondary | ICD-10-CM

## 2019-10-23 DIAGNOSIS — I700 Atherosclerosis of aorta: Secondary | ICD-10-CM | POA: Diagnosis not present

## 2019-10-23 DIAGNOSIS — Z6829 Body mass index (BMI) 29.0-29.9, adult: Secondary | ICD-10-CM | POA: Diagnosis not present

## 2019-10-23 DIAGNOSIS — K449 Diaphragmatic hernia without obstruction or gangrene: Secondary | ICD-10-CM | POA: Diagnosis not present

## 2019-10-23 DIAGNOSIS — I2510 Atherosclerotic heart disease of native coronary artery without angina pectoris: Secondary | ICD-10-CM | POA: Diagnosis not present

## 2019-10-23 MED ORDER — NITROGLYCERIN 0.4 MG SL SUBL: 0.8000 mg | SUBLINGUAL_TABLET | Freq: Once | SUBLINGUAL | Status: DC

## 2019-10-23 MED ORDER — IOHEXOL 350 MG/ML SOLN
80.0000 mL | Freq: Once | INTRAVENOUS | Status: AC | PRN
  Administered 2019-10-23: 80 mL via INTRAVENOUS

## 2019-10-23 MED ORDER — METOPROLOL TARTRATE 5 MG/5ML IV SOLN: 5.0000 mg | INTRAVENOUS | Status: DC | PRN

## 2019-10-24 DIAGNOSIS — G894 Chronic pain syndrome: Secondary | ICD-10-CM | POA: Diagnosis not present

## 2019-10-24 DIAGNOSIS — Z79899 Other long term (current) drug therapy: Secondary | ICD-10-CM | POA: Diagnosis not present

## 2019-10-24 DIAGNOSIS — M961 Postlaminectomy syndrome, not elsewhere classified: Secondary | ICD-10-CM | POA: Diagnosis not present

## 2019-10-24 DIAGNOSIS — M47812 Spondylosis without myelopathy or radiculopathy, cervical region: Secondary | ICD-10-CM | POA: Diagnosis not present

## 2019-10-24 DIAGNOSIS — Z79891 Long term (current) use of opiate analgesic: Secondary | ICD-10-CM | POA: Diagnosis not present

## 2019-10-24 DIAGNOSIS — M546 Pain in thoracic spine: Secondary | ICD-10-CM | POA: Diagnosis not present

## 2019-11-04 ENCOUNTER — Telehealth: Payer: Self-pay | Admitting: Cardiology

## 2019-11-04 MED ORDER — ATORVASTATIN CALCIUM 40 MG PO TABS
40.0000 mg | ORAL_TABLET | Freq: Every day | ORAL | 3 refills | Status: DC
Start: 2019-11-04 — End: 2020-03-24

## 2019-11-04 NOTE — Telephone Encounter (Signed)
Patient is returning Hayley's call, to discuss CT results. Please call.

## 2019-11-04 NOTE — Telephone Encounter (Signed)
Follow Up:    Pt said she talked to [facility] earlier today and she needs to talk to her again please.

## 2019-11-04 NOTE — Telephone Encounter (Signed)
Spoke with pt re message and gave pt clarity on recommendations from Ct results Pt understands and agrees with plan ./cy

## 2019-11-04 NOTE — Telephone Encounter (Signed)
Sarah Heinz, MD  10/26/2019 4:10 PM EDT    No significant blockages in heart arteries seen, but does have significant amount of plaque (calcium score 411, 98th percentile). Recommend changing from simvastatin to atorvastatin 40 mg daily     Patient aware and verbalized understanding.  rx sent to pharmacy.  Patient aware of OV 7/27

## 2019-11-17 DIAGNOSIS — M47814 Spondylosis without myelopathy or radiculopathy, thoracic region: Secondary | ICD-10-CM | POA: Diagnosis not present

## 2019-11-17 DIAGNOSIS — M5136 Other intervertebral disc degeneration, lumbar region: Secondary | ICD-10-CM | POA: Diagnosis not present

## 2019-11-17 DIAGNOSIS — M47812 Spondylosis without myelopathy or radiculopathy, cervical region: Secondary | ICD-10-CM | POA: Diagnosis not present

## 2019-11-17 DIAGNOSIS — G894 Chronic pain syndrome: Secondary | ICD-10-CM | POA: Diagnosis not present

## 2019-11-25 DIAGNOSIS — H02403 Unspecified ptosis of bilateral eyelids: Secondary | ICD-10-CM | POA: Diagnosis not present

## 2019-11-25 DIAGNOSIS — G518 Other disorders of facial nerve: Secondary | ICD-10-CM | POA: Diagnosis not present

## 2019-11-25 DIAGNOSIS — G43719 Chronic migraine without aura, intractable, without status migrainosus: Secondary | ICD-10-CM | POA: Diagnosis not present

## 2019-11-25 DIAGNOSIS — M5412 Radiculopathy, cervical region: Secondary | ICD-10-CM | POA: Diagnosis not present

## 2019-12-15 DIAGNOSIS — M47814 Spondylosis without myelopathy or radiculopathy, thoracic region: Secondary | ICD-10-CM | POA: Diagnosis not present

## 2019-12-15 DIAGNOSIS — M47812 Spondylosis without myelopathy or radiculopathy, cervical region: Secondary | ICD-10-CM | POA: Diagnosis not present

## 2019-12-15 DIAGNOSIS — G894 Chronic pain syndrome: Secondary | ICD-10-CM | POA: Diagnosis not present

## 2019-12-15 DIAGNOSIS — M25569 Pain in unspecified knee: Secondary | ICD-10-CM | POA: Diagnosis not present

## 2019-12-16 DIAGNOSIS — R69 Illness, unspecified: Secondary | ICD-10-CM | POA: Diagnosis not present

## 2019-12-16 DIAGNOSIS — F411 Generalized anxiety disorder: Secondary | ICD-10-CM | POA: Diagnosis not present

## 2019-12-16 DIAGNOSIS — G8921 Chronic pain due to trauma: Secondary | ICD-10-CM | POA: Diagnosis not present

## 2019-12-16 DIAGNOSIS — F900 Attention-deficit hyperactivity disorder, predominantly inattentive type: Secondary | ICD-10-CM | POA: Diagnosis not present

## 2019-12-18 ENCOUNTER — Other Ambulatory Visit: Payer: Self-pay | Admitting: Family Medicine

## 2019-12-18 DIAGNOSIS — I10 Essential (primary) hypertension: Secondary | ICD-10-CM

## 2019-12-22 NOTE — Progress Notes (Signed)
Cardiology Office Note:    Date:  12/23/2019   ID:  Sarah Chapman, DOB Aug 04, 1960, MRN [id number]  PCP:  Claretta Fraise, MD  Cardiologist:  Donato Heinz, MD  Electrophysiologist:  None   Referring MD: Claretta Fraise, MD   Chief Complaint  Patient presents with  . Shortness of Breath   History of Present Illness:    Sarah Chapman is a 59 y.o. female with a hx of hypertension, COPD, hyperlipidemia, OSA who presents for follow-up.  She was referred by Dr. Livia Snellen for evaluation of dyspnea, initially seen on 09/24/2019.  States that she feels short of breath with minimal exertion.  Reports that walking up stairs or even across the room causes her symptoms.  Also reports that she has been having chest pain daily.  Describes as left-sided chest tightness.  Can last for hours.  Can occur at rest.  She has not been exercising.  Reports has gained 10 pounds in the last year.  She quit smoking 3 years ago, smoked 1 pack/day x 22 years.  No history of heart disease in her immediate family.  Coronary CTA on 10/23/2019 showed calcium score 411 (98th percentile), nonobstructive CAD with mixed plaque in proximal LAD causing 50 to 69% stenosis (CT FFR 0.84), calcified plaque in proximal circumflex causing 0 to 24% stenosis, mid next plaque in the mid circumflex causing 25 to 49% stenosis (CT FFR 0.82), mixed plaque in proximal and mid RCA causing 25 to 49% stenosis).  Since last clinic visit, she reports that she continues to have shortness of breath but her chest pain has improved.  She has been exercising regularly, went to gym last week and walked about 1/3 mile.  She denies any syncope, lightheadedness, lower extremity edema, or palpitations.   Past Medical History:  Diagnosis Date  . Allergic rhinitis   . Back pain   . Chronic headache   . COPD (chronic obstructive pulmonary disease) ([place])   . Emphysema of lung ([place])   . Hyperlipidemia   . Hypertension   . Neck pain   . Sleep apnea   .  Vaginal Pap smear, abnormal     Past Surgical History:  Procedure Laterality Date  . CESAREAN SECTION    . GYNECOLOGIC CRYOSURGERY    . NECK SURGERY    . TONSILLECTOMY      Current Medications: Current Meds  Medication Sig  . ADVAIR DISKUS 250-50 MCG/DOSE AEPB 1 PUFF TWICE DAILY. RINSE MOUTH WITH WATER AFTER EACH USE.  Marland Kitchen albuterol (VENTOLIN HFA) 108 (90 Base) MCG/ACT inhaler Inhale 2 puffs into the lungs every 6 (six) hours as needed for wheezing or shortness of breath.  . ALPRAZolam (XANAX) 0.5 MG tablet Take 1 tablet by mouth as needed.   Marland Kitchen aspirin 81 MG EC tablet Take 1 tablet by mouth daily.  Marland Kitchen atorvastatin (LIPITOR) 40 MG tablet Take 1 tablet (40 mg total) by mouth daily.  Marland Kitchen BOTOX 200 units SOLR TO BE GIVEN BY PROVIDER IN OFFICE EVERY 90 DAYS  . buPROPion (WELLBUTRIN XL) 300 MG 24 hr tablet Take 1 tablet by mouth daily.  . celecoxib (CELEBREX) 100 MG capsule Take 100 mg by mouth 2 (two) times daily.   Marland Kitchen dexlansoprazole (DEXILANT) 60 MG capsule TAKE (1) CAPSULE DAILY  . DULoxetine (CYMBALTA) 30 MG capsule Take 30 mg by mouth 2 (two) times daily.  Marland Kitchen HYDROcodone-acetaminophen (NORCO) 10-325 MG tablet Take 1 tablet by mouth 4 (four) times daily as needed.  . loratadine (CLARITIN)  10 MG tablet Take 10 mg by mouth daily as needed for allergies.  Marland Kitchen nortriptyline (PAMELOR) 10 MG capsule Take 10 mg by mouth at bedtime.   . NURTEC 75 MG TBDP Take by mouth.  Marland Kitchen NUVIGIL 250 MG tablet Take 250 mg by mouth daily.  Marland Kitchen olmesartan (BENICAR) 40 MG tablet Take 1 tablet (40 mg total) by mouth daily. (Needs to be seen before next refill)  . procarbazine (MATULANE) 50 MG capsule Take 50 mg by mouth daily. Follow low tyramine diet.  Marland Kitchen pyridostigmine (MESTINON) 60 MG tablet Take 30-60 mg by mouth 2 (two) times daily.  . Sennosides (PERDIEM OVERNIGHT RELIEF PO) Take by mouth at bedtime.  Marland Kitchen tiZANidine (ZANAFLEX) 4 MG tablet Take 1 tablet by mouth at bedtime.     Allergies:   Clindamycin/lincomycin and  Penicillins   Social History   Socioeconomic History  . Marital status: Divorced    Spouse name: Not on file  . Number of children: Not on file  . Years of education: Not on file  . Highest education level: Not on file  Occupational History  . Not on file  Tobacco Use  . Smoking status: Former Smoker    Packs/day: 1.00    Years: 33.00    Pack years: 33.00    Types: Cigarettes    Quit date: 07/25/2016    Years since quitting: 3.4  . Smokeless tobacco: Never Used  Vaping Use  . Vaping Use: Never used  Substance and Sexual Activity  . Alcohol use: No  . Drug use: No  . Sexual activity: Not Currently    Birth control/protection: Post-menopausal  Other Topics Concern  . Not on file  Social History Narrative  . Not on file   Social Determinants of Health   Financial Resource Strain:   . Difficulty of Paying Living Expenses:   Food Insecurity:   . Worried About Charity fundraiser in the Last Year:   . Arboriculturist in the Last Year:   Transportation Needs:   . Film/video editor (Medical):   Marland Kitchen Lack of Transportation (Non-Medical):   Physical Activity:   . Days of Exercise per Week:   . Minutes of Exercise per Session:   Stress:   . Feeling of Stress :   Social Connections:   . Frequency of Communication with Friends and Family:   . Frequency of Social Gatherings with Friends and Family:   . Attends Religious Services:   . Active Member of Clubs or Organizations:   . Attends Archivist Meetings:   Marland Kitchen Marital Status:      Family History: The patient's family history includes Bone cancer in her mother; Cancer in her maternal grandmother; Colon cancer (age of onset: 52) in her brother; Heart attack in her brother; Mental illness in her son.  ROS:   Please see the history of present illness.     All other systems reviewed and are negative.  EKGs/Labs/Other Studies Reviewed:    The following studies were reviewed today:   EKG:  EKG is not   ordered today.  The ekg ordered most recently demonstrates normal sinus rhythm, rate 76, no ST/T abnormalities  Recent Labs: 10/07/2019: BUN 23; Creatinine, Ser 1.06; Potassium 4.6; Sodium 141  Recent Lipid Panel    Component Value Date/Time   CHOL 278 (H) 10/07/2019 0952   TRIG 99 10/07/2019 0952   HDL 116 10/07/2019 0952   CHOLHDL 2.4 10/07/2019 0952   LDLCALC 146 (  H) 10/07/2019 0865    Physical Exam:    VS:  BP (!) 136/82   Pulse 87   Ht 5' 5.5" (1.664 m)   Wt 177 lb 12.8 oz (80.6 kg)   SpO2 95%   BMI 29.14 kg/m     Wt Readings from Last 3 Encounters:  12/23/19 177 lb 12.8 oz (80.6 kg)  09/24/19 175 lb 6.4 oz (79.6 kg)  08/25/19 174 lb (78.9 kg)     GEN:  in no acute distress HEENT: Normal NECK: No JVD; R carotid bruit can take forever 15 tentative swimming E 2650 m  ~30, and.  To [place] [phone] p.m. CKD with activity 1 who is waiting aortic effect on room LYMPHATICS: No lymphadenopathy CARDIAC: RRR, no murmurs, rubs, gallops RESPIRATORY:  Clear to auscultation without rales, wheezing or rhonchi  ABDOMEN: Soft, non-tender, non-distended MUSCULOSKELETAL:  No edema; No deformity  SKIN: Warm and dry NEUROLOGIC:  Alert and oriented x 3 PSYCHIATRIC:  Normal affect   ASSESSMENT:    1. Coronary artery disease involving native coronary artery of native heart without angina pectoris   2. Stenosis of carotid artery, unspecified laterality   3. Hyperlipidemia, unspecified hyperlipidemia type   4. Essential hypertension    PLAN:     CAD: Presented with atypical chest pain.  Coronary CTA on 10/23/2019 showed calcium score 411 (98th percentile), nonobstructive CAD with mixed plaque in proximal LAD causing 50 to 69% stenosis (CT FFR 0.84), calcified plaque in proximal circumflex causing 0 to 24% stenosis, mid next plaque in the mid circumflex causing 25 to 49% stenosis (CT FFR 0.82), mixed plaque in proximal and mid RCA causing 25 to 49% stenosis). -Continue  atorvastatin 40 mg daily  Hyperlipidemia: LDL 146 on 09/27/2019.  Started on atorvastatin 40 mg in June 2021, will repeat lipid panel next month  Hypertension: On olmesartan 40 mg daily.  Appears controlled  Carotid stenosis: Carotid duplex in 01/2019 showed right ICA less than 60% stenosis, left ICA 60 to 79%.  Asymptomatic.  Was seeing vascular surgery at [facility]. -Repeat carotid duplex in September 2021  RTC in 3 months   Medication Adjustments/Labs and Tests Ordered: Current medicines are reviewed at length with the patient today.  Concerns regarding medicines are outlined above.  Orders Placed This Encounter  Procedures  . Lipid panel  . VAS US CAROTID   No orders of the defined types were placed in this encounter.   Patient Instructions  Medication Instructions:  No Changes *If you need a refill on your cardiac medications before your next appointment, please call your pharmacy*   Lab Work: Lipid Panel the morning of Carotid Ultrasound If you have labs (blood work) drawn today and your tests are completely normal, you will receive your results only by: Marland Kitchen MyChart Message (if you have MyChart) OR . A paper copy in the mail If you have any lab test that is abnormal or we need to change your treatment, we will call you to review the results.   Testing/Procedures: Your physician has requested that you have a carotid duplex in September. This test is an ultrasound of the carotid arteries in your neck. It looks at blood flow through these arteries that supply the brain with blood. Allow one hour for this exam. There are no restrictions or special instructions.    Follow-Up: At [facility], you and your health needs are our priority.  As part of our continuing mission to provide you with exceptional heart  care, we have created designated Provider Care Teams.  These Care Teams include your primary Cardiologist (physician) and Advanced Practice Providers (APPs -  Physician  Assistants and Nurse Practitioners) who all work together to provide you with the care you need, when you need it.  We recommend signing up for the patient portal called "MyChart".  Sign up information is provided on this After Visit Summary.  MyChart is used to connect with patients for Virtual Visits (Telemedicine).  Patients are able to view lab/test results, encounter notes, upcoming appointments, etc.  Non-urgent messages can be sent to your provider as well.   To learn more about what you can do with MyChart, go to [email or web address].    Your next appointment:   3 month(s)  The format for your next appointment:   In Person  Provider:   Oswaldo Milian, MD   Other Instructions   Cholesterol Content in Foods Cholesterol is a waxy, fat-like substance that helps to carry fat in the blood. The body needs cholesterol in small amounts, but too much cholesterol can cause damage to the arteries and heart. Most people should eat less than 200 milligrams (mg) of cholesterol a day. Foods with cholesterol  Cholesterol is found in animal-based foods, such as meat, seafood, and dairy. Generally, low-fat dairy and lean meats have less cholesterol than full-fat dairy and fatty meats. The milligrams of cholesterol per serving (mg per serving) of common cholesterol-containing foods are listed below. Meat and other proteins  Egg -- one large whole egg has 186 mg.  Veal shank -- 4 oz has 141 mg.  Lean ground [place] (93% lean) -- 4 oz has 118 mg.  Fat-trimmed lamb loin -- 4 oz has 106 mg.  Lean ground beef (90% lean) -- 4 oz has 100 mg.  Lobster -- 3.5 oz has 90 mg.  Pork loin chops -- 4 oz has 86 mg.  Canned salmon -- 3.5 oz has 83 mg.  Fat-trimmed beef top loin -- 4 oz has 78 mg.  Frankfurter -- 1 frank (3.5 oz) has 77 mg.  Crab -- 3.5 oz has 71 mg.  Roasted chicken without skin, white meat -- 4 oz has 66 mg.  Light bologna -- 2 oz has 45 mg.  Deli-cut [place] -- 2 oz  has 31 mg.  Canned tuna -- 3.5 oz has 31 mg.  Berniece Salines -- 1 oz has 29 mg.  Oysters and mussels (raw) -- 3.5 oz has 25 mg.  Mackerel -- 1 oz has 22 mg.  Trout -- 1 oz has 20 mg.  Pork sausage -- 1 link (1 oz) has 17 mg.  Salmon -- 1 oz has 16 mg.  Tilapia -- 1 oz has 14 mg. Dairy  Soft-serve ice cream --  cup (4 oz) has 103 mg.  Whole-milk yogurt -- 1 cup (8 oz) has 29 mg.  Cheddar cheese -- 1 oz has 28 mg.  American cheese -- 1 oz has 28 mg.  Whole milk -- 1 cup (8 oz) has 23 mg.  2% milk -- 1 cup (8 oz) has 18 mg.  Cream cheese -- 1 tablespoon (Tbsp) has 15 mg.  Cottage cheese --  cup (4 oz) has 14 mg.  Low-fat (1%) milk -- 1 cup (8 oz) has 10 mg.  Sour cream -- 1 Tbsp has 8.5 mg.  Low-fat yogurt -- 1 cup (8 oz) has 8 mg.  Nonfat Greek yogurt -- 1 cup (8 oz) has 7 mg.  Half-and-half cream -- 1  Tbsp has 5 mg. Fats and oils  Cod liver oil -- 1 tablespoon (Tbsp) has 82 mg.  Butter -- 1 Tbsp has 15 mg.  Lard -- 1 Tbsp has 14 mg.  Bacon grease -- 1 Tbsp has 14 mg.  Mayonnaise -- 1 Tbsp has 5-10 mg.  Margarine -- 1 Tbsp has 3-10 mg. Exact amounts of cholesterol in these foods may vary depending on specific ingredients and brands. Foods without cholesterol Most plant-based foods do not have cholesterol unless you combine them with a food that has cholesterol. Foods without cholesterol include:  Grains and cereals.  Vegetables.  Fruits.  Vegetable oils, such as olive, canola, and sunflower oil.  Legumes, such as peas, beans, and lentils.  Nuts and seeds.  Egg whites. Summary  The body needs cholesterol in small amounts, but too much cholesterol can cause damage to the arteries and heart.  Most people should eat less than 200 milligrams (mg) of cholesterol a day. This information is not intended to replace advice given to you by your health care provider. Make sure you discuss any questions you have with your health care provider. Document  Revised: 04/27/2017 Document Reviewed: 01/09/2017 Elsevier Patient Education  2020 [place], Donato Heinz, MD  12/23/2019 10:59 PM    [facility]

## 2019-12-23 ENCOUNTER — Encounter: Payer: Self-pay | Admitting: Cardiology

## 2019-12-23 ENCOUNTER — Ambulatory Visit: Payer: Medicare HMO | Admitting: Cardiology

## 2019-12-23 ENCOUNTER — Other Ambulatory Visit: Payer: Self-pay

## 2019-12-23 VITALS — BP 136/82 | HR 87 | Ht 65.5 in | Wt 177.8 lb

## 2019-12-23 DIAGNOSIS — I2510 Atherosclerotic heart disease of native coronary artery without angina pectoris: Secondary | ICD-10-CM

## 2019-12-23 DIAGNOSIS — I10 Essential (primary) hypertension: Secondary | ICD-10-CM | POA: Diagnosis not present

## 2019-12-23 DIAGNOSIS — E785 Hyperlipidemia, unspecified: Secondary | ICD-10-CM | POA: Diagnosis not present

## 2019-12-23 DIAGNOSIS — I6529 Occlusion and stenosis of unspecified carotid artery: Secondary | ICD-10-CM | POA: Diagnosis not present

## 2019-12-23 NOTE — Patient Instructions (Addendum)
Medication Instructions:  No Changes *If you need a refill on your cardiac medications before your next appointment, please call your pharmacy*   Lab Work: Lipid Panel the morning of Carotid Ultrasound If you have labs (blood work) drawn today and your tests are completely normal, you will receive your results only by: Marland Kitchen MyChart Message (if you have MyChart) OR . A paper copy in the mail If you have any lab test that is abnormal or we need to change your treatment, we will call you to review the results.   Testing/Procedures: Your physician has requested that you have a carotid duplex in September. This test is an ultrasound of the carotid arteries in your neck. It looks at blood flow through these arteries that supply the brain with blood. Allow one hour for this exam. There are no restrictions or special instructions.    Follow-Up: At [facility], you and your health needs are our priority.  As part of our continuing mission to provide you with exceptional heart care, we have created designated Provider Care Teams.  These Care Teams include your primary Cardiologist (physician) and Advanced Practice Providers (APPs -  Physician Assistants and Nurse Practitioners) who all work together to provide you with the care you need, when you need it.  We recommend signing up for the patient portal called "MyChart".  Sign up information is provided on this After Visit Summary.  MyChart is used to connect with patients for Virtual Visits (Telemedicine).  Patients are able to view lab/test results, encounter notes, upcoming appointments, etc.  Non-urgent messages can be sent to your provider as well.   To learn more about what you can do with MyChart, go to [email or web address].    Your next appointment:   3 month(s)  The format for your next appointment:   In Person  Provider:   Oswaldo Milian, MD   Other Instructions   Cholesterol Content in Foods Cholesterol is a waxy,  fat-like substance that helps to carry fat in the blood. The body needs cholesterol in small amounts, but too much cholesterol can cause damage to the arteries and heart. Most people should eat less than 200 milligrams (mg) of cholesterol a day. Foods with cholesterol  Cholesterol is found in animal-based foods, such as meat, seafood, and dairy. Generally, low-fat dairy and lean meats have less cholesterol than full-fat dairy and fatty meats. The milligrams of cholesterol per serving (mg per serving) of common cholesterol-containing foods are listed below. Meat and other proteins  Egg -- one large whole egg has 186 mg.  Veal shank -- 4 oz has 141 mg.  Lean ground [place] (93% lean) -- 4 oz has 118 mg.  Fat-trimmed lamb loin -- 4 oz has 106 mg.  Lean ground beef (90% lean) -- 4 oz has 100 mg.  Lobster -- 3.5 oz has 90 mg.  Pork loin chops -- 4 oz has 86 mg.  Canned salmon -- 3.5 oz has 83 mg.  Fat-trimmed beef top loin -- 4 oz has 78 mg.  Frankfurter -- 1 frank (3.5 oz) has 77 mg.  Crab -- 3.5 oz has 71 mg.  Roasted chicken without skin, white meat -- 4 oz has 66 mg.  Light bologna -- 2 oz has 45 mg.  Deli-cut [place] -- 2 oz has 31 mg.  Canned tuna -- 3.5 oz has 31 mg.  Berniece Salines -- 1 oz has 29 mg.  Oysters and mussels (raw) -- 3.5 oz has 25 mg.  Mackerel --  1 oz has 22 mg.  Trout -- 1 oz has 20 mg.  Pork sausage -- 1 link (1 oz) has 17 mg.  Salmon -- 1 oz has 16 mg.  Tilapia -- 1 oz has 14 mg. Dairy  Soft-serve ice cream --  cup (4 oz) has 103 mg.  Whole-milk yogurt -- 1 cup (8 oz) has 29 mg.  Cheddar cheese -- 1 oz has 28 mg.  American cheese -- 1 oz has 28 mg.  Whole milk -- 1 cup (8 oz) has 23 mg.  2% milk -- 1 cup (8 oz) has 18 mg.  Cream cheese -- 1 tablespoon (Tbsp) has 15 mg.  Cottage cheese --  cup (4 oz) has 14 mg.  Low-fat (1%) milk -- 1 cup (8 oz) has 10 mg.  Sour cream -- 1 Tbsp has 8.5 mg.  Low-fat yogurt -- 1 cup (8 oz) has 8  mg.  Nonfat Greek yogurt -- 1 cup (8 oz) has 7 mg.  Half-and-half cream -- 1 Tbsp has 5 mg. Fats and oils  Cod liver oil -- 1 tablespoon (Tbsp) has 82 mg.  Butter -- 1 Tbsp has 15 mg.  Lard -- 1 Tbsp has 14 mg.  Bacon grease -- 1 Tbsp has 14 mg.  Mayonnaise -- 1 Tbsp has 5-10 mg.  Margarine -- 1 Tbsp has 3-10 mg. Exact amounts of cholesterol in these foods may vary depending on specific ingredients and brands. Foods without cholesterol Most plant-based foods do not have cholesterol unless you combine them with a food that has cholesterol. Foods without cholesterol include:  Grains and cereals.  Vegetables.  Fruits.  Vegetable oils, such as olive, canola, and sunflower oil.  Legumes, such as peas, beans, and lentils.  Nuts and seeds.  Egg whites. Summary  The body needs cholesterol in small amounts, but too much cholesterol can cause damage to the arteries and heart.  Most people should eat less than 200 milligrams (mg) of cholesterol a day. This information is not intended to replace advice given to you by your health care provider. Make sure you discuss any questions you have with your health care provider. Document Revised: 04/27/2017 Document Reviewed: 01/09/2017 Elsevier Patient Education  [place].

## 2020-01-13 DIAGNOSIS — M47814 Spondylosis without myelopathy or radiculopathy, thoracic region: Secondary | ICD-10-CM | POA: Diagnosis not present

## 2020-01-13 DIAGNOSIS — M170 Bilateral primary osteoarthritis of knee: Secondary | ICD-10-CM | POA: Diagnosis not present

## 2020-01-13 DIAGNOSIS — M5136 Other intervertebral disc degeneration, lumbar region: Secondary | ICD-10-CM | POA: Diagnosis not present

## 2020-01-13 DIAGNOSIS — G894 Chronic pain syndrome: Secondary | ICD-10-CM | POA: Diagnosis not present

## 2020-02-03 ENCOUNTER — Ambulatory Visit (HOSPITAL_COMMUNITY)
Admission: RE | Admit: 2020-02-03 | Payer: Medicare HMO | Source: Ambulatory Visit | Attending: Cardiology | Admitting: Cardiology

## 2020-02-10 DIAGNOSIS — G4731 Primary central sleep apnea: Secondary | ICD-10-CM | POA: Diagnosis not present

## 2020-02-12 ENCOUNTER — Ambulatory Visit (HOSPITAL_COMMUNITY)
Admission: RE | Admit: 2020-02-12 | Discharge: 2020-02-12 | Disposition: A | Discharge status: Home / Self Care | Payer: Medicare HMO | Source: Ambulatory Visit | Attending: Internal Medicine | Admitting: Internal Medicine

## 2020-02-12 ENCOUNTER — Other Ambulatory Visit: Payer: Self-pay

## 2020-02-12 ENCOUNTER — Other Ambulatory Visit: Payer: Self-pay | Admitting: Cardiology

## 2020-02-12 DIAGNOSIS — I6529 Occlusion and stenosis of unspecified carotid artery: Secondary | ICD-10-CM | POA: Diagnosis not present

## 2020-02-13 DIAGNOSIS — E785 Hyperlipidemia, unspecified: Secondary | ICD-10-CM | POA: Diagnosis not present

## 2020-02-14 LAB — LIPID PANEL
Chol/HDL Ratio: 3.1 ratio (ref 0.0–4.4)
Cholesterol, Total: 227 mg/dL — ABNORMAL HIGH (ref 100–199)
HDL: 73 mg/dL (ref >39)
LDL Chol Calc (NIH): 127 mg/dL — ABNORMAL HIGH (ref 0–99)
Triglycerides: 157 mg/dL — ABNORMAL HIGH (ref 0–149)
VLDL Cholesterol Cal: 27 mg/dL (ref 5–40)

## 2020-02-17 DIAGNOSIS — F411 Generalized anxiety disorder: Secondary | ICD-10-CM | POA: Diagnosis not present

## 2020-02-17 DIAGNOSIS — G8921 Chronic pain due to trauma: Secondary | ICD-10-CM | POA: Diagnosis not present

## 2020-02-17 DIAGNOSIS — R69 Illness, unspecified: Secondary | ICD-10-CM | POA: Diagnosis not present

## 2020-02-17 DIAGNOSIS — F900 Attention-deficit hyperactivity disorder, predominantly inattentive type: Secondary | ICD-10-CM | POA: Diagnosis not present

## 2020-02-18 DIAGNOSIS — M961 Postlaminectomy syndrome, not elsewhere classified: Secondary | ICD-10-CM | POA: Diagnosis not present

## 2020-02-18 DIAGNOSIS — M47814 Spondylosis without myelopathy or radiculopathy, thoracic region: Secondary | ICD-10-CM | POA: Diagnosis not present

## 2020-02-18 DIAGNOSIS — Z79891 Long term (current) use of opiate analgesic: Secondary | ICD-10-CM | POA: Diagnosis not present

## 2020-02-18 DIAGNOSIS — M5136 Other intervertebral disc degeneration, lumbar region: Secondary | ICD-10-CM | POA: Diagnosis not present

## 2020-02-18 DIAGNOSIS — G894 Chronic pain syndrome: Secondary | ICD-10-CM | POA: Diagnosis not present

## 2020-02-18 DIAGNOSIS — Z79899 Other long term (current) drug therapy: Secondary | ICD-10-CM | POA: Diagnosis not present

## 2020-02-18 DIAGNOSIS — M47812 Spondylosis without myelopathy or radiculopathy, cervical region: Secondary | ICD-10-CM | POA: Diagnosis not present

## 2020-02-26 DIAGNOSIS — G43719 Chronic migraine without aura, intractable, without status migrainosus: Secondary | ICD-10-CM | POA: Diagnosis not present

## 2020-02-26 DIAGNOSIS — I6523 Occlusion and stenosis of bilateral carotid arteries: Secondary | ICD-10-CM | POA: Diagnosis not present

## 2020-02-26 DIAGNOSIS — M5417 Radiculopathy, lumbosacral region: Secondary | ICD-10-CM | POA: Diagnosis not present

## 2020-02-26 DIAGNOSIS — G518 Other disorders of facial nerve: Secondary | ICD-10-CM | POA: Diagnosis not present

## 2020-03-10 ENCOUNTER — Other Ambulatory Visit: Payer: Self-pay | Admitting: Family Medicine

## 2020-03-10 DIAGNOSIS — I10 Essential (primary) hypertension: Secondary | ICD-10-CM

## 2020-03-21 NOTE — Progress Notes (Signed)
Cardiology Office Note:    Date:  03/24/2020   ID:  Sarah Chapman, DOB [birth year], MRN [id number]  PCP:  Claretta Fraise, MD  Cardiologist:  Donato Heinz, MD  Electrophysiologist:  None   Referring MD: Claretta Fraise, MD   Chief Complaint  Patient presents with   Coronary Artery Disease   History of Present Illness:    Sarah Chapman is a 59 y.o. female with a hx of hypertension, COPD, hyperlipidemia, OSA who presents for follow-up.  She was referred by Dr. Livia Snellen for evaluation of dyspnea, initially seen on 09/24/2019.  States that she feels short of breath with minimal exertion.  Reports that walking up stairs or even across the room causes her symptoms.  Also reports that she has been having chest pain daily.  Describes as left-sided chest tightness.  Can last for hours.  Can occur at rest.  She has not been exercising.  Reports has gained 10 pounds in the last year.  She quit smoking 3 years ago, smoked 1 pack/day x 22 years.  No history of heart disease in her immediate family.  Coronary CTA on 10/23/2019 showed calcium score 411 (98th percentile), nonobstructive CAD with mixed plaque in proximal LAD causing 50 to 69% stenosis (CT FFR 0.84), calcified plaque in proximal circumflex causing 0 to 24% stenosis, mid next plaque in the mid circumflex causing 25 to 49% stenosis (CT FFR 0.82), mixed plaque in proximal and mid RCA causing 25 to 49% stenosis).  Since last clinic visit, she reports she has been doing well.  Denies any chest pain.  Continues to have intermittent dyspnea.  She has not been exercising.  She denies any syncope, lightheadedness, or palpitations.  Denies any lower extremity edema.  Does report she has been having pain in her legs particularly when she walks.   Past Medical History:  Diagnosis Date   Allergic rhinitis    Back pain    Chronic headache    COPD (chronic obstructive pulmonary disease) (HCC)    Emphysema of lung (HCC)    Hyperlipidemia     Hypertension    Neck pain    Sleep apnea    Vaginal Pap smear, abnormal     Past Surgical History:  Procedure Laterality Date   CESAREAN SECTION     GYNECOLOGIC CRYOSURGERY     NECK SURGERY     TONSILLECTOMY      Current Medications: Current Meds  Medication Sig   ADVAIR DISKUS 250-50 MCG/DOSE AEPB 1 PUFF TWICE DAILY. RINSE MOUTH WITH WATER AFTER EACH USE.   albuterol (VENTOLIN HFA) 108 (90 Base) MCG/ACT inhaler Inhale 2 puffs into the lungs every 6 (six) hours as needed for wheezing or shortness of breath.   ALPRAZolam (XANAX) 0.5 MG tablet Take 1 tablet by mouth as needed.    aspirin 81 MG EC tablet Take 1 tablet by mouth daily.   BOTOX 200 units SOLR TO BE GIVEN BY PROVIDER IN OFFICE EVERY 90 DAYS   buPROPion (WELLBUTRIN XL) 300 MG 24 hr tablet Take 1 tablet by mouth daily.   celecoxib (CELEBREX) 100 MG capsule Take 100 mg by mouth 2 (two) times daily.    dexlansoprazole (DEXILANT) 60 MG capsule TAKE (1) CAPSULE DAILY   DULoxetine (CYMBALTA) 30 MG capsule Take 30 mg by mouth 2 (two) times daily.   loratadine (CLARITIN) 10 MG tablet Take 10 mg by mouth daily as needed for allergies.   nortriptyline (PAMELOR) 10 MG capsule Take 10 mg by  mouth at bedtime.    NURTEC 75 MG TBDP Take by mouth.   NUVIGIL 250 MG tablet Take 250 mg by mouth daily.   olmesartan (BENICAR) 40 MG tablet Take 1 tablet (40 mg total) by mouth daily. (Needs to be seen before next refill)   pyridostigmine (MESTINON) 60 MG tablet Take 30-60 mg by mouth 2 (two) times daily.   Sennosides (PERDIEM OVERNIGHT RELIEF PO) Take by mouth at bedtime.   tiZANidine (ZANAFLEX) 4 MG tablet Take 1 tablet by mouth at bedtime.   [DISCONTINUED] atorvastatin (LIPITOR) 40 MG tablet Take 1 tablet (40 mg total) by mouth daily.     Allergies:   Clindamycin/lincomycin and Penicillins   Social History   Socioeconomic History   Marital status: Divorced    Spouse name: Not on file   Number of children:  Not on file   Years of education: Not on file   Highest education level: Not on file  Occupational History   Not on file  Tobacco Use   Smoking status: Former Smoker    Packs/day: 1.00    Years: 33.00    Pack years: 33.00    Types: Cigarettes    Quit date: 07/25/2016    Years since quitting: 3.6   Smokeless tobacco: Never Used  Vaping Use   Vaping Use: Never used  Substance and Sexual Activity   Alcohol use: No   Drug use: No   Sexual activity: Not Currently    Birth control/protection: Post-menopausal  Other Topics Concern   Not on file  Social History Narrative   Not on file   Social Determinants of Health   Financial Resource Strain:    Difficulty of Paying Living Expenses: Not on file  Food Insecurity:    Worried About Charity fundraiser in the Last Year: Not on file   [employer] of Food in the Last Year: Not on file  Transportation Needs:    Lack of Transportation (Medical): Not on file   Lack of Transportation (Non-Medical): Not on file  Physical Activity:    Days of Exercise per Week: Not on file   Minutes of Exercise per Session: Not on file  Stress:    Feeling of Stress : Not on file  Social Connections:    Frequency of Communication with Friends and Family: Not on file   Frequency of Social Gatherings with Friends and Family: Not on file   Attends Religious Services: Not on file   Active Member of Clubs or Organizations: Not on file   Attends Archivist Meetings: Not on file   Marital Status: Not on file     Family History: The patient's family history includes Bone cancer in her mother; Cancer in her maternal grandmother; Colon cancer (age of onset: 76) in her brother; Heart attack in her brother; Mental illness in her son.  ROS:   Please see the history of present illness.     All other systems reviewed and are negative.  EKGs/Labs/Other Studies Reviewed:    The following studies were reviewed today:   EKG:  EKG  is ordered today.  The ekg ordered demonstrates normal sinus rhythm, rate 75, no ST/T abnormalities  Recent Labs: 10/07/2019: BUN 23; Creatinine, Ser 1.06; Potassium 4.6; Sodium 141  Recent Lipid Panel    Component Value Date/Time   CHOL 227 (H) 02/13/2020 1004   TRIG 157 (H) 02/13/2020 1004   HDL 73 02/13/2020 1004   CHOLHDL 3.1 02/13/2020 1004   LDLCALC  127 (H) 02/13/2020 1004    Physical Exam:    VS:  BP 126/84 (BP Location: Left Arm, Patient Position: Sitting)    Pulse 75    Ht 5\' 5"  (1.651 m)    Wt 176 lb 3.2 oz (79.9 kg)    SpO2 98%    BMI 29.32 kg/m     Wt Readings from Last 3 Encounters:  03/24/20 176 lb 3.2 oz (79.9 kg)  12/23/19 177 lb 12.8 oz (80.6 kg)  09/24/19 175 lb 6.4 oz (79.6 kg)     GEN:  in no acute distress HEENT: Normal NECK: No JVD CARDIAC: RRR, no murmurs, rubs, gallops RESPIRATORY:  Clear to auscultation without rales, wheezing or rhonchi  ABDOMEN: Soft, non-tender, non-distended MUSCULOSKELETAL:  No edema; No deformity  SKIN: Warm and dry NEUROLOGIC:  Alert and oriented x 3 PSYCHIATRIC:  Normal affect   ASSESSMENT:    1. Coronary artery disease involving native coronary artery of native heart without angina pectoris   2. Hyperlipidemia, unspecified hyperlipidemia type   3. Bilateral leg pain   4. Stenosis of carotid artery, unspecified laterality   5. Essential hypertension    PLAN:     CAD: Presented with atypical chest pain.  Coronary CTA on 10/23/2019 showed calcium score 411 (98th percentile), nonobstructive CAD with mixed plaque in proximal LAD causing 50 to 69% stenosis (CT FFR 0.84), calcified plaque in proximal circumflex causing 0 to 24% stenosis, mid next plaque in the mid circumflex causing 25 to 49% stenosis (CT FFR 0.82), mixed plaque in proximal and mid RCA causing 25 to 49% stenosis). -Continue atorvastatin 80 mg daily  Hyperlipidemia: LDL 146 on 09/27/2019.  Started on atorvastatin 40 mg in June 2021, LDL 127 on 02/13/20.   Increased atorvastatin to 80 mg at that time.  We will continue atorvastatin 80 mg daily and recheck lipid panel in 2 weeks.  Goal LDL less than 70.  Hypertension: On olmesartan 40 mg daily.  Appears controlled  Carotid stenosis: Carotid duplex in 01/2019 showed right ICA less than 60% stenosis, left ICA 60 to 79%.  Asymptomatic.  Was seeing [facility] at [facility].  Repeat duplex 02/12/2020 shows 60 to 79% stenosis in bilateral ICA.  Continue aspirin, statin.  Leg pain: Will check ABIs  RTC in 6 months   Medication Adjustments/Labs and Tests Ordered: Current medicines are reviewed at length with the patient today.  Concerns regarding medicines are outlined above.  Orders Placed This Encounter  Procedures   Lipid panel   EKG 12-Lead   VAS [place] ABI WITH/WO TBI   VAS [place] LOWER EXTREMITY ARTERIAL DUPLEX   Meds ordered this encounter  Medications   atorvastatin (LIPITOR) 80 MG tablet    Sig: Take 1 tablet (80 mg total) by mouth daily.    Dispense:  90 tablet    Refill:  3    Patient Instructions  Medication Instructions:  Your physician recommends that you continue on your current medications as directed. Please refer to the Current Medication list given to you today.  *If you need a refill on your cardiac medications before your next appointment, please call your pharmacy*   Lab Work: Please return for FASTING labs in 2 months (Lipid)  Our in office lab hours are Monday-Friday 8:00-4:00, closed for lunch 12:45-1:45 pm.  No appointment needed.  Testing/Procedures: Your physician has requested that you have an ankle brachial index (ABI). During this test an ultrasound and blood pressure cuff are used to evaluate the arteries that  supply the arms and legs with blood. Allow thirty minutes for this exam. There are no restrictions or special instructions.  Follow-Up: At [facility], you and your health needs are our priority.  As part of our continuing mission to provide you  with exceptional heart care, we have created designated Provider Care Teams.  These Care Teams include your primary Cardiologist (physician) and Advanced Practice Providers (APPs -  Physician Assistants and Nurse Practitioners) who all work together to provide you with the care you need, when you need it.  We recommend signing up for the patient portal called "MyChart".  Sign up information is provided on this After Visit Summary.  MyChart is used to connect with patients for Virtual Visits (Telemedicine).  Patients are able to view lab/test results, encounter notes, upcoming appointments, etc.  Non-urgent messages can be sent to your provider as well.   To learn more about what you can do with MyChart, go to [email or web address].    Your next appointment:   6 month(s)  The format for your next appointment:   In Person  Provider:   Oswaldo Milian, MD       Signed, Donato Heinz, MD  03/24/2020 9:50 PM    [place]

## 2020-03-24 ENCOUNTER — Ambulatory Visit: Payer: Medicare HMO | Admitting: Cardiology

## 2020-03-24 ENCOUNTER — Other Ambulatory Visit: Payer: Self-pay

## 2020-03-24 ENCOUNTER — Encounter: Payer: Self-pay | Admitting: Cardiology

## 2020-03-24 VITALS — BP 126/84 | HR 75 | Ht 65.0 in | Wt 176.2 lb

## 2020-03-24 DIAGNOSIS — I10 Essential (primary) hypertension: Secondary | ICD-10-CM | POA: Diagnosis not present

## 2020-03-24 DIAGNOSIS — M79605 Pain in left leg: Secondary | ICD-10-CM | POA: Diagnosis not present

## 2020-03-24 DIAGNOSIS — M79604 Pain in right leg: Secondary | ICD-10-CM

## 2020-03-24 DIAGNOSIS — I6529 Occlusion and stenosis of unspecified carotid artery: Secondary | ICD-10-CM | POA: Diagnosis not present

## 2020-03-24 DIAGNOSIS — I2510 Atherosclerotic heart disease of native coronary artery without angina pectoris: Secondary | ICD-10-CM

## 2020-03-24 DIAGNOSIS — E785 Hyperlipidemia, unspecified: Secondary | ICD-10-CM | POA: Diagnosis not present

## 2020-03-24 MED ORDER — ATORVASTATIN CALCIUM 80 MG PO TABS
80.0000 mg | ORAL_TABLET | Freq: Every day | ORAL | 3 refills | Status: DC
Start: 2020-03-24 — End: 2021-03-11

## 2020-03-24 NOTE — Patient Instructions (Signed)
Medication Instructions:  Your physician recommends that you continue on your current medications as directed. Please refer to the Current Medication list given to you today.  *If you need a refill on your cardiac medications before your next appointment, please call your pharmacy*   Lab Work: Please return for FASTING labs in 2 months (Lipid)  Our in office lab hours are Monday-Friday 8:00-4:00, closed for lunch 12:45-1:45 pm.  No appointment needed.  Testing/Procedures: Your physician has requested that you have an ankle brachial index (ABI). During this test an ultrasound and blood pressure cuff are used to evaluate the arteries that supply the arms and legs with blood. Allow thirty minutes for this exam. There are no restrictions or special instructions.  Follow-Up: At [facility], you and your health needs are our priority.  As part of our continuing mission to provide you with exceptional heart care, we have created designated Provider Care Teams.  These Care Teams include your primary Cardiologist (physician) and Advanced Practice Providers (APPs -  Physician Assistants and Nurse Practitioners) who all work together to provide you with the care you need, when you need it.  We recommend signing up for the patient portal called "MyChart".  Sign up information is provided on this After Visit Summary.  MyChart is used to connect with patients for Virtual Visits (Telemedicine).  Patients are able to view lab/test results, encounter notes, upcoming appointments, etc.  Non-urgent messages can be sent to your provider as well.   To learn more about what you can do with MyChart, go to [email or web address].    Your next appointment:   6 month(s)  The format for your next appointment:   In Person  Provider:   Oswaldo Milian, MD

## 2020-03-29 DIAGNOSIS — M25569 Pain in unspecified knee: Secondary | ICD-10-CM | POA: Diagnosis not present

## 2020-03-29 DIAGNOSIS — Z79891 Long term (current) use of opiate analgesic: Secondary | ICD-10-CM | POA: Diagnosis not present

## 2020-03-29 DIAGNOSIS — G894 Chronic pain syndrome: Secondary | ICD-10-CM | POA: Diagnosis not present

## 2020-03-29 DIAGNOSIS — Z79899 Other long term (current) drug therapy: Secondary | ICD-10-CM | POA: Diagnosis not present

## 2020-03-29 DIAGNOSIS — M47814 Spondylosis without myelopathy or radiculopathy, thoracic region: Secondary | ICD-10-CM | POA: Diagnosis not present

## 2020-03-31 ENCOUNTER — Other Ambulatory Visit: Payer: Self-pay

## 2020-03-31 ENCOUNTER — Ambulatory Visit (HOSPITAL_COMMUNITY)
Admission: RE | Admit: 2020-03-31 | Discharge: 2020-03-31 | Disposition: A | Discharge status: Home / Self Care | Payer: Medicare HMO | Source: Ambulatory Visit | Attending: Cardiology | Admitting: Cardiology

## 2020-03-31 DIAGNOSIS — M79604 Pain in right leg: Secondary | ICD-10-CM | POA: Diagnosis not present

## 2020-03-31 DIAGNOSIS — M79605 Pain in left leg: Secondary | ICD-10-CM

## 2020-04-05 ENCOUNTER — Encounter: Payer: Self-pay | Admitting: *Deleted

## 2020-04-21 ENCOUNTER — Other Ambulatory Visit: Payer: Self-pay | Admitting: Gastroenterology

## 2020-04-28 DIAGNOSIS — Z79891 Long term (current) use of opiate analgesic: Secondary | ICD-10-CM | POA: Diagnosis not present

## 2020-04-28 DIAGNOSIS — M47814 Spondylosis without myelopathy or radiculopathy, thoracic region: Secondary | ICD-10-CM | POA: Diagnosis not present

## 2020-04-28 DIAGNOSIS — G894 Chronic pain syndrome: Secondary | ICD-10-CM | POA: Diagnosis not present

## 2020-04-28 DIAGNOSIS — M5136 Other intervertebral disc degeneration, lumbar region: Secondary | ICD-10-CM | POA: Diagnosis not present

## 2020-04-28 DIAGNOSIS — M961 Postlaminectomy syndrome, not elsewhere classified: Secondary | ICD-10-CM | POA: Diagnosis not present

## 2020-04-28 DIAGNOSIS — Z79899 Other long term (current) drug therapy: Secondary | ICD-10-CM | POA: Diagnosis not present

## 2020-04-28 DIAGNOSIS — M47812 Spondylosis without myelopathy or radiculopathy, cervical region: Secondary | ICD-10-CM | POA: Diagnosis not present

## 2020-05-07 DIAGNOSIS — G8921 Chronic pain due to trauma: Secondary | ICD-10-CM | POA: Diagnosis not present

## 2020-05-07 DIAGNOSIS — F411 Generalized anxiety disorder: Secondary | ICD-10-CM | POA: Diagnosis not present

## 2020-05-07 DIAGNOSIS — F900 Attention-deficit hyperactivity disorder, predominantly inattentive type: Secondary | ICD-10-CM | POA: Diagnosis not present

## 2020-05-07 DIAGNOSIS — R69 Illness, unspecified: Secondary | ICD-10-CM | POA: Diagnosis not present

## 2020-05-26 DIAGNOSIS — M47814 Spondylosis without myelopathy or radiculopathy, thoracic region: Secondary | ICD-10-CM | POA: Diagnosis not present

## 2020-05-26 DIAGNOSIS — M25569 Pain in unspecified knee: Secondary | ICD-10-CM | POA: Diagnosis not present

## 2020-05-26 DIAGNOSIS — M47812 Spondylosis without myelopathy or radiculopathy, cervical region: Secondary | ICD-10-CM | POA: Diagnosis not present

## 2020-05-26 DIAGNOSIS — G894 Chronic pain syndrome: Secondary | ICD-10-CM | POA: Diagnosis not present

## 2020-05-26 DIAGNOSIS — M5136 Other intervertebral disc degeneration, lumbar region: Secondary | ICD-10-CM | POA: Diagnosis not present

## 2020-06-01 DIAGNOSIS — M5412 Radiculopathy, cervical region: Secondary | ICD-10-CM | POA: Diagnosis not present

## 2020-06-01 DIAGNOSIS — G43719 Chronic migraine without aura, intractable, without status migrainosus: Secondary | ICD-10-CM | POA: Diagnosis not present

## 2020-06-01 DIAGNOSIS — G518 Other disorders of facial nerve: Secondary | ICD-10-CM | POA: Diagnosis not present

## 2020-06-01 DIAGNOSIS — I6523 Occlusion and stenosis of bilateral carotid arteries: Secondary | ICD-10-CM | POA: Diagnosis not present

## 2020-06-03 DIAGNOSIS — H02834 Dermatochalasis of left upper eyelid: Secondary | ICD-10-CM | POA: Diagnosis not present

## 2020-06-03 DIAGNOSIS — H40003 Preglaucoma, unspecified, bilateral: Secondary | ICD-10-CM | POA: Diagnosis not present

## 2020-06-03 DIAGNOSIS — H02831 Dermatochalasis of right upper eyelid: Secondary | ICD-10-CM | POA: Diagnosis not present

## 2020-06-03 DIAGNOSIS — H43813 Vitreous degeneration, bilateral: Secondary | ICD-10-CM | POA: Diagnosis not present

## 2020-06-03 DIAGNOSIS — H527 Unspecified disorder of refraction: Secondary | ICD-10-CM | POA: Diagnosis not present

## 2020-06-03 DIAGNOSIS — H25813 Combined forms of age-related cataract, bilateral: Secondary | ICD-10-CM | POA: Diagnosis not present

## 2020-06-03 DIAGNOSIS — H0520 Unspecified exophthalmos: Secondary | ICD-10-CM | POA: Diagnosis not present

## 2020-06-15 ENCOUNTER — Other Ambulatory Visit: Payer: Self-pay | Admitting: Family Medicine

## 2020-06-15 DIAGNOSIS — I10 Essential (primary) hypertension: Secondary | ICD-10-CM

## 2020-06-16 ENCOUNTER — Other Ambulatory Visit: Payer: Self-pay | Admitting: Family Medicine

## 2020-06-16 ENCOUNTER — Telehealth: Payer: Self-pay

## 2020-06-16 DIAGNOSIS — I10 Essential (primary) hypertension: Secondary | ICD-10-CM

## 2020-06-16 MED ORDER — OLMESARTAN MEDOXOMIL 40 MG PO TABS: 40.0000 mg | ORAL_TABLET | Freq: Every day | ORAL | 0 refills | Status: DC

## 2020-06-16 NOTE — Telephone Encounter (Signed)
Last OV for Dx was 01/08/2019 Next OV 07/06/20, pt out of medication, needs 30d supply till OV

## 2020-06-16 NOTE — Telephone Encounter (Signed)
Left message advising rx sent in to get her to appt and to call back with any questions or concerns.

## 2020-06-16 NOTE — Telephone Encounter (Signed)
Please let the patient know that I sent their prescription to their pharmacy. Thanks, WS 

## 2020-06-16 NOTE — Telephone Encounter (Signed)
Stacks. NTBS 30 days given 03/10/20

## 2020-06-16 NOTE — Telephone Encounter (Signed)
Pt hasn't been seen for HTN since 12/2018. Ok to give 30 day supply? To get her to her scheduled appt time

## 2020-06-22 ENCOUNTER — Encounter: Payer: Self-pay | Admitting: Pulmonary Disease

## 2020-06-22 ENCOUNTER — Other Ambulatory Visit: Payer: Self-pay

## 2020-06-22 ENCOUNTER — Ambulatory Visit (INDEPENDENT_AMBULATORY_CARE_PROVIDER_SITE_OTHER): Payer: Medicare HMO | Admitting: Pulmonary Disease

## 2020-06-22 VITALS — BP 128/80 | HR 91 | Temp 97.1°F | Ht 65.0 in | Wt 176.0 lb

## 2020-06-22 DIAGNOSIS — J432 Centrilobular emphysema: Secondary | ICD-10-CM | POA: Diagnosis not present

## 2020-06-22 DIAGNOSIS — G4733 Obstructive sleep apnea (adult) (pediatric): Secondary | ICD-10-CM | POA: Diagnosis not present

## 2020-06-22 DIAGNOSIS — R0600 Dyspnea, unspecified: Secondary | ICD-10-CM | POA: Diagnosis not present

## 2020-06-22 LAB — CBC WITH DIFFERENTIAL/PLATELET
Basophils Absolute: 0.1 10*3/uL (ref 0.0–0.1)
Basophils Relative: 1.1 % (ref 0.0–3.0)
Eosinophils Absolute: 0.1 10*3/uL (ref 0.0–0.7)
Eosinophils Relative: 1.7 % (ref 0.0–5.0)
HCT: 38.5 % (ref 36.0–46.0)
Hemoglobin: 12.8 g/dL (ref 12.0–15.0)
Lymphocytes Relative: 26.9 % (ref 12.0–46.0)
Lymphs Abs: 1.7 10*3/uL (ref 0.7–4.0)
MCHC: 33.1 g/dL (ref 30.0–36.0)
MCV: 93.5 fl (ref 78.0–100.0)
Monocytes Absolute: 0.6 10*3/uL (ref 0.1–1.0)
Monocytes Relative: 9.6 % (ref 3.0–12.0)
Neutro Abs: 3.9 10*3/uL (ref 1.4–7.7)
Neutrophils Relative %: 60.7 % (ref 43.0–77.0)
Platelets: 341 10*3/uL (ref 150.0–400.0)
RBC: 4.12 Mil/uL (ref 3.87–5.11)
RDW: 14.5 % (ref 11.5–15.5)
WBC: 6.4 10*3/uL (ref 4.0–10.5)

## 2020-06-22 MED ORDER — TRELEGY ELLIPTA 200-62.5-25 MCG/INH IN AEPB: 1.0000 | INHALATION_SPRAY | Freq: Every day | RESPIRATORY_TRACT | 5 refills | Status: DC

## 2020-06-22 NOTE — Patient Instructions (Signed)
We will check labs today including CBC differential, IgE Stop the advair and start Trelegy 200 Continue to work on weight loss with diet and exercise  Follow-up in 3 months.

## 2020-06-22 NOTE — Progress Notes (Signed)
ARWEN HASELEY    [id number]    04-14-1961  Primary Care Physician:Stacks, Cletus Gash, MD  Referring Physician: Claretta Fraise, MD [place],  [place] [id number]  Chief complaint:  Follow up for COPD GOLD D (CAT score 13, several exacerbations over past year)  HPI: Mrs. Mariea Clonts is a 60 year old with history of COPD. She has been diagnosed in 2016 with PFTs that showed mild to moderate obstruction. She has been maintained on Advair but stopped taking this 1 month ago because of thrush.. She reports daily symptoms of dyspnea on exertion, cough with sputum production. She also saw al allergist in March 2018, diagnosed with allergies, asthma and was given astelin, fluticasone nasal sprays  She has about 25-pack-year smoking history and quit 1 month ago using Chantix but still has the urge to smoke. 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 She was diagnosed with a oibstructive sleep apnea in 2016 and is maintained on CPAP. She follows up with the sleep doctor at [facility] cannot remember the name]  Interim History: She has taken herself off all inhalers since she feels it's irritating her throat. She has not noticed any difference in her breathing off inhalers. She remains cigarette free since January of this year.  Outpatient Encounter Medications as of 06/22/2020  Medication Sig  . ADVAIR DISKUS 250-50 MCG/DOSE AEPB 1 PUFF TWICE DAILY. RINSE MOUTH WITH WATER AFTER EACH USE.  Marland Kitchen albuterol (VENTOLIN HFA) 108 (90 Base) MCG/ACT inhaler Inhale 2 puffs into the lungs every 6 (six) hours as needed for wheezing or shortness of breath.  . ALPRAZolam (XANAX) 0.5 MG tablet Take 1 tablet by mouth as needed.   Marland Kitchen aspirin 81 MG EC tablet Take 1 tablet by mouth daily.  Marland Kitchen atorvastatin (LIPITOR) 80 MG tablet Take 1 tablet (80 mg total) by mouth daily.  Marland Kitchen BOTOX 200 units SOLR TO BE GIVEN BY PROVIDER IN OFFICE EVERY 90 DAYS  . buPROPion  (WELLBUTRIN XL) 300 MG 24 hr tablet Take 1 tablet by mouth daily.  . celecoxib (CELEBREX) 100 MG capsule Take 100 mg by mouth 2 (two) times daily.   Marland Kitchen DEXILANT 60 MG capsule TAKE (1) CAPSULE DAILY  . loratadine (CLARITIN) 10 MG tablet Take 10 mg by mouth daily as needed for allergies.  Marland Kitchen nortriptyline (PAMELOR) 10 MG capsule Take 10 mg by mouth at bedtime.   . NURTEC 75 MG TBDP Take by mouth.  Marland Kitchen NUVIGIL 250 MG tablet Take 250 mg by mouth daily.  Marland Kitchen olmesartan (BENICAR) 40 MG tablet Take 1 tablet (40 mg total) by mouth daily.  Marland Kitchen pyridostigmine (MESTINON) 60 MG tablet Take 30-60 mg by mouth 2 (two) times daily.  . Sennosides (PERDIEM OVERNIGHT RELIEF PO) Take by mouth at bedtime.  Marland Kitchen tiZANidine (ZANAFLEX) 4 MG tablet Take 1 tablet by mouth at bedtime.  . [DISCONTINUED] DULoxetine (CYMBALTA) 30 MG capsule Take 30 mg by mouth 2 (two) times daily.   No facility-administered encounter medications on file as of 06/22/2020.    Allergies as of 06/22/2020 - Review Complete 06/22/2020  Allergen Reaction Noted  . Clindamycin/lincomycin Rash 11/27/2012  . Penicillins  04/03/2014    Past Medical History:  Diagnosis Date  . Allergic rhinitis   . Back pain   . Chronic headache   . COPD (chronic obstructive pulmonary disease) ([place])   . Emphysema of lung ([place])   .  Hyperlipidemia   . Hypertension   . Neck pain   . Sleep apnea   . Vaginal Pap smear, abnormal     Past Surgical History:  Procedure Laterality Date  . CESAREAN SECTION    . GYNECOLOGIC CRYOSURGERY    . NECK SURGERY    . TONSILLECTOMY      Family History  Problem Relation Age of Onset  . Bone cancer Mother   . Colon cancer Brother 32  . Heart attack Brother   . Cancer Maternal Grandmother   . Mental illness Son     Social History   Socioeconomic History  . Marital status: Divorced    Spouse name: Not on file  . Number of children: Not on file  . Years of education: Not on file  . Highest education level: Not on file   Occupational History  . Not on file  Tobacco Use  . Smoking status: Former Smoker    Packs/day: 1.00    Years: 33.00    Pack years: 33.00    Types: Cigarettes    Quit date: 07/25/2016    Years since quitting: 3.9  . Smokeless tobacco: Never Used  Vaping Use  . Vaping Use: Never used  Substance and Sexual Activity  . Alcohol use: No  . Drug use: No  . Sexual activity: Not Currently    Birth control/protection: Post-menopausal  Other Topics Concern  . Not on file  Social History Narrative  . Not on file   Social Determinants of Health   Financial Resource Strain: Not on file  Food Insecurity: Not on file  Transportation Needs: Not on file  Physical Activity: Not on file  Stress: Not on file  Social Connections: Not on file  Intimate Partner Violence: Not on file    Review of systems: Review of Systems  Constitutional: Negative for fever and chills.  HENT: Negative.   Eyes: Negative for blurred vision.  Respiratory: as per HPI  Cardiovascular: Negative for chest pain and palpitations.  Gastrointestinal: Negative for vomiting, diarrhea, blood per rectum. Genitourinary: Negative for dysuria, urgency, frequency and hematuria.  Musculoskeletal: Negative for myalgias, back pain and joint pain.  Skin: Negative for itching and rash.  Neurological: Negative for dizziness, tremors, focal weakness, seizures and loss of consciousness.  Endo/Heme/Allergies: Negative for environmental allergies.  Psychiatric/Behavioral: Negative for depression, suicidal ideas and hallucinations.  All other systems reviewed and are negative.  Physical Exam: Blood pressure 118/60, pulse 75, height 5\' 5"  (1.651 m), weight 160 lb 3.2 oz (72.7 kg), SpO2 98 %. Gen:      No acute distress HEENT:  EOMI, sclera anicteric Neck:     No masses; no thyromegaly Lungs:    Clear to auscultation bilaterally; normal respiratory effort CV:         Regular rate and rhythm; no murmurs Abd:      + bowel sounds;  soft, non-tender; no palpable masses, no distension Ext:    No edema; adequate peripheral perfusion Skin:      Warm and dry; no rash Neuro: alert and oriented x 3 Psych: normal mood and affect  Data Reviewed: Imaging:  Chest x-ray 08/25/19-linear atelectasis in the right middle lobe.  Screening CT chest 09/11/2019-moderate emphysema. I have reviewed the images personally  PFTs: 04/16/15 Mild to moderate obstruction with reduction in expiratory flow post inhaled bronchodilator. Normal lung volumes and diffusing capacity.  09/04/19 FVC 3.24 [91%], FEV1 2.48 [91%] F/F 77, DLCO 15.82 [73%] Minimal obstructive airway disease with minimal  restriction or diffusion defect.  Labs: A1AT 08/22/16- 137, PIMM  Sleep: Sleep study 03/02/15 RDI 24.9 Sat 87% Average Sat 91%   Assessment:  COPD GOLD D She was previously off inhalers but now on advair due to increasing dyspnea.  The inhalers are not helping. Follow-up PFTs reviewed with minimal obstruction.  Suspect dyspnea is multifactorial from minimal COPD, cardiac disease, weight gain and deconditioning  Check CBC differential, IgE Stop Advair and start Trelegy If her symptoms continue then consider cardiopulmonary exercise test  Ex-smoker Continue screening CT chest.  OSA Stable on CPAP  Plan/Recommendations: - Stop Advair, start Trelegy - CBC, IgE  Marshell Garfinkel MD [facility] Pager [phone] 06/22/2020, 12:17 PM  CC: Claretta Fraise, MD

## 2020-06-23 DIAGNOSIS — M25569 Pain in unspecified knee: Secondary | ICD-10-CM | POA: Diagnosis not present

## 2020-06-23 DIAGNOSIS — M47812 Spondylosis without myelopathy or radiculopathy, cervical region: Secondary | ICD-10-CM | POA: Diagnosis not present

## 2020-06-23 DIAGNOSIS — G894 Chronic pain syndrome: Secondary | ICD-10-CM | POA: Diagnosis not present

## 2020-06-23 DIAGNOSIS — M47814 Spondylosis without myelopathy or radiculopathy, thoracic region: Secondary | ICD-10-CM | POA: Diagnosis not present

## 2020-06-23 LAB — IGE: IgE (Immunoglobulin E), Serum: 246 kU/L — ABNORMAL HIGH (ref <=114)

## 2020-07-05 DIAGNOSIS — F411 Generalized anxiety disorder: Secondary | ICD-10-CM | POA: Diagnosis not present

## 2020-07-05 DIAGNOSIS — R69 Illness, unspecified: Secondary | ICD-10-CM | POA: Diagnosis not present

## 2020-07-05 DIAGNOSIS — G8921 Chronic pain due to trauma: Secondary | ICD-10-CM | POA: Diagnosis not present

## 2020-07-05 DIAGNOSIS — F900 Attention-deficit hyperactivity disorder, predominantly inattentive type: Secondary | ICD-10-CM | POA: Diagnosis not present

## 2020-07-06 ENCOUNTER — Other Ambulatory Visit: Payer: Self-pay

## 2020-07-06 ENCOUNTER — Encounter: Payer: Self-pay | Admitting: Family Medicine

## 2020-07-06 ENCOUNTER — Ambulatory Visit (INDEPENDENT_AMBULATORY_CARE_PROVIDER_SITE_OTHER): Payer: Medicare HMO | Admitting: Family Medicine

## 2020-07-06 VITALS — BP 130/80 | HR 83 | Temp 97.1°F | Ht 65.0 in | Wt 187.4 lb

## 2020-07-06 DIAGNOSIS — K219 Gastro-esophageal reflux disease without esophagitis: Secondary | ICD-10-CM | POA: Diagnosis not present

## 2020-07-06 DIAGNOSIS — J432 Centrilobular emphysema: Secondary | ICD-10-CM

## 2020-07-06 DIAGNOSIS — I10 Essential (primary) hypertension: Secondary | ICD-10-CM | POA: Diagnosis not present

## 2020-07-06 NOTE — Progress Notes (Signed)
Subjective:  Patient ID: Sarah Chapman, female    DOB: May 22, 1961  Age: 60 y.o. MRN: [id number]  CC: Medical Management of Chronic Issues   HPI Sarah Chapman presents for  follow-up of hypertension. Patient has no history of headache chest pain or shortness of breath or recent cough. Patient also denies symptoms of TIA such as focal numbness or weakness. Patient denies side effects from medication. States taking it regularly. Patient in for follow-up of GERD. Currently asymptomatic taking  PPI daily. There is no chest pain or heartburn. No hematemesis and no melena. No dysphagia or choking. Onset is remote. Progression is stable. Complicating factors, none. Her emphysema is well controlled as long as she uses her inhalers.  Pulmonology recently switched her to Trelegy from Symbicort.  History Sarah Chapman has a past medical history of Allergic rhinitis, Back pain, Chronic headache, COPD (chronic obstructive pulmonary disease) ([place]), Emphysema of lung ([place]), Hyperlipidemia, Hypertension, Neck pain, Sleep apnea, and Vaginal Pap smear, abnormal.   She has a past surgical history that includes Neck surgery; Tonsillectomy; Cesarean section; and Gynecologic cryosurgery.   Her family history includes Bone cancer in her mother; Cancer in her maternal grandmother; Colon cancer (age of onset: 93) in her brother; Heart attack in her brother; Mental illness in her son.She reports that she quit smoking about 3 years ago. Her smoking use included cigarettes. She has a 33.00 pack-year smoking history. She has never used smokeless tobacco. She reports that she does not drink alcohol and does not use drugs.  Current Outpatient Medications on File Prior to Visit  Medication Sig Dispense Refill  . albuterol (VENTOLIN HFA) 108 (90 Base) MCG/ACT inhaler Inhale 2 puffs into the lungs every 6 (six) hours as needed for wheezing or shortness of breath. 18 g 5  . ALPRAZolam (XANAX) 0.5 MG tablet Take 0.5 mg by mouth at bedtime as  needed for anxiety.    Marland Kitchen aspirin 81 MG EC tablet Take 1 tablet by mouth daily.    Marland Kitchen atorvastatin (LIPITOR) 80 MG tablet Take 1 tablet (80 mg total) by mouth daily. 90 tablet 3  . buPROPion (WELLBUTRIN XL) 150 MG 24 hr tablet Take 450 mg by mouth daily.    Marland Kitchen DEXILANT 60 MG capsule TAKE (1) CAPSULE DAILY 90 capsule 0  . Fluticasone-Umeclidin-Vilant (TRELEGY ELLIPTA) 200-62.5-25 MCG/INH AEPB Inhale 1 puff into the lungs daily. 60 each 5  . HYDROcodone-acetaminophen (NORCO) 10-325 MG tablet Take 1 tablet by mouth 4 (four) times daily as needed.    . nortriptyline (PAMELOR) 50 MG capsule Take 50 mg by mouth at bedtime.    Marland Kitchen olmesartan (BENICAR) 40 MG tablet Take 1 tablet (40 mg total) by mouth daily. 30 tablet 0  . tizanidine (ZANAFLEX) 2 MG capsule Take 2 mg by mouth 3 (three) times daily.    Marland Kitchen Ubrogepant (UBRELVY PO) Take by mouth.     No current facility-administered medications on file prior to visit.    ROS Review of Systems  Constitutional: Negative.   HENT: Negative.   Eyes: Negative for visual disturbance.  Respiratory: Negative for shortness of breath.   Cardiovascular: Negative for chest pain.  Gastrointestinal: Negative for abdominal pain.  Musculoskeletal: Negative for arthralgias.    Objective:  BP 130/80   Pulse 83   Temp (!) 97.1 F (36.2 C) (Temporal)   Ht '5\' 5"'  (1.651 m)   Wt 187 lb 6.4 oz (85 kg)   BMI 31.18 kg/m   BP Readings from Last 3  Encounters:  07/06/20 130/80  06/22/20 128/80  03/24/20 126/84    Wt Readings from Last 3 Encounters:  07/06/20 187 lb 6.4 oz (85 kg)  06/22/20 176 lb (79.8 kg)  03/24/20 176 lb 3.2 oz (79.9 kg)     Physical Exam Constitutional:      General: She is not in acute distress.    Appearance: She is well-developed.  HENT:     Head: Normocephalic and atraumatic.  Eyes:     Conjunctiva/sclera: Conjunctivae normal.     Pupils: Pupils are equal, round, and reactive to light.  Neck:     Thyroid: No thyromegaly.   Cardiovascular:     Rate and Rhythm: Normal rate and regular rhythm.     Heart sounds: Normal heart sounds. No murmur heard.   Pulmonary:     Effort: Pulmonary effort is normal. No respiratory distress.     Breath sounds: Normal breath sounds. No wheezing or rales.  Abdominal:     General: Bowel sounds are normal. There is no distension.     Palpations: Abdomen is soft.     Tenderness: There is no abdominal tenderness.  Musculoskeletal:        General: Normal range of motion.     Cervical back: Normal range of motion and neck supple.  Lymphadenopathy:     Cervical: No cervical adenopathy.  Skin:    General: Skin is warm and dry.  Neurological:     Mental Status: She is alert and oriented to person, place, and time.  Psychiatric:        Behavior: Behavior normal.        Thought Content: Thought content normal.        Judgment: Judgment normal.       Assessment & Plan:   Sarah Chapman was seen today for medical management of chronic issues.  Diagnoses and all orders for this visit:  Centrilobular emphysema ([place]) -     CBC with Differential/Platelet -     CMP14+EGFR -     Lipid panel  Gastroesophageal reflux disease without esophagitis -     CBC with Differential/Platelet -     CMP14+EGFR -     Lipid panel  Essential hypertension -     CBC with Differential/Platelet -     CMP14+EGFR -     Lipid panel   Allergies as of 07/06/2020      Reactions   Clindamycin/lincomycin Rash   Penicillins    Unknown      Medication List       Accurate as of July 06, 2020 11:59 PM. If you have any questions, ask your nurse or doctor.        STOP taking these medications   Botox 200 units Solr Generic drug: Botulinum Toxin Type A Stopped by: Claretta Fraise, MD   celecoxib 100 MG capsule Commonly known as: CELEBREX Stopped by: Claretta Fraise, MD   loratadine 10 MG tablet Commonly known as: CLARITIN Stopped by: Claretta Fraise, MD   Nurtec 75 MG Tbdp Generic drug:  Rimegepant Sulfate Stopped by: Claretta Fraise, MD   Nuvigil 250 MG tablet Generic drug: Armodafinil Stopped by: Claretta Fraise, MD   PERDIEM OVERNIGHT RELIEF PO Stopped by: Claretta Fraise, MD   pyridostigmine 60 MG tablet Commonly known as: MESTINON Stopped by: Claretta Fraise, MD     TAKE these medications   albuterol 108 (90 Base) MCG/ACT inhaler Commonly known as: VENTOLIN HFA Inhale 2 puffs into the lungs every 6 (six) hours as  needed for wheezing or shortness of breath.   ALPRAZolam 0.5 MG tablet Commonly known as: XANAX Take 0.5 mg by mouth at bedtime as needed for anxiety. What changed: Another medication with the same name was removed. Continue taking this medication, and follow the directions you see here. Changed by: Claretta Fraise, MD   aspirin 81 MG EC tablet Take 1 tablet by mouth daily.   atorvastatin 80 MG tablet Commonly known as: LIPITOR Take 1 tablet (80 mg total) by mouth daily.   buPROPion 150 MG 24 hr tablet Commonly known as: WELLBUTRIN XL Take 450 mg by mouth daily. What changed: Another medication with the same name was removed. Continue taking this medication, and follow the directions you see here. Changed by: Claretta Fraise, MD   Dexilant 60 MG capsule Generic drug: dexlansoprazole TAKE (1) CAPSULE DAILY   HYDROcodone-acetaminophen 10-325 MG tablet Commonly known as: NORCO Take 1 tablet by mouth 4 (four) times daily as needed.   nortriptyline 50 MG capsule Commonly known as: PAMELOR Take 50 mg by mouth at bedtime. What changed: Another medication with the same name was removed. Continue taking this medication, and follow the directions you see here. Changed by: Claretta Fraise, MD   olmesartan 40 MG tablet Commonly known as: BENICAR Take 1 tablet (40 mg total) by mouth daily.   tizanidine 2 MG capsule Commonly known as: ZANAFLEX Take 2 mg by mouth 3 (three) times daily. What changed: Another medication with the same name was removed.  Continue taking this medication, and follow the directions you see here. Changed by: Claretta Fraise, MD   Trelegy Cleaster Corin 200-62.5-25 MCG/INH Aepb Generic drug: Fluticasone-Umeclidin-Vilant Inhale 1 puff into the lungs daily.   UBRELVY PO Take by mouth.       Follow-up: Return in about 6 months (around 01/03/2021).  Claretta Fraise, M.D.

## 2020-07-07 LAB — CMP14+EGFR
ALT: 27 IU/L (ref 0–32)
AST: 18 IU/L (ref 0–40)
Albumin/Globulin Ratio: 2 (ref 1.2–2.2)
Albumin: 4.6 g/dL (ref 3.8–4.8)
Alkaline Phosphatase: 128 IU/L — ABNORMAL HIGH (ref 44–121)
BUN/Creatinine Ratio: 15 (ref 12–28)
BUN: 14 mg/dL (ref 8–27)
Bilirubin Total: 0.2 mg/dL (ref 0.0–1.2)
CO2: 26 mmol/L (ref 20–29)
Calcium: 9.5 mg/dL (ref 8.7–10.3)
Chloride: 104 mmol/L (ref 96–106)
Creatinine, Ser: 0.95 mg/dL (ref 0.57–1.00)
GFR calc Af Amer: 75 mL/min/{1.73_m2} (ref >59)
GFR calc non Af Amer: 65 mL/min/{1.73_m2} (ref >59)
Globulin, Total: 2.3 g/dL (ref 1.5–4.5)
Glucose: 86 mg/dL (ref 65–99)
Potassium: 4.2 mmol/L (ref 3.5–5.2)
Sodium: 143 mmol/L (ref 134–144)
Total Protein: 6.9 g/dL (ref 6.0–8.5)

## 2020-07-07 LAB — CBC WITH DIFFERENTIAL/PLATELET
Basophils Absolute: 0.1 10*3/uL (ref 0.0–0.2)
Basos: 1 %
EOS (ABSOLUTE): 0.1 10*3/uL (ref 0.0–0.4)
Eos: 1 %
Hematocrit: 37.4 % (ref 34.0–46.6)
Hemoglobin: 12.5 g/dL (ref 11.1–15.9)
Immature Grans (Abs): 0 10*3/uL (ref 0.0–0.1)
Immature Granulocytes: 1 %
Lymphocytes Absolute: 1.7 10*3/uL (ref 0.7–3.1)
Lymphs: 24 %
MCH: 31.1 pg (ref 26.6–33.0)
MCHC: 33.4 g/dL (ref 31.5–35.7)
MCV: 93 fL (ref 79–97)
Monocytes Absolute: 0.5 10*3/uL (ref 0.1–0.9)
Monocytes: 7 %
Neutrophils Absolute: 5 10*3/uL (ref 1.4–7.0)
Neutrophils: 66 %
Platelets: 376 10*3/uL (ref 150–450)
RBC: 4.02 x10E6/uL (ref 3.77–5.28)
RDW: 13.1 % (ref 11.7–15.4)
WBC: 7.4 10*3/uL (ref 3.4–10.8)

## 2020-07-07 LAB — LIPID PANEL
Chol/HDL Ratio: 2.1 ratio (ref 0.0–4.4)
Cholesterol, Total: 235 mg/dL — ABNORMAL HIGH (ref 100–199)
HDL: 111 mg/dL (ref >39)
LDL Chol Calc (NIH): 108 mg/dL — ABNORMAL HIGH (ref 0–99)
Triglycerides: 96 mg/dL (ref 0–149)
VLDL Cholesterol Cal: 16 mg/dL (ref 5–40)

## 2020-07-08 DIAGNOSIS — H40003 Preglaucoma, unspecified, bilateral: Secondary | ICD-10-CM | POA: Diagnosis not present

## 2020-07-09 ENCOUNTER — Encounter: Payer: Self-pay | Admitting: Family Medicine

## 2020-07-14 ENCOUNTER — Other Ambulatory Visit: Payer: Self-pay | Admitting: *Deleted

## 2020-07-14 DIAGNOSIS — I10 Essential (primary) hypertension: Secondary | ICD-10-CM

## 2020-07-14 MED ORDER — OLMESARTAN MEDOXOMIL 40 MG PO TABS: 40.0000 mg | ORAL_TABLET | Freq: Every day | ORAL | 1 refills | Status: DC

## 2020-07-15 ENCOUNTER — Telehealth: Payer: Self-pay

## 2020-07-15 DIAGNOSIS — I10 Essential (primary) hypertension: Secondary | ICD-10-CM

## 2020-07-15 MED ORDER — OLMESARTAN MEDOXOMIL 40 MG PO TABS: 40.0000 mg | ORAL_TABLET | Freq: Every day | ORAL | 1 refills | Status: DC

## 2020-07-15 NOTE — Telephone Encounter (Signed)
Changed pill count on benicar to 100 per insurance request. Sent to [facility]

## 2020-07-28 ENCOUNTER — Telehealth: Payer: Self-pay | Admitting: Cardiology

## 2020-07-28 ENCOUNTER — Other Ambulatory Visit: Payer: Self-pay | Admitting: Nurse Practitioner

## 2020-07-28 NOTE — Telephone Encounter (Signed)
Pt wanted to make Dr. Gardiner Rhyme aware that she recently had lab work done at [facility] and would like for MD to review (labs in epic).

## 2020-07-28 NOTE — Telephone Encounter (Signed)
New message:    Patient states that she had mis place her order for lab work and she states that her PCP did labs on her.  Dr. Quinn Axe office. Phone number [phone].

## 2020-07-28 NOTE — Telephone Encounter (Signed)
Patient has not been seen since 2018. I'll send in a limited 15-month refill of her medications. She needs an office visit for additional refills.

## 2020-07-29 MED ORDER — EZETIMIBE 10 MG PO TABS
10.0000 mg | ORAL_TABLET | Freq: Every day | ORAL | 3 refills | Status: DC
Start: 2020-07-29 — End: 2021-07-29

## 2020-07-29 NOTE — Telephone Encounter (Signed)
LDL remains above goal less than 70 despite atorvastatin 80 mg daily.  Recommend adding Zetia 10 mg daily

## 2020-07-29 NOTE — Telephone Encounter (Signed)
Noted  

## 2020-07-29 NOTE — Telephone Encounter (Signed)
FYI:  I just phoned and spoke with the pt regarding Rx being refilled (only limited supply, due to no office visit). Well she asked why they sent it to [place] because she has been getting it filled by her PCP Dr. Claretta Fraise. (that's why she hasn't been here). So with that being said it is going to look like she is getting this from 2 different doctors. She stated to me that she was going to let the pharmacist know not to contact us after today for this medication. Pt also declined office visit.

## 2020-07-29 NOTE — Telephone Encounter (Signed)
Patient aware and verbalized understanding.  rx sent to pharmacy.

## 2020-08-02 DIAGNOSIS — M5417 Radiculopathy, lumbosacral region: Secondary | ICD-10-CM | POA: Diagnosis not present

## 2020-08-02 DIAGNOSIS — M5136 Other intervertebral disc degeneration, lumbar region: Secondary | ICD-10-CM | POA: Diagnosis not present

## 2020-08-02 DIAGNOSIS — G894 Chronic pain syndrome: Secondary | ICD-10-CM | POA: Diagnosis not present

## 2020-08-02 DIAGNOSIS — Z79899 Other long term (current) drug therapy: Secondary | ICD-10-CM | POA: Diagnosis not present

## 2020-08-02 DIAGNOSIS — Z79891 Long term (current) use of opiate analgesic: Secondary | ICD-10-CM | POA: Diagnosis not present

## 2020-08-02 DIAGNOSIS — M47814 Spondylosis without myelopathy or radiculopathy, thoracic region: Secondary | ICD-10-CM | POA: Diagnosis not present

## 2020-08-18 DIAGNOSIS — R69 Illness, unspecified: Secondary | ICD-10-CM | POA: Diagnosis not present

## 2020-08-18 DIAGNOSIS — G4731 Primary central sleep apnea: Secondary | ICD-10-CM | POA: Diagnosis not present

## 2020-08-30 DIAGNOSIS — M47812 Spondylosis without myelopathy or radiculopathy, cervical region: Secondary | ICD-10-CM | POA: Diagnosis not present

## 2020-08-30 DIAGNOSIS — M47814 Spondylosis without myelopathy or radiculopathy, thoracic region: Secondary | ICD-10-CM | POA: Diagnosis not present

## 2020-08-30 DIAGNOSIS — G894 Chronic pain syndrome: Secondary | ICD-10-CM | POA: Diagnosis not present

## 2020-08-30 DIAGNOSIS — M7918 Myalgia, other site: Secondary | ICD-10-CM | POA: Diagnosis not present

## 2020-08-30 DIAGNOSIS — M5136 Other intervertebral disc degeneration, lumbar region: Secondary | ICD-10-CM | POA: Diagnosis not present

## 2020-08-31 ENCOUNTER — Ambulatory Visit: Payer: Medicare HMO | Admitting: Cardiology

## 2020-09-01 ENCOUNTER — Other Ambulatory Visit: Payer: Self-pay | Admitting: Family Medicine

## 2020-09-01 DIAGNOSIS — R69 Illness, unspecified: Secondary | ICD-10-CM | POA: Diagnosis not present

## 2020-09-01 DIAGNOSIS — F411 Generalized anxiety disorder: Secondary | ICD-10-CM | POA: Diagnosis not present

## 2020-09-01 DIAGNOSIS — F900 Attention-deficit hyperactivity disorder, predominantly inattentive type: Secondary | ICD-10-CM | POA: Diagnosis not present

## 2020-09-01 DIAGNOSIS — G8921 Chronic pain due to trauma: Secondary | ICD-10-CM | POA: Diagnosis not present

## 2020-09-06 ENCOUNTER — Other Ambulatory Visit: Payer: Self-pay | Admitting: *Deleted

## 2020-09-06 DIAGNOSIS — Z87891 Personal history of nicotine dependence: Secondary | ICD-10-CM

## 2020-09-09 DIAGNOSIS — G518 Other disorders of facial nerve: Secondary | ICD-10-CM | POA: Diagnosis not present

## 2020-09-09 DIAGNOSIS — M542 Cervicalgia: Secondary | ICD-10-CM | POA: Diagnosis not present

## 2020-09-09 DIAGNOSIS — G43719 Chronic migraine without aura, intractable, without status migrainosus: Secondary | ICD-10-CM | POA: Diagnosis not present

## 2020-09-27 DIAGNOSIS — G894 Chronic pain syndrome: Secondary | ICD-10-CM | POA: Diagnosis not present

## 2020-09-27 DIAGNOSIS — M5459 Other low back pain: Secondary | ICD-10-CM | POA: Diagnosis not present

## 2020-09-27 DIAGNOSIS — M25569 Pain in unspecified knee: Secondary | ICD-10-CM | POA: Diagnosis not present

## 2020-09-27 DIAGNOSIS — M5136 Other intervertebral disc degeneration, lumbar region: Secondary | ICD-10-CM | POA: Diagnosis not present

## 2020-09-29 NOTE — Progress Notes (Signed)
Cardiology Office Note:    Date:  09/30/2020   ID:  Sarah Chapman, DOB 19-Jul-1960, MRN [id number]  PCP:  Claretta Fraise, MD   [place]  Cardiologist:  Donato Heinz, MD   Referring MD: Claretta Fraise, MD   Reason for visit: 6 month follow-up  History of Present Illness:    Sarah Chapman is a 60 y.o. female with a hx of hypertension, COPD, hyperlipidemia, OSA waiting on new mask, prev smoker with 22 pack years, moderate bilateral carotid stenosis (followed by vascular at [facility]) who presents for follow-up.  She was referred by Dr. Livia Snellen for evaluation of dyspnea, initially seen on 09/24/2019. Coronary CTA on 10/23/2019 showed nonobstructive CAD with mod prox LAD disease and mild LCX and RCA disease.  She last saw Dr. Gardiner Rhyme in 02/2020.  With LDL >70, Zetia was added.  She mentioned leg pain and LE arterial ABIs were normal.  Today, she comes to clinic alone.  She denies chest pain, lightheadedness, syncope, orthopnea or significant pedal edema. She complains of SOB.  Her pulmonologist has changed her from Advair to Trilogy 2-3 months ago per pt.  Despite her SOB, she is able to do her ADLs without significant difficulty.  She has increased her activity in the past month.  Otherwise, she complains of dull ache in her feet at night, intermittent left face numbness and some difficulty in word finding/pronounciation.  She denies one sided weakness or vision changes.  She has some gasping at night and snoring.  She is awaiting a new OSA mask.  Past Medical History:  Diagnosis Date  . Allergic rhinitis   . Back pain   . Chronic headache   . COPD (chronic obstructive pulmonary disease) ([place])   . Emphysema of lung ([place])   . Hyperlipidemia   . Hypertension   . Neck pain   . Sleep apnea   . Vaginal Pap smear, abnormal     Past Surgical History:  Procedure Laterality Date  . CESAREAN SECTION    . GYNECOLOGIC CRYOSURGERY    . NECK SURGERY    . TONSILLECTOMY       Current Medications: Current Meds  Medication Sig  . albuterol (VENTOLIN HFA) 108 (90 Base) MCG/ACT inhaler INHALE 2 PUFFS EVERY 6 HOURS AS NEEDED FOR WHEEZING OR SHORTNESS OF BREATH  . ALPRAZolam (XANAX) 0.5 MG tablet Take 0.5 mg by mouth at bedtime as needed for anxiety.  Marland Kitchen aspirin 81 MG EC tablet Take 1 tablet by mouth daily.  Marland Kitchen atorvastatin (LIPITOR) 80 MG tablet Take 1 tablet (80 mg total) by mouth daily.  Marland Kitchen buPROPion (WELLBUTRIN XL) 150 MG 24 hr tablet Take 450 mg by mouth daily.  Marland Kitchen DEXILANT 60 MG capsule TAKE (1) CAPSULE DAILY  . ezetimibe (ZETIA) 10 MG tablet Take 1 tablet (10 mg total) by mouth daily.  . Fluticasone-Umeclidin-Vilant (TRELEGY ELLIPTA) 200-62.5-25 MCG/INH AEPB Inhale 1 puff into the lungs daily.  Marland Kitchen HYDROcodone-acetaminophen (NORCO) 10-325 MG tablet Take 1 tablet by mouth 4 (four) times daily as needed.  . nortriptyline (PAMELOR) 50 MG capsule Take 50 mg by mouth at bedtime.  Marland Kitchen olmesartan (BENICAR) 40 MG tablet Take 1 tablet (40 mg total) by mouth daily.  . tizanidine (ZANAFLEX) 2 MG capsule Take 2 mg by mouth 3 (three) times daily.  Marland Kitchen Ubrogepant (UBRELVY PO) Take by mouth.     Allergies:   Clindamycin/lincomycin and Penicillins   Social History   Socioeconomic History  . Marital status:  Divorced    Spouse name: Not on file  . Number of children: Not on file  . Years of education: Not on file  . Highest education level: Not on file  Occupational History  . Not on file  Tobacco Use  . Smoking status: Former Smoker    Packs/day: 1.00    Years: 33.00    Pack years: 33.00    Types: Cigarettes    Quit date: 07/25/2016    Years since quitting: 4.1  . Smokeless tobacco: Never Used  Vaping Use  . Vaping Use: Never used  Substance and Sexual Activity  . Alcohol use: No  . Drug use: No  . Sexual activity: Not Currently    Birth control/protection: Post-menopausal  Other Topics Concern  . Not on file  Social History Narrative  . Not on file    Social Determinants of Health   Financial Resource Strain: Not on file  Food Insecurity: Not on file  Transportation Needs: Not on file  Physical Activity: Not on file  Stress: Not on file  Social Connections: Not on file     Family History: The patient's family history includes Bone cancer in her mother; Cancer in her maternal grandmother; Colon cancer (age of onset: 14) in her brother; Heart attack in her brother; Mental illness in her son.  ROS:   Please see the history of present illness.     EKGs/Labs/Other Studies Reviewed:    EKG:  EKG is ordered today.  The ekg ordered today demonstrates NSR, rate 74, no ST-T changes.    Recent Labs: 07/06/2020: ALT 27; BUN 14; Creatinine, Ser 0.95; Hemoglobin 12.5; Platelets 376; Potassium 4.2; Sodium 143  Recent Lipid Panel    Component Value Date/Time   CHOL 235 (H) 07/06/2020 1414   TRIG 96 07/06/2020 1414   HDL 111 07/06/2020 1414   CHOLHDL 2.1 07/06/2020 1414   LDLCALC 108 (H) 07/06/2020 1414     Risk Assessment/Calculations:       Physical Exam:    VS:  BP 132/88   Pulse 74   Ht 5\' 5"  (1.651 m)   Wt 169 lb (76.7 kg)   BMI 28.12 kg/m     Wt Readings from Last 3 Encounters:  09/30/20 169 lb (76.7 kg)  07/06/20 187 lb 6.4 oz (85 kg)  06/22/20 176 lb (79.8 kg)     GEN:  Well nourished, well developed in no acute distress HEENT: Normal NECK: No JVD; No carotid bruits LYMPHATICS: No lymphadenopathy CARDIAC: RRR, no murmurs, rubs, gallops RESPIRATORY:  Clear to auscultation without rales, wheezing or rhonchi  ABDOMEN: Soft, non-tender, non-distended MUSCULOSKELETAL:  No edema; No deformity  SKIN: Warm and dry NEUROLOGIC:  Alert and oriented x 3 PSYCHIATRIC:  Normal affect   ASSESSMENT AND PLAN   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 - Continue statin/Zetia, ASA 81mg .   - Check fasting lipids and LFTs in 8 weeks.  Goal  LDL <70.  Hypertension, reasonably controlled - Continue olmesartan 40 mg daily - Told pt if BP gets >140/90, I would recommend adding a second agent.  Bilateral moderate carotid stenosis - Continue aspirin, statin.  Repeat carotid duplex in 01/2021.  I am not convinced her intermittent left sided face numbness is TIA related.  She has strong bilateral grip, no speech slurring and no vision changes.     Medication Adjustments/Labs and Tests Ordered: Current medicines are reviewed  at length with the patient today.  Concerns regarding medicines are outlined above.  No orders of the defined types were placed in this encounter.  No orders of the defined types were placed in this encounter.   There are no Patient Instructions on file for this visit.   Signed, Warren Lacy, PA-C  09/30/2020 11:14 AM    [facility]

## 2020-09-30 ENCOUNTER — Other Ambulatory Visit: Payer: Self-pay

## 2020-09-30 ENCOUNTER — Encounter: Payer: Self-pay | Admitting: Physician Assistant

## 2020-09-30 ENCOUNTER — Ambulatory Visit: Payer: Medicare HMO | Admitting: Physician Assistant

## 2020-09-30 VITALS — BP 132/88 | HR 74 | Ht 65.0 in | Wt 169.0 lb

## 2020-09-30 DIAGNOSIS — E785 Hyperlipidemia, unspecified: Secondary | ICD-10-CM | POA: Diagnosis not present

## 2020-09-30 DIAGNOSIS — I2510 Atherosclerotic heart disease of native coronary artery without angina pectoris: Secondary | ICD-10-CM | POA: Diagnosis not present

## 2020-09-30 DIAGNOSIS — I6529 Occlusion and stenosis of unspecified carotid artery: Secondary | ICD-10-CM

## 2020-09-30 DIAGNOSIS — I10 Essential (primary) hypertension: Secondary | ICD-10-CM | POA: Diagnosis not present

## 2020-09-30 NOTE — Patient Instructions (Addendum)
Medication Instructions:  Your physician recommends that you continue on your current medications as directed. Please refer to the Current Medication list given to you today.  *If you need a refill on your cardiac medications before your next appointment, please call your pharmacy*  Lab Work: Your physician recommends that you return for lab work in 8 weeks-11/25/20:   Fasting Lipid Panel-DO NOT EAT OR DRINK PAST MIDNIGHT. OK TO HAVE WATER.   Hepatic (Liver) Function Test  If you have labs (blood work) drawn today and your tests are completely normal, you will receive your results only by: Marland Kitchen MyChart Message (if you have MyChart) OR . A paper copy in the mail If you have any lab test that is abnormal or we need to change your treatment, we will call you to review the results.  Testing/Procedures: Your physician has requested that you have a carotid duplex. This test is an ultrasound of the carotid arteries in your neck. It looks at blood flow through these arteries that supply the brain with blood. Allow one hour for this exam. There are no restrictions or special instructions.   Please schedule 2-3 days prior to follow up appointment   Follow-Up: At [facility], you and your health needs are our priority.  As part of our continuing mission to provide you with exceptional heart care, we have created designated Provider Care Teams.  These Care Teams include your primary Cardiologist (physician) and Advanced Practice Providers (APPs -  Physician Assistants and Nurse Practitioners) who all work together to provide you with the care you need, when you need it.   Your next appointment:   6 month(s)  The format for your next appointment:   In Person  Provider:   Oswaldo Milian, MD  Other Instructions

## 2020-10-08 ENCOUNTER — Ambulatory Visit (HOSPITAL_COMMUNITY): Payer: Medicare HMO

## 2020-10-21 DIAGNOSIS — R69 Illness, unspecified: Secondary | ICD-10-CM | POA: Diagnosis not present

## 2020-10-21 DIAGNOSIS — F900 Attention-deficit hyperactivity disorder, predominantly inattentive type: Secondary | ICD-10-CM | POA: Diagnosis not present

## 2020-10-21 DIAGNOSIS — G8921 Chronic pain due to trauma: Secondary | ICD-10-CM | POA: Diagnosis not present

## 2020-10-21 DIAGNOSIS — F411 Generalized anxiety disorder: Secondary | ICD-10-CM | POA: Diagnosis not present

## 2020-10-22 ENCOUNTER — Ambulatory Visit (HOSPITAL_COMMUNITY)
Admission: RE | Admit: 2020-10-22 | Discharge: 2020-10-22 | Disposition: A | Discharge status: Home / Self Care | Payer: Medicare HMO | Source: Ambulatory Visit | Attending: Acute Care | Admitting: Acute Care

## 2020-10-22 ENCOUNTER — Other Ambulatory Visit: Payer: Self-pay

## 2020-10-22 DIAGNOSIS — Z87891 Personal history of nicotine dependence: Secondary | ICD-10-CM | POA: Diagnosis not present

## 2020-10-27 DIAGNOSIS — M792 Neuralgia and neuritis, unspecified: Secondary | ICD-10-CM | POA: Diagnosis not present

## 2020-10-27 DIAGNOSIS — G894 Chronic pain syndrome: Secondary | ICD-10-CM | POA: Diagnosis not present

## 2020-10-27 DIAGNOSIS — R69 Illness, unspecified: Secondary | ICD-10-CM | POA: Diagnosis not present

## 2020-10-27 DIAGNOSIS — G609 Hereditary and idiopathic neuropathy, unspecified: Secondary | ICD-10-CM | POA: Diagnosis not present

## 2020-11-01 DIAGNOSIS — M47812 Spondylosis without myelopathy or radiculopathy, cervical region: Secondary | ICD-10-CM | POA: Diagnosis not present

## 2020-11-01 DIAGNOSIS — G894 Chronic pain syndrome: Secondary | ICD-10-CM | POA: Diagnosis not present

## 2020-11-01 DIAGNOSIS — M5136 Other intervertebral disc degeneration, lumbar region: Secondary | ICD-10-CM | POA: Diagnosis not present

## 2020-11-01 DIAGNOSIS — Z79891 Long term (current) use of opiate analgesic: Secondary | ICD-10-CM | POA: Diagnosis not present

## 2020-11-01 DIAGNOSIS — Z79899 Other long term (current) drug therapy: Secondary | ICD-10-CM | POA: Diagnosis not present

## 2020-11-01 DIAGNOSIS — M47814 Spondylosis without myelopathy or radiculopathy, thoracic region: Secondary | ICD-10-CM | POA: Diagnosis not present

## 2020-11-02 ENCOUNTER — Other Ambulatory Visit: Payer: Self-pay

## 2020-11-02 ENCOUNTER — Ambulatory Visit (INDEPENDENT_AMBULATORY_CARE_PROVIDER_SITE_OTHER): Payer: Medicare HMO | Admitting: Nurse Practitioner

## 2020-11-02 ENCOUNTER — Encounter: Payer: Self-pay | Admitting: Nurse Practitioner

## 2020-11-02 ENCOUNTER — Other Ambulatory Visit: Payer: Self-pay | Admitting: Nurse Practitioner

## 2020-11-02 VITALS — BP 150/83 | HR 94 | Temp 98.0°F | Ht 65.0 in | Wt 167.0 lb

## 2020-11-02 DIAGNOSIS — B351 Tinea unguium: Secondary | ICD-10-CM | POA: Diagnosis not present

## 2020-11-02 MED ORDER — EFINACONAZOLE 10 % EX SOLN
1.0000 | Freq: Every day | CUTANEOUS | 1 refills | Status: DC
Start: 2020-11-02 — End: 2021-02-24

## 2020-11-02 NOTE — Assessment & Plan Note (Signed)
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  Efinaconazole 10% noted.  Education provided to patient on long-term therapy and to follow-up with worsening or unresolved symptoms.  Rx sent to pharmacy.

## 2020-11-02 NOTE — Progress Notes (Signed)
Acute Office Visit  Subjective:    Patient ID: Sarah Chapman, female    DOB: [birth year], 60 y.o.   MRN: [id number]  Chief Complaint  Patient presents with  . Nail Problem    HPI Patient is a 60 year old female who presented to clinic today for toenail fungus.  Symptoms have been present for a while.  Patient believes she contracted it from her friend and has tried everything over-the-counter with no resolution.  No redness, pain, tenderness or fever.  Past Medical History:  Diagnosis Date  . Allergic rhinitis   . Back pain   . Chronic headache   . COPD (chronic obstructive pulmonary disease) ([place])   . Emphysema of lung ([place])   . Hyperlipidemia   . Hypertension   . Neck pain   . Sleep apnea   . Vaginal Pap smear, abnormal     Past Surgical History:  Procedure Laterality Date  . CESAREAN SECTION    . GYNECOLOGIC CRYOSURGERY    . NECK SURGERY    . TONSILLECTOMY      Family History  Problem Relation Age of Onset  . Bone cancer Mother   . Colon cancer Brother 68  . Heart attack Brother   . Cancer Maternal Grandmother   . Mental illness Son     Social History   Socioeconomic History  . Marital status: Divorced    Spouse name: Not on file  . Number of children: Not on file  . Years of education: Not on file  . Highest education level: Not on file  Occupational History  . Not on file  Tobacco Use  . Smoking status: Former Smoker    Packs/day: 1.00    Years: 33.00    Pack years: 33.00    Types: Cigarettes    Quit date: 07/25/2016    Years since quitting: 4.2  . Smokeless tobacco: Never Used  Vaping Use  . Vaping Use: Never used  Substance and Sexual Activity  . Alcohol use: No  . Drug use: No  . Sexual activity: Not Currently    Birth control/protection: Post-menopausal  Other Topics Concern  . Not on file  Social History Narrative  . Not on file   Social Determinants of Health   Financial Resource Strain: Not on file  Food Insecurity: Not on file   Transportation Needs: Not on file  Physical Activity: Not on file  Stress: Not on file  Social Connections: Not on file  Intimate Partner Violence: Not on file    Outpatient Medications Prior to Visit  Medication Sig Dispense Refill  . albuterol (VENTOLIN HFA) 108 (90 Base) MCG/ACT inhaler INHALE 2 PUFFS EVERY 6 HOURS AS NEEDED FOR WHEEZING OR SHORTNESS OF BREATH 18 g 3  . ALPRAZolam (XANAX) 0.5 MG tablet Take 0.5 mg by mouth at bedtime as needed for anxiety.    . Armodafinil 250 MG tablet Take 250 mg by mouth daily.    Marland Kitchen aspirin 81 MG EC tablet Take 1 tablet by mouth daily.    Marland Kitchen buPROPion (WELLBUTRIN XL) 150 MG 24 hr tablet Take 450 mg by mouth daily.    Marland Kitchen DEXILANT 60 MG capsule TAKE (1) CAPSULE DAILY 90 capsule 0  . Fluticasone-Umeclidin-Vilant (TRELEGY ELLIPTA) 200-62.5-25 MCG/INH AEPB Inhale 1 puff into the lungs daily. 60 each 5  . HYDROcodone-acetaminophen (NORCO) 10-325 MG tablet Take 1 tablet by mouth 4 (four) times daily as needed.    . nortriptyline (PAMELOR) 50 MG capsule Take 50 mg by  mouth at bedtime.    Marland Kitchen olmesartan (BENICAR) 40 MG tablet Take 1 tablet (40 mg total) by mouth daily. 100 tablet 1  . tizanidine (ZANAFLEX) 2 MG capsule Take 2 mg by mouth 3 (three) times daily.    Marland Kitchen Ubrogepant (UBRELVY PO) Take by mouth.    Marland Kitchen atorvastatin (LIPITOR) 80 MG tablet Take 1 tablet (80 mg total) by mouth daily. 90 tablet 3  . ezetimibe (ZETIA) 10 MG tablet Take 1 tablet (10 mg total) by mouth daily. 90 tablet 3   No facility-administered medications prior to visit.    Allergies  Allergen Reactions  . Clindamycin/Lincomycin Rash  . Penicillins     Unknown     Review of Systems  Constitutional: Negative.   HENT: Negative.   Respiratory: Negative.   Cardiovascular: Negative.   Gastrointestinal: Negative.   Musculoskeletal: Negative.   Skin: Negative for color change and rash.  All other systems reviewed and are negative.      Objective:    Physical Exam Vitals and  nursing note reviewed.  Constitutional:      Appearance: Normal appearance.  HENT:     Head: Normocephalic.     Nose: Nose normal.  Eyes:     Conjunctiva/sclera: Conjunctivae normal.  Cardiovascular:     Rate and Rhythm: Normal rate and regular rhythm.     Pulses: Normal pulses.     Heart sounds: Normal heart sounds.  Pulmonary:     Effort: Pulmonary effort is normal.     Breath sounds: Normal breath sounds.  Abdominal:     General: Bowel sounds are normal.  Musculoskeletal:     Left foot: Normal range of motion. No deformity or bunion.  Feet:     Right foot:     Toenail Condition: Right toenails are abnormally thick. Fungal disease present. Neurological:     Mental Status: She is alert and oriented to person, place, and time.     BP (!) 150/83   Pulse 94   Temp 98 F (36.7 C) (Temporal)   Ht 5\' 5"  (1.651 m)   Wt 167 lb (75.8 kg)   SpO2 96%   BMI 27.79 kg/m  Wt Readings from Last 3 Encounters:  11/02/20 167 lb (75.8 kg)  09/30/20 169 lb (76.7 kg)  07/06/20 187 lb 6.4 oz (85 kg)    Health Maintenance Due  Topic Date Due  . Pneumococcal Vaccine 7-27 Years old (1 of 4 - PCV13) Never done  . Hepatitis C Screening  Never done  . MAMMOGRAM  06/23/2019  . COVID-19 Vaccine (3 - Booster for Moderna series) 02/22/2020  . PAP SMEAR-Modifier  11/06/2020    There are no preventive care reminders to display for this patient.   Lab Results  Component Value Date   TSH 2.440 07/19/2016   Lab Results  Component Value Date   WBC 7.4 07/06/2020   HGB 12.5 07/06/2020   HCT 37.4 07/06/2020   MCV 93 07/06/2020   PLT 376 07/06/2020   Lab Results  Component Value Date   NA 143 07/06/2020   K 4.2 07/06/2020   CO2 26 07/06/2020   GLUCOSE 86 07/06/2020   BUN 14 07/06/2020   CREATININE 0.95 07/06/2020   BILITOT 0.2 07/06/2020   ALKPHOS 128 (H) 07/06/2020   AST 18 07/06/2020   ALT 27 07/06/2020   PROT 6.9 07/06/2020   ALBUMIN 4.6 07/06/2020   CALCIUM 9.5 07/06/2020    Lab Results  Component Value Date   CHOL  235 (H) 07/06/2020   Lab Results  Component Value Date   HDL 111 07/06/2020   Lab Results  Component Value Date   LDLCALC 108 (H) 07/06/2020   Lab Results  Component Value Date   TRIG 96 07/06/2020   Lab Results  Component Value Date   CHOLHDL 2.1 07/06/2020   No results found for: HGBA1C     Assessment & Plan:   Problem List Items Addressed This Visit      Musculoskeletal and Integument   Onychomycosis - Primary    On  assessment only right  great toe is  affected with fungus.  Efinaconazole 10% noted.  Education provided to patient on long-term therapy and to follow-up with worsening or unresolved symptoms.  Rx sent to pharmacy.      Relevant Medications   Efinaconazole 10 % SOLN       Meds ordered this encounter  Medications  . Efinaconazole 10 % SOLN    Sig: Apply 1 application topically daily.    Dispense:  8 mL    Refill:  1    Order Specific Question:   Supervising Provider    Answer:   Janora Norlander [[id number]]     Ivy Lynn, NP

## 2020-11-02 NOTE — Patient Instructions (Signed)

## 2020-11-03 ENCOUNTER — Telehealth: Payer: Self-pay | Admitting: Internal Medicine

## 2020-11-03 NOTE — Telephone Encounter (Signed)
Returned the pt's call and LMOVM for her to call

## 2020-11-03 NOTE — Telephone Encounter (Signed)
Pt is aware of her OV on 6/22 but was asking for enough dexilant to be called into her pharmacy to hold her until her appt date. She uses [facility].

## 2020-11-04 NOTE — Progress Notes (Signed)
I have attempted to call the patient with the results of her scan. There was no answer. I left a  HIPPA compliant message on her VM, asking her to call the office for results. I left the office contact number.  Langley Gauss, she needs a 3 month follow up for a 4A. Radiology feels it is infectious / inflammatory, so will reassess at follow up. I will try to call her again in the next few days. Fax results to PCP and let them know the plan. Thanks so much  She does have CAD. She is on a statin. Thanks so much

## 2020-11-05 ENCOUNTER — Other Ambulatory Visit: Payer: Self-pay | Admitting: Nurse Practitioner

## 2020-11-05 ENCOUNTER — Telehealth: Payer: Self-pay | Admitting: Family Medicine

## 2020-11-05 DIAGNOSIS — B351 Tinea unguium: Secondary | ICD-10-CM

## 2020-11-05 DIAGNOSIS — G4731 Primary central sleep apnea: Secondary | ICD-10-CM | POA: Diagnosis not present

## 2020-11-05 MED ORDER — DEXLANSOPRAZOLE 60 MG PO CPDR: 60.0000 mg | DELAYED_RELEASE_CAPSULE | Freq: Every day | ORAL | 0 refills | Status: DC

## 2020-11-05 MED ORDER — KERASAL FUNGAL NAIL RENEWAL EX SOLN: 1.0000 "application " | Freq: Two times a day (BID) | CUTANEOUS | 5 refills | Status: DC

## 2020-11-05 NOTE — Telephone Encounter (Signed)
Completed.

## 2020-11-05 NOTE — Telephone Encounter (Signed)
This pt called again this morning wanting Dexilant sent in (at least enough) until her upcoming ov. She stated we called her in a Rx last month but her appt she stated is 06/22. I advised her that I would send to the doctor and was there anything else she had used and she advised me that tums was not taking care of her problem.

## 2020-11-05 NOTE — Addendum Note (Signed)
Addended by: Annitta Needs on: 11/05/2020 10:34 AM   Modules accepted: Orders

## 2020-11-05 NOTE — Telephone Encounter (Signed)
Patient aware resent to [facility] per patient

## 2020-11-17 ENCOUNTER — Other Ambulatory Visit: Payer: Self-pay

## 2020-11-17 ENCOUNTER — Ambulatory Visit: Payer: Medicare HMO | Admitting: Gastroenterology

## 2020-11-17 ENCOUNTER — Telehealth: Payer: Self-pay | Admitting: Acute Care

## 2020-11-17 ENCOUNTER — Encounter: Payer: Self-pay | Admitting: Gastroenterology

## 2020-11-17 VITALS — BP 140/86 | HR 96 | Temp 97.1°F | Ht 65.0 in | Wt 163.4 lb

## 2020-11-17 DIAGNOSIS — Z87891 Personal history of nicotine dependence: Secondary | ICD-10-CM

## 2020-11-17 DIAGNOSIS — K5903 Drug induced constipation: Secondary | ICD-10-CM | POA: Diagnosis not present

## 2020-11-17 DIAGNOSIS — K219 Gastro-esophageal reflux disease without esophagitis: Secondary | ICD-10-CM | POA: Diagnosis not present

## 2020-11-17 DIAGNOSIS — R109 Unspecified abdominal pain: Secondary | ICD-10-CM | POA: Diagnosis not present

## 2020-11-17 MED ORDER — PANTOPRAZOLE SODIUM 40 MG PO TBEC: 40.0000 mg | DELAYED_RELEASE_TABLET | Freq: Every day | ORAL | 3 refills | Status: DC

## 2020-11-17 NOTE — Progress Notes (Signed)
Referring Provider: Claretta Fraise, MD Primary Care Physician:  Claretta Fraise, MD Primary GI Physician: Abbey Chatters  Chief Complaint  Patient presents with   Abdominal Pain    RUQ, comes/goes x 6 months   Gastroesophageal Reflux    Lots of indigestion   HPI:   Sarah Chapman is a 60 y.o. female presenting today for follow up on GERD to obtain refill of PPI. Last seen by Dr. Oneida Alar in December of 2019. She is reporting more constipation and Right/mid/lateral abdominal pain today that has been ongoing.  GERD: Currently on dexilant, states it is working well for her, however, it is very expensive, inquiring if she can try another PPI once her current Rx runs out. Denies nausea, vomiting, dysphagia or odynophagia.   Constipation with Abdominal Pain:  reports Right/mid/lateral abdominal pain that occurs 3-4x/week with associated bloating. Reports pain resolves if she has a good bowel movement. Reports BMs daily ONLY when she takes a laxative (ex lax) every night. Takes up to 6 laxatives in 48 hours, sometimes also takes a stool softener. Tried linzess in the past, she is unsure if it worked well for her or not. States that she does not feel like she is completely emptying her bowels. Takes norco regularly, every 4-6 hours.   Denies any blood in stools or black stools, no dysphagia, hematemesis or weight loss.  Denies ETOH, 81mg  aspirin daily but no other NSAIDs.   Last Colonoscopy:(01/07/15) internal hemorrhoids, diverticulosis in sigmoid colon, exam otherwise normal, no biopsies.   Last Endoscopy: unsure if she has ever had one  Recommendations:  Screening colonoscopy in 2026  Past Medical History:  Diagnosis Date   Allergic rhinitis    Back pain    Chronic headache    COPD (chronic obstructive pulmonary disease) ([facility])    Emphysema of lung ([facility])    Hyperlipidemia    Hypertension    Neck pain    Sleep apnea    Vaginal Pap smear, abnormal     Past Surgical History:  Procedure  Laterality Date   CESAREAN SECTION     GYNECOLOGIC CRYOSURGERY     NECK SURGERY     TONSILLECTOMY      Current Outpatient Medications  Medication Sig Dispense Refill   albuterol (VENTOLIN HFA) 108 (90 Base) MCG/ACT inhaler INHALE 2 PUFFS EVERY 6 HOURS AS NEEDED FOR WHEEZING OR SHORTNESS OF BREATH 18 g 3   ALPRAZolam (XANAX) 0.5 MG tablet Take 0.5 mg by mouth at bedtime as needed for anxiety.     Armodafinil 250 MG tablet Take 250 mg by mouth daily.     aspirin 81 MG EC tablet Take 1 tablet by mouth daily.     atorvastatin (LIPITOR) 80 MG tablet Take 1 tablet (80 mg total) by mouth daily. 90 tablet 3   buPROPion (WELLBUTRIN XL) 150 MG 24 hr tablet Take 450 mg by mouth daily.     Dermatological Products, Misc. (KERASAL FUNGAL NAIL RENEWAL) SOLN Apply 1 application topically 2 (two) times daily. 10 mL 5   dexlansoprazole (DEXILANT) 60 MG capsule Take 1 capsule (60 mg total) by mouth daily. 90 capsule 0   Efinaconazole 10 % SOLN Apply 1 application topically daily. 8 mL 1   ezetimibe (ZETIA) 10 MG tablet Take 1 tablet (10 mg total) by mouth daily. 90 tablet 3   Fluticasone-Umeclidin-Vilant (TRELEGY ELLIPTA) 200-62.5-25 MCG/INH AEPB Inhale 1 puff into the lungs daily. 60 each 5   HYDROcodone-acetaminophen (NORCO) 10-325 MG tablet Take 1  tablet by mouth 4 (four) times daily as needed.     nortriptyline (PAMELOR) 50 MG capsule Take 50 mg by mouth at bedtime.     olmesartan (BENICAR) 40 MG tablet Take 1 tablet (40 mg total) by mouth daily. 100 tablet 1   tizanidine (ZANAFLEX) 2 MG capsule Take 2 mg by mouth 3 (three) times daily.     Ubrogepant (UBRELVY PO) Take by mouth.     No current facility-administered medications for this visit.    Allergies as of 11/17/2020 - Review Complete 11/17/2020  Allergen Reaction Noted   Clindamycin/lincomycin Rash 11/27/2012   Penicillins  04/03/2014    Family History  Problem Relation Age of Onset   Bone cancer Mother    Colon cancer Brother 62    Heart attack Brother    Cancer Maternal Grandmother    Mental illness Son     Social History   Socioeconomic History   Marital status: Divorced    Spouse name: Not on file   Number of children: Not on file   Years of education: Not on file   Highest education level: Not on file  Occupational History   Not on file  Tobacco Use   Smoking status: Former    Packs/day: 1.00    Years: 33.00    Pack years: 33.00    Types: Cigarettes    Quit date: 07/25/2016    Years since quitting: 4.3   Smokeless tobacco: Never  Vaping Use   Vaping Use: Never used  Substance and Sexual Activity   Alcohol use: No   Drug use: No   Sexual activity: Not Currently    Birth control/protection: Post-menopausal  Other Topics Concern   Not on file  Social History Narrative   Not on file   Social Determinants of Health   Financial Resource Strain: Not on file  Food Insecurity: Not on file  Transportation Needs: Not on file  Physical Activity: Not on file  Stress: Not on file  Social Connections: Not on file    Review of Systems: Gen: Denies fever, chills, anorexia. Denies fatigue, weakness, weight loss.  CV: Denies chest pain, palpitations, syncope, peripheral edema, and claudication. Resp: Denies dyspnea at rest, cough, wheezing, coughing up blood, and pleurisy. GI: Denies vomiting blood, jaundice, and fecal incontinence. Denies dysphagia or odynophagia. Endorses Constipation. Derm: Denies rash, itching, dry skin Psych: Denies depression, anxiety, memory loss, confusion. No homicidal or suicidal ideation.  Heme: Denies bruising, bleeding, and enlarged lymph nodes.  Physical Exam: BP 140/86   Pulse 96   Temp (!) 97.1 F (36.2 C)   Ht 5\' 5"  (1.651 m)   Wt 163 lb 6.4 oz (74.1 kg)   BMI 27.19 kg/m  General:   Alert and oriented. No distress noted. Pleasant and cooperative.  Head:  Normocephalic and atraumatic. Eyes:  Conjuctiva clear without scleral icterus. Mouth:  Oral mucosa pink and  moist. Good dentition. No lesions. Heart: Normal rate and rhythm, s1 and s2 heart sounds present.  Lungs: Clear lung sounds in all lobes. Respirations equal and unlabored. Abdomen:  +BS, soft, and non-distended. No rebound or guarding. No HSM or masses noted. TTP of Right/mid/lateral abdomen Derm: No palmar erythema or jaundice Msk:  Symmetrical without gross deformities. Normal posture. Extremities:  Without edema. Neurologic:  Alert and  oriented x4 Psych:  Alert and cooperative. Normal mood and affect.  ASSESSMENT: Sarah Chapman is a 60 y.o. female presenting today for follow up of GERD. She is also  having constipation and abdominal pain with associated bloating.   She has been doing well on dexilant, however, reports it is very expensive and would like to try another PPI that is cheaper. She reports dexilant is the only PPI she has been on. We will try Protonix 40mg  daily. No dysphagia or odynophagia.   Constipation has been ongoing. She takes Norco daily. States that she has a BM once a day only when she takes laxatives (ex lax) daily. Taking up to 6 laxatives in a 48 hour period, sometimes also takes a stool softener. Suspect constipation is opiate induced. Discussed trying Linzess 290 mcg samples to see if she can decrease need for laxatives and have more regular bowel movements.  She reports Right/mid/lateral abdominal pain with associated bloating that occurs 3-4x/week. She feels that pain decreases if she has a good bowel movement, however, she rarely feels like she empties her bowels completely. Upon examination, patient has significant TTP of R mid abdomen. Will proceed with CT abdomen w/ contrast to be done urgently, cannot rule out obstruction or appendicitis. She denies nausea, vomiting, diarrhea, black or bloody stools, hematemesis.   PLAN:  CT abdomen with contrast to be done as soon as possible Protonix 40mg  daily in place of dexilant for GERD 2.Provided samples of Linzess  273mcg for suspected opiate induced constipation, will Rx if this provides good results for her, Movantik or amitiza if linzess does not work 3. Drink plenty of water to keep urine pale yellow to clear 4. Continue to eat diet high in fruits and veggies.   Follow Up: 3 Months  Chelsea L. Alver Sorrow, MSN, APRN, AGNP-C Adult-Gerontology Nurse Practitioner [facility]  Visit was done in conjunction with Scherrie Gerlach, MSN, APRN, AGNP-C. Agree with above. Annitta Needs, PhD, ANP-BC [facility]

## 2020-11-17 NOTE — Telephone Encounter (Signed)
LMTC x 1  

## 2020-11-17 NOTE — Patient Instructions (Signed)
Will schedule CT of you abdomen to be done as soon as possible.  We will try samples of linzess to see if this helps your constipation, if you have good results we can send an Rx, if you feel that it does not help your symptoms, we can try movantek or amitiza.

## 2020-11-17 NOTE — Telephone Encounter (Signed)
Pt informed of CT results per Sarah Groce, NP.  PT verbalized understanding.  Copy of CT sent to PCP.  Order placed for 3 mth f/u CT.  

## 2020-11-18 ENCOUNTER — Telehealth: Payer: Self-pay

## 2020-11-18 NOTE — Telephone Encounter (Signed)
PA for CT abd/pelvis w/contrast submitted yesterday via [facility]. PA# [id number], valid 11/17/20-05/16/21.  ASAP CT scheduled for 11/23/20 at 1:30pm, arrive at 1:00pm. NPO 4 hours prior to test. Pickup contrast before day of test.  Called and informed pt of CT appt.

## 2020-11-22 DIAGNOSIS — M7918 Myalgia, other site: Secondary | ICD-10-CM | POA: Diagnosis not present

## 2020-11-22 DIAGNOSIS — M546 Pain in thoracic spine: Secondary | ICD-10-CM | POA: Diagnosis not present

## 2020-11-22 DIAGNOSIS — G894 Chronic pain syndrome: Secondary | ICD-10-CM | POA: Diagnosis not present

## 2020-11-22 DIAGNOSIS — M5136 Other intervertebral disc degeneration, lumbar region: Secondary | ICD-10-CM | POA: Diagnosis not present

## 2020-11-23 ENCOUNTER — Ambulatory Visit (HOSPITAL_COMMUNITY)
Admission: RE | Admit: 2020-11-23 | Discharge: 2020-11-23 | Disposition: A | Discharge status: Home / Self Care | Payer: Medicare HMO | Source: Ambulatory Visit | Attending: Gastroenterology | Admitting: Gastroenterology

## 2020-11-23 ENCOUNTER — Other Ambulatory Visit: Payer: Self-pay

## 2020-11-23 ENCOUNTER — Encounter (HOSPITAL_COMMUNITY): Payer: Self-pay | Admitting: Radiology

## 2020-11-23 DIAGNOSIS — R109 Unspecified abdominal pain: Secondary | ICD-10-CM | POA: Diagnosis not present

## 2020-11-23 DIAGNOSIS — R1031 Right lower quadrant pain: Secondary | ICD-10-CM | POA: Diagnosis not present

## 2020-11-23 LAB — POCT I-STAT CREATININE: Creatinine, Ser: 0.8 mg/dL (ref 0.44–1.00)

## 2020-11-23 MED ORDER — IOHEXOL 300 MG/ML  SOLN
100.0000 mL | Freq: Once | INTRAMUSCULAR | Status: AC | PRN
  Administered 2020-11-23: 100 mL via INTRAVENOUS

## 2020-11-25 ENCOUNTER — Telehealth: Payer: Self-pay | Admitting: Internal Medicine

## 2020-11-25 NOTE — Telephone Encounter (Signed)
See result note.  

## 2020-11-25 NOTE — Telephone Encounter (Signed)
Pt had CT done on 6/28 and was calling for results. Please call [phone]

## 2020-12-01 ENCOUNTER — Other Ambulatory Visit: Payer: Self-pay

## 2020-12-01 ENCOUNTER — Other Ambulatory Visit: Payer: Self-pay | Admitting: Gastroenterology

## 2020-12-01 ENCOUNTER — Telehealth: Payer: Self-pay | Admitting: Gastroenterology

## 2020-12-01 ENCOUNTER — Ambulatory Visit (INDEPENDENT_AMBULATORY_CARE_PROVIDER_SITE_OTHER): Payer: Medicare HMO | Admitting: Nurse Practitioner

## 2020-12-01 VITALS — BP 165/101 | HR 93 | Temp 97.3°F | Ht 65.0 in | Wt 168.0 lb

## 2020-12-01 DIAGNOSIS — L239 Allergic contact dermatitis, unspecified cause: Secondary | ICD-10-CM | POA: Diagnosis not present

## 2020-12-01 MED ORDER — PREDNISONE 10 MG (21) PO TBPK: ORAL_TABLET | ORAL | 0 refills | Status: DC

## 2020-12-01 MED ORDER — HYDROCORTISONE 0.5 % EX CREA: 1.0000 "application " | TOPICAL_CREAM | Freq: Two times a day (BID) | CUTANEOUS | 0 refills | Status: DC

## 2020-12-01 MED ORDER — LUBIPROSTONE 24 MCG PO CAPS: 24.0000 ug | ORAL_CAPSULE | Freq: Two times a day (BID) | ORAL | 3 refills | Status: DC

## 2020-12-01 MED ORDER — METHYLPREDNISOLONE ACETATE 40 MG/ML IJ SUSP
80.0000 mg | Freq: Once | INTRAMUSCULAR | Status: AC
Start: 2020-12-01 — End: 2020-12-01
  Administered 2020-12-01: 80 mg via INTRAMUSCULAR

## 2020-12-01 NOTE — Telephone Encounter (Signed)
[phone] patient called to speak to a nurse,  called last week and has not heard back from here

## 2020-12-01 NOTE — Patient Instructions (Addendum)
Sarah Chapman contact Dermatitis Dermatitis is redness, soreness, and swelling (inflammation) of the skin. Contact dermatitis is a reaction to something that touches theskin. There are two types of contact dermatitis: Irritant contact dermatitis. This happens when something bothers (irritates) your skin, like soap. Allergic contact dermatitis. This is caused when you are exposed to something that you are allergic to, such as poison ivy. What are the causes? Common causes of irritant contact dermatitis include: Makeup. Soaps. Detergents. Bleaches. Acids. Metals, such as nickel. Common causes of allergic contact dermatitis include: Plants. Chemicals. Jewelry. Latex. Medicines. Preservatives in products, such as clothing. What increases the risk? Having a job that exposes you to things that bother your skin. Having asthma or eczema. What are the signs or symptoms? Symptoms may happen anywhere the irritant has touched your skin. Symptoms include: Dry or flaky skin. Redness. Cracks. Itching. Pain or a burning feeling. Blisters. Blood or clear fluid draining from skin cracks. With allergic contact dermatitis, swelling may occur. This may happen in placessuch as the eyelids, mouth, or genitals. How is this treated? This condition is treated by checking for the cause of the reaction and protecting your skin. Treatment may also include: Steroid creams, ointments, or medicines. Antibiotic medicines or other ointments, if you have a skin infection. Lotion or medicines to help with itching. A bandage (dressing). Follow these instructions at home: Skin care Moisturize your skin as needed. Put cool cloths on your skin. Put a baking soda paste on your skin. Stir water into baking soda until it looks like a paste. Do not scratch your skin. Avoid having things rub up against your skin. Avoid the use of soaps, perfumes, and dyes. Medicines Take or apply over-the-counter and prescription  medicines only as told by your doctor. If you were prescribed an antibiotic medicine, take or apply it as told by your doctor. Do not stop using it even if your condition starts to get better. Bathing Take a bath with: Epsom salts. Baking soda. Colloidal oatmeal. Bathe less often. Bathe in warm water. Avoid using hot water. Bandage care If you were given a bandage, change it as told by your health care provider. Wash your hands with soap and water before and after you change your bandage. If soap and water are not available, use hand sanitizer. General instructions Avoid the things that caused your reaction. If you do not know what caused it, keep a journal. Write down: What you eat. What skin products you use. What you drink. What you wear in the area that has symptoms. This includes jewelry. Check the affected areas every day for signs of infection. Check for: More redness, swelling, or pain. More fluid or blood. Warmth. Pus or a bad smell. Keep all follow-up visits as told by your doctor. This is important. Contact a doctor if: You do not get better with treatment. Your condition gets worse. You have signs of infection, such as: More swelling. Tenderness. More redness. Soreness. Warmth. You have a fever. You have new symptoms. Get help right away if: You have a very bad headache. You have neck pain. Your neck is stiff. You throw up (vomit). You feel very sleepy. You see red streaks coming from the area. Your bone or joint near the area hurts after the skin has healed. The area turns darker. You have trouble breathing. Summary Dermatitis is redness, soreness, and swelling of the skin. Symptoms may occur where the irritant has touched you. Treatment may include medicines and skin care. If you do  not know what caused your reaction, keep a journal. Contact a doctor if your condition gets worse or you have signs of infection. This information is not intended to  replace advice given to you by your health care provider. Make sure you discuss any questions you have with your healthcare provider. Document Revised: 09/04/2018 Document Reviewed: 11/28/2017 Elsevier Patient Education  [place].

## 2020-12-01 NOTE — Progress Notes (Signed)
Acute Office Visit  Subjective:    Patient ID: Sarah Chapman, female    DOB: Jan 12, 1961, 60 y.o.   MRN: [id number]  Chief Complaint  Patient presents with   Rash    Rash This is a new problem. Episode onset: The past 4 days. The problem has been gradually worsening since onset. The affected locations include the left upper leg and right upper leg. The rash is characterized by dryness, redness and itchiness. It is unknown if there was an exposure to a precipitant. Pertinent negatives include no anorexia, congestion, cough, facial edema, fatigue, fever, shortness of breath or sore throat. Past treatments include antihistamine and anti-itch cream. The treatment provided no relief. Her past medical history is significant for allergies.    Past Medical History:  Diagnosis Date   Allergic rhinitis    Back pain    Chronic headache    COPD (chronic obstructive pulmonary disease) (HCC)    Emphysema of lung (HCC)    Hyperlipidemia    Hypertension    Neck pain    Sleep apnea    Vaginal Pap smear, abnormal     Past Surgical History:  Procedure Laterality Date   CESAREAN SECTION     COLONOSCOPY N/A 01/07/2015   internal hemorrhoids, diverticulosis in sigmoid colon, exam otherwise normal, no biopsies.   GYNECOLOGIC CRYOSURGERY     NECK SURGERY     TONSILLECTOMY      Family History  Problem Relation Age of Onset   Bone cancer Mother    Colon cancer Brother 38   Heart attack Brother    Cancer Maternal Grandmother    Mental illness Son     Social History   Socioeconomic History   Marital status: Divorced    Spouse name: Not on file   Number of children: Not on file   Years of education: Not on file   Highest education level: Not on file  Occupational History   Not on file  Tobacco Use   Smoking status: Former    Packs/day: 1.00    Years: 33.00    Pack years: 33.00    Types: Cigarettes    Quit date: 07/25/2016    Years since quitting: 4.3   Smokeless tobacco: Never   Vaping Use   Vaping Use: Never used  Substance and Sexual Activity   Alcohol use: No   Drug use: No   Sexual activity: Not Currently    Birth control/protection: Post-menopausal  Other Topics Concern   Not on file  Social History Narrative   Not on file   Social Determinants of Health   Financial Resource Strain: Not on file  Food Insecurity: Not on file  Transportation Needs: Not on file  Physical Activity: Not on file  Stress: Not on file  Social Connections: Not on file  Intimate Partner Violence: Not on file    Outpatient Medications Prior to Visit  Medication Sig Dispense Refill   albuterol (VENTOLIN HFA) 108 (90 Base) MCG/ACT inhaler INHALE 2 PUFFS EVERY 6 HOURS AS NEEDED FOR WHEEZING OR SHORTNESS OF BREATH 18 g 3   ALPRAZolam (XANAX) 0.5 MG tablet Take 0.5 mg by mouth at bedtime as needed for anxiety.     Armodafinil 250 MG tablet Take 250 mg by mouth daily.     aspirin 81 MG EC tablet Take 1 tablet by mouth daily.     buPROPion (WELLBUTRIN XL) 150 MG 24 hr tablet Take 450 mg by mouth daily.  Dermatological Products, Misc. (KERASAL FUNGAL NAIL RENEWAL) SOLN Apply 1 application topically 2 (two) times daily. 10 mL 5   Efinaconazole 10 % SOLN Apply 1 application topically daily. 8 mL 1   Fluticasone-Umeclidin-Vilant (TRELEGY ELLIPTA) 200-62.5-25 MCG/INH AEPB Inhale 1 puff into the lungs daily. 60 each 5   HYDROcodone-acetaminophen (NORCO) 10-325 MG tablet Take 1 tablet by mouth 4 (four) times daily as needed.     nortriptyline (PAMELOR) 50 MG capsule Take 50 mg by mouth at bedtime.     olmesartan (BENICAR) 40 MG tablet Take 1 tablet (40 mg total) by mouth daily. 100 tablet 1   pantoprazole (PROTONIX) 40 MG tablet Take 1 tablet (40 mg total) by mouth daily. 90 tablet 3   tizanidine (ZANAFLEX) 2 MG capsule Take 2 mg by mouth 3 (three) times daily.     Ubrogepant (UBRELVY PO) Take by mouth.     atorvastatin (LIPITOR) 80 MG tablet Take 1 tablet (80 mg total) by mouth  daily. 90 tablet 3   ezetimibe (ZETIA) 10 MG tablet Take 1 tablet (10 mg total) by mouth daily. 90 tablet 3   No facility-administered medications prior to visit.    Allergies  Allergen Reactions   Clindamycin/Lincomycin Rash   Penicillins     Unknown     Review of Systems  Constitutional: Negative.  Negative for fatigue and fever.  HENT:  Negative for congestion and sore throat.   Respiratory: Negative.  Negative for cough and shortness of breath.   Gastrointestinal: Negative.  Negative for anorexia.  Genitourinary: Negative.   Skin:  Positive for rash.  All other systems reviewed and are negative.     Objective:    Physical Exam Vitals and nursing note reviewed.  Constitutional:      Appearance: Normal appearance.  HENT:     Head: Normocephalic.     Nose: Nose normal.  Eyes:     Conjunctiva/sclera: Conjunctivae normal.  Cardiovascular:     Rate and Rhythm: Normal rate and regular rhythm.     Pulses: Normal pulses.     Heart sounds: Normal heart sounds.  Pulmonary:     Effort: Pulmonary effort is normal.     Breath sounds: Normal breath sounds.  Abdominal:     General: Bowel sounds are normal.  Skin:    Findings: Erythema and rash present. Rash is urticarial.  Neurological:     Mental Status: She is alert and oriented to person, place, and time.  Leg wound and wound healing much CT scan  BP (!) 165/101   Pulse 93   Temp (!) 97.3 F (36.3 C) (Temporal)   Ht 5\' 5"  (1.651 m)   Wt 168 lb (76.2 kg)   SpO2 96%   BMI 27.96 kg/m  Wt Readings from Last 3 Encounters:  12/01/20 168 lb (76.2 kg)  11/17/20 163 lb 6.4 oz (74.1 kg)  11/02/20 167 lb (75.8 kg)    Health Maintenance Due  Topic Date Due   Pneumococcal Vaccine 20-20 Years old (1 - PCV) Never done   Hepatitis C Screening  Never done   MAMMOGRAM  06/23/2019   COVID-19 Vaccine (3 - Moderna risk series) 10/20/2019   PAP SMEAR-Modifier  11/06/2020    There are no preventive care reminders to display  for this patient.   Lab Results  Component Value Date   TSH 2.440 07/19/2016   Lab Results  Component Value Date   WBC 7.4 07/06/2020   HGB 12.5 07/06/2020   HCT 37.4  07/06/2020   MCV 93 07/06/2020   PLT 376 07/06/2020   Lab Results  Component Value Date   NA 143 07/06/2020   K 4.2 07/06/2020   CO2 26 07/06/2020   GLUCOSE 86 07/06/2020   BUN 14 07/06/2020   CREATININE 0.80 11/23/2020   BILITOT 0.2 07/06/2020   ALKPHOS 128 (H) 07/06/2020   AST 18 07/06/2020   ALT 27 07/06/2020   PROT 6.9 07/06/2020   ALBUMIN 4.6 07/06/2020   CALCIUM 9.5 07/06/2020   Lab Results  Component Value Date   CHOL 235 (H) 07/06/2020   Lab Results  Component Value Date   HDL 111 07/06/2020   Lab Results  Component Value Date   LDLCALC 108 (H) 07/06/2020   Lab Results  Component Value Date   TRIG 96 07/06/2020   Lab Results  Component Value Date   CHOLHDL 2.1 07/06/2020   No results found for: HGBA1C     Assessment & Plan:   Problem List Items Addressed This Visit       Musculoskeletal and Integument   Allergic contact dermatitis - Primary    Symptoms of worsening pruritus from contact dermatitis unknown source multiple managed.  Prednisone taper, Depo-Medrol 80 mg given in clinic.  Advised patient to avoid scratching skin, cool compress, cool showers, moisturizer.  1% hydrocortisone cream.  Follow-up with worsening hours of symptoms.       Relevant Medications   predniSONE (STERAPRED UNI-PAK 21 TAB) 10 MG (21) TBPK tablet   hydrocortisone cream 0.5 %     Meds ordered this encounter  Medications   predniSONE (STERAPRED UNI-PAK 21 TAB) 10 MG (21) TBPK tablet    Sig: 6 tablet day 1, 5 tablet between, 4 tablet day 3, 3 tablet day 4, 2 tablet day five 1 tablet day 6    Dispense:  1 each    Refill:  0    Order Specific Question:   Supervising Provider    Answer:   Janora Norlander [[id number]]   methylPREDNISolone acetate (DEPO-MEDROL) injection 80 mg   hydrocortisone  cream 0.5 %    Sig: Apply 1 application topically 2 (two) times daily.    Dispense:  30 g    Refill:  0    Order Specific Question:   Supervising Provider    Answer:   Janora Norlander [[id number]]      Ivy Lynn, NP

## 2020-12-01 NOTE — Telephone Encounter (Signed)
Spoke with pt and she had a comment to send to [place] in which at that time I routed to [place].

## 2020-12-01 NOTE — Telephone Encounter (Signed)
Phoned and advised the pt on her result note Roseanne Kaufman sent in Amitiza 24 mcg to take 1 pill po x 2 a day. Stop the Linzess. Pt states she can do that.

## 2020-12-01 NOTE — Assessment & Plan Note (Signed)
Symptoms of worsening pruritus from contact dermatitis unknown source multiple managed.  Prednisone taper, Depo-Medrol 80 mg given in clinic.  Advised patient to avoid scratching skin, cool compress, cool showers, moisturizer.  1% hydrocortisone cream.  Follow-up with worsening hours of symptoms.

## 2020-12-01 NOTE — Telephone Encounter (Signed)
See result note. I have sent in Amitiza 24 mcg po BID. Stop Linzess.

## 2020-12-01 NOTE — Telephone Encounter (Signed)
Already addressed

## 2020-12-01 NOTE — Telephone Encounter (Signed)
Pt called back today have you had a chance to look at the result note I sent you back on her? The note is in her result note from 06-29/06-30.

## 2020-12-02 ENCOUNTER — Telehealth: Payer: Self-pay

## 2020-12-02 ENCOUNTER — Telehealth: Payer: Self-pay | Admitting: *Deleted

## 2020-12-02 DIAGNOSIS — L239 Allergic contact dermatitis, unspecified cause: Secondary | ICD-10-CM

## 2020-12-02 MED ORDER — PREDNISONE 10 MG (21) PO TBPK: ORAL_TABLET | ORAL | 0 refills | Status: DC

## 2020-12-02 NOTE — Telephone Encounter (Signed)
Pt approved for Lubiprostone 66mcg capsule from 05/29/2020 through 05/28/2021. This is a non-formulary. Pt mus remain enrolled in [employer] Medicare Part D prescription drug plan. Will give to Manuela Schwartz to scan in pt's chart.

## 2020-12-02 NOTE — Telephone Encounter (Signed)
Rx for prednisone resent to pharmacy for patient as the pharmacy did not receive yesterday.

## 2020-12-05 DIAGNOSIS — G4731 Primary central sleep apnea: Secondary | ICD-10-CM | POA: Diagnosis not present

## 2020-12-09 DIAGNOSIS — I6782 Cerebral ischemia: Secondary | ICD-10-CM | POA: Diagnosis not present

## 2020-12-09 DIAGNOSIS — G518 Other disorders of facial nerve: Secondary | ICD-10-CM | POA: Diagnosis not present

## 2020-12-09 DIAGNOSIS — H02403 Unspecified ptosis of bilateral eyelids: Secondary | ICD-10-CM | POA: Diagnosis not present

## 2020-12-09 DIAGNOSIS — G43719 Chronic migraine without aura, intractable, without status migrainosus: Secondary | ICD-10-CM | POA: Diagnosis not present

## 2020-12-21 DIAGNOSIS — F900 Attention-deficit hyperactivity disorder, predominantly inattentive type: Secondary | ICD-10-CM | POA: Diagnosis not present

## 2020-12-21 DIAGNOSIS — R69 Illness, unspecified: Secondary | ICD-10-CM | POA: Diagnosis not present

## 2020-12-21 DIAGNOSIS — F411 Generalized anxiety disorder: Secondary | ICD-10-CM | POA: Diagnosis not present

## 2020-12-21 DIAGNOSIS — G8921 Chronic pain due to trauma: Secondary | ICD-10-CM | POA: Diagnosis not present

## 2020-12-23 ENCOUNTER — Telehealth: Payer: Self-pay | Admitting: Internal Medicine

## 2020-12-23 MED ORDER — NALOXEGOL OXALATE 25 MG PO TABS: 25.0000 mg | ORAL_TABLET | Freq: Every day | ORAL | 5 refills | Status: DC

## 2020-12-23 NOTE — Addendum Note (Signed)
Addended by: Annitta Needs on: 12/23/2020 06:15 PM   Modules accepted: Orders

## 2020-12-23 NOTE — Telephone Encounter (Signed)
I returned the pt's call and she stated the Amitiza 24 mcg not working. I asked her how she was taking it and she advised she was taking 2 in the morning and 2 in the afternoon. She also stated over the weekend she took 2 enema's and drank a bottle of mag citrate. States all that happened was from the enema's liquid came back out (which may not have been held in long enough). I asked her is she eating and drinking enough and she stated yes. I just got this approved for her on the 7th of this month. She has been on Linzess as well. She states she takes 4 hydrocodone throughout the day and that may be the cause. I told the pt that I have to see what the Dr recommends now but her instructions for Amitiza is 1 in the am and 1 in the evening. Please advise. I sent to you because Magda Paganini hasn't seen this pt before

## 2020-12-23 NOTE — Telephone Encounter (Signed)
Pt said the laxative she's on isn't helping and could we call in a different kind to [facility]. [phone]

## 2020-12-23 NOTE — Telephone Encounter (Signed)
Can we get more info?   How often having a BM? (Daily, every other day, etc) Is she straining Is she having small stool, hard pebbles, etc?  Definitely overdosing on the Amitiza. Needs to only do twice a day with food.   We need to try Movantik at this point. I have sent in a prescription. If we have samples, that would be great. She will need to take this one hour before eating or 2 hours after.   Let us know how she is doing on Movantik. We can add back Amitiza to it if needed.

## 2020-12-24 NOTE — Telephone Encounter (Signed)
Pt called back [facility] stating that the pharmacy will not have it until Monday. So I advised the pt to head this way for her samples of Movantik to take over the weekend and she agreed.

## 2020-12-24 NOTE — Telephone Encounter (Signed)
Phoned and spoke with the pt, she is having a bowel movement sometimes every 4 days. She had a partial bowel movement yesterday morning. Yes the pt is having to strain but its soft and she still has a hard time pushing it out. Advised of recommendations for Amitiza. Advised of recommendations for Movantik and that you sent in Rx. Directions were given to the pt. Pt advised to get back with [place] next week on how she is doing with the Movantik and if need be we can add back the Amitiza. Pt will call back today if before closing regarding if her Rx needs a PA if so then she will get samples.

## 2020-12-30 DIAGNOSIS — M5136 Other intervertebral disc degeneration, lumbar region: Secondary | ICD-10-CM | POA: Diagnosis not present

## 2020-12-30 DIAGNOSIS — M7918 Myalgia, other site: Secondary | ICD-10-CM | POA: Diagnosis not present

## 2020-12-30 DIAGNOSIS — M961 Postlaminectomy syndrome, not elsewhere classified: Secondary | ICD-10-CM | POA: Diagnosis not present

## 2020-12-30 DIAGNOSIS — G894 Chronic pain syndrome: Secondary | ICD-10-CM | POA: Diagnosis not present

## 2020-12-31 ENCOUNTER — Other Ambulatory Visit: Payer: Self-pay | Admitting: Pulmonary Disease

## 2021-01-05 DIAGNOSIS — G4731 Primary central sleep apnea: Secondary | ICD-10-CM | POA: Diagnosis not present

## 2021-01-11 ENCOUNTER — Ambulatory Visit: Payer: Medicare HMO | Admitting: Family Medicine

## 2021-01-12 ENCOUNTER — Encounter: Payer: Self-pay | Admitting: Family Medicine

## 2021-01-17 ENCOUNTER — Other Ambulatory Visit: Payer: Medicare HMO

## 2021-01-17 ENCOUNTER — Other Ambulatory Visit: Payer: Self-pay

## 2021-01-17 DIAGNOSIS — I10 Essential (primary) hypertension: Secondary | ICD-10-CM | POA: Diagnosis not present

## 2021-01-17 DIAGNOSIS — E785 Hyperlipidemia, unspecified: Secondary | ICD-10-CM | POA: Diagnosis not present

## 2021-01-18 DIAGNOSIS — R69 Illness, unspecified: Secondary | ICD-10-CM | POA: Diagnosis not present

## 2021-01-18 DIAGNOSIS — G4731 Primary central sleep apnea: Secondary | ICD-10-CM | POA: Diagnosis not present

## 2021-01-18 LAB — HEPATIC FUNCTION PANEL
ALT: 26 IU/L (ref 0–32)
AST: 22 IU/L (ref 0–40)
Albumin: 4.4 g/dL (ref 3.8–4.8)
Alkaline Phosphatase: 118 IU/L (ref 44–121)
Bilirubin Total: <0.2 mg/dL (ref 0.0–1.2)
Bilirubin, Direct: <0.1 mg/dL (ref 0.00–0.40)
Total Protein: 6.8 g/dL (ref 6.0–8.5)

## 2021-01-18 LAB — LIPID PANEL
Chol/HDL Ratio: 2.1 ratio (ref 0.0–4.4)
Cholesterol, Total: 219 mg/dL — ABNORMAL HIGH (ref 100–199)
HDL: 102 mg/dL (ref >39)
LDL Chol Calc (NIH): 99 mg/dL (ref 0–99)
Triglycerides: 105 mg/dL (ref 0–149)
VLDL Cholesterol Cal: 18 mg/dL (ref 5–40)

## 2021-01-24 ENCOUNTER — Ambulatory Visit: Payer: Medicare HMO | Admitting: Family Medicine

## 2021-01-24 DIAGNOSIS — H40003 Preglaucoma, unspecified, bilateral: Secondary | ICD-10-CM | POA: Diagnosis not present

## 2021-01-25 DIAGNOSIS — M7918 Myalgia, other site: Secondary | ICD-10-CM | POA: Diagnosis not present

## 2021-01-26 ENCOUNTER — Telehealth: Payer: Self-pay

## 2021-01-26 NOTE — Telephone Encounter (Addendum)
Left voicemail for patient to call office patient will need to be scheduled to pharmacy in regards to bi weekly injectable .----- Message from Warren Lacy, PA-C sent at 01/26/2021  3:45 PM EDT ----- Your bad (LDL) cholesterol remains >70.  Currently, it is 99.  With your known moderate coronary artery disease and carotid stenosis, I recommend referral to the lipid pharmacist to discuss [place] and Praluent (an injectable medicine every 2 weeks).  Colletta [place] - if the patient is okay with this, can you arrange an appointment?  Thank you! Clifton Custard PA-C

## 2021-01-27 ENCOUNTER — Other Ambulatory Visit: Payer: Self-pay | Admitting: Family Medicine

## 2021-01-27 DIAGNOSIS — I10 Essential (primary) hypertension: Secondary | ICD-10-CM

## 2021-02-04 DIAGNOSIS — G4731 Primary central sleep apnea: Secondary | ICD-10-CM | POA: Diagnosis not present

## 2021-02-05 DIAGNOSIS — G4731 Primary central sleep apnea: Secondary | ICD-10-CM | POA: Diagnosis not present

## 2021-02-07 ENCOUNTER — Ambulatory Visit (INDEPENDENT_AMBULATORY_CARE_PROVIDER_SITE_OTHER): Payer: Medicare HMO | Admitting: Nurse Practitioner

## 2021-02-07 ENCOUNTER — Other Ambulatory Visit: Payer: Self-pay

## 2021-02-07 ENCOUNTER — Telehealth: Payer: Self-pay | Admitting: Family Medicine

## 2021-02-07 ENCOUNTER — Encounter: Payer: Self-pay | Admitting: Nurse Practitioner

## 2021-02-07 VITALS — BP 117/73 | HR 86 | Temp 97.4°F | Resp 20 | Ht 65.0 in | Wt 168.0 lb

## 2021-02-07 DIAGNOSIS — R42 Dizziness and giddiness: Secondary | ICD-10-CM

## 2021-02-07 NOTE — Progress Notes (Signed)
   Subjective:    Patient ID: Sarah Chapman, female    DOB: [birth year], 60 y.o.   MRN: [id number]  Chief Complaint: BP has been low   HPI Patient comes in stating that she has been "foggy  headed" for the last few weeks. Has been intermittent. Like she is light headed. She had an e on diet and cut sugar out of diet and ws not sure if that was cause.     Review of Systems  Constitutional: Negative.   Respiratory: Negative.    Cardiovascular: Negative.   Genitourinary: Negative.   Neurological:  Positive for dizziness and weakness.  All other systems reviewed and are negative.     Objective:   Physical Exam Constitutional:      Appearance: Normal appearance.  Cardiovascular:     Rate and Rhythm: Normal rate and regular rhythm.     Heart sounds: Normal heart sounds.  Pulmonary:     Effort: Pulmonary effort is normal.     Breath sounds: Normal breath sounds.  Skin:    General: Skin is warm.  Neurological:     General: No focal deficit present.     Mental Status: She is alert and oriented to person, place, and time.    BP 117/73   Pulse 86   Temp (!) 97.4 F (36.3 C) (Temporal)   Resp 20   Ht $R'5\' 5"'np$  (1.651 m)   Wt 168 lb (76.2 kg)   SpO2 98%   BMI 27.96 kg/m        Assessment & Plan:  Sarah Chapman in today with chief complaint of BP has been low   1. Light-headed feeling Try taking 1/2 benicar daily Keep diary of blood pressure Labs pending Watch diet Keep follow up appointment with DR. Stacks. - CBC with Differential/Platelet - [id number]    The above assessment and management plan was discussed with the patient. The patient verbalized understanding of and has agreed to the management plan. Patient is aware to call the clinic if symptoms persist or worsen. Patient is aware when to return to the clinic for a follow-up visit. Patient educated on when it is appropriate to go to the emergency department.   Mary-Margaret Hassell Done, FNP

## 2021-02-07 NOTE — Patient Instructions (Signed)

## 2021-02-07 NOTE — Telephone Encounter (Signed)
Patient reports that she has had some episodes of hypotension beginning yesterday and it is feeling dizzy and light headed.  She has already taken her blood pressure medication today.  She checked this morning and her readings have been 132/87, 123/57, and then 89/69.  Appointment scheduled with Shelah Lewandowsky today.

## 2021-02-08 LAB — CBC WITH DIFFERENTIAL/PLATELET
Basophils Absolute: 0.1 10*3/uL (ref 0.0–0.2)
Basos: 1 %
EOS (ABSOLUTE): 0.1 10*3/uL (ref 0.0–0.4)
Eos: 1 %
Hematocrit: 37.5 % (ref 34.0–46.6)
Hemoglobin: 13 g/dL (ref 11.1–15.9)
Immature Grans (Abs): 0 10*3/uL (ref 0.0–0.1)
Immature Granulocytes: 0 %
Lymphocytes Absolute: 2.1 10*3/uL (ref 0.7–3.1)
Lymphs: 18 %
MCH: 31 pg (ref 26.6–33.0)
MCHC: 34.7 g/dL (ref 31.5–35.7)
MCV: 89 fL (ref 79–97)
Monocytes Absolute: 0.8 10*3/uL (ref 0.1–0.9)
Monocytes: 7 %
Neutrophils Absolute: 8.3 10*3/uL — ABNORMAL HIGH (ref 1.4–7.0)
Neutrophils: 73 %
Platelets: 429 10*3/uL (ref 150–450)
RBC: 4.2 x10E6/uL (ref 3.77–5.28)
RDW: 13.4 % (ref 11.7–15.4)
WBC: 11.3 10*3/uL — ABNORMAL HIGH (ref 3.4–10.8)

## 2021-02-08 LAB — CMP14+EGFR
ALT: 26 IU/L (ref 0–32)
AST: 20 IU/L (ref 0–40)
Albumin/Globulin Ratio: 2 (ref 1.2–2.2)
Albumin: 4.5 g/dL (ref 3.8–4.8)
Alkaline Phosphatase: 112 IU/L (ref 44–121)
BUN/Creatinine Ratio: 14 (ref 12–28)
BUN: 17 mg/dL (ref 8–27)
Bilirubin Total: <0.2 mg/dL (ref 0.0–1.2)
CO2: 23 mmol/L (ref 20–29)
Calcium: 9.6 mg/dL (ref 8.7–10.3)
Chloride: 98 mmol/L (ref 96–106)
Creatinine, Ser: 1.23 mg/dL — ABNORMAL HIGH (ref 0.57–1.00)
Globulin, Total: 2.3 g/dL (ref 1.5–4.5)
Glucose: 127 mg/dL — ABNORMAL HIGH (ref 65–99)
Potassium: 4.7 mmol/L (ref 3.5–5.2)
Sodium: 136 mmol/L (ref 134–144)
Total Protein: 6.8 g/dL (ref 6.0–8.5)
eGFR: 50 mL/min/{1.73_m2} — ABNORMAL LOW (ref >59)

## 2021-02-14 ENCOUNTER — Other Ambulatory Visit: Payer: Self-pay

## 2021-02-14 ENCOUNTER — Ambulatory Visit (HOSPITAL_COMMUNITY)
Admission: RE | Admit: 2021-02-14 | Discharge: 2021-02-14 | Disposition: A | Discharge status: Home / Self Care | Payer: Medicare HMO | Source: Ambulatory Visit | Attending: Acute Care | Admitting: Acute Care

## 2021-02-14 DIAGNOSIS — Z87891 Personal history of nicotine dependence: Secondary | ICD-10-CM | POA: Diagnosis not present

## 2021-02-14 DIAGNOSIS — I700 Atherosclerosis of aorta: Secondary | ICD-10-CM | POA: Diagnosis not present

## 2021-02-14 DIAGNOSIS — J439 Emphysema, unspecified: Secondary | ICD-10-CM | POA: Diagnosis not present

## 2021-02-14 DIAGNOSIS — J479 Bronchiectasis, uncomplicated: Secondary | ICD-10-CM | POA: Diagnosis not present

## 2021-02-15 ENCOUNTER — Ambulatory Visit (INDEPENDENT_AMBULATORY_CARE_PROVIDER_SITE_OTHER): Payer: Medicare HMO | Admitting: Family Medicine

## 2021-02-15 ENCOUNTER — Encounter: Payer: Self-pay | Admitting: Family Medicine

## 2021-02-15 DIAGNOSIS — J432 Centrilobular emphysema: Secondary | ICD-10-CM

## 2021-02-15 DIAGNOSIS — U071 COVID-19: Secondary | ICD-10-CM

## 2021-02-15 MED ORDER — MOLNUPIRAVIR EUA 200MG CAPSULE: 4.0000 | ORAL_CAPSULE | Freq: Two times a day (BID) | ORAL | 0 refills | Status: AC

## 2021-02-15 NOTE — Progress Notes (Signed)
Subjective:    Patient ID: Sarah Chapman, female    DOB: 07-04-1960, 60 y.o.   MRN: [id number]   HPI: Sarah Chapman is a 60 y.o. female presenting for Covid positive last night. CXR yesterday. Head hurts, throat hurts, Congested. Ears hurt, myalgias all over. Denies fever. Dyspnea is at her COPD baseline. Taking Trelegy. HAsn't needed ProAir yet.    Depression screen [facility] 2/9 02/07/2021 12/01/2020 11/02/2020 07/06/2020 08/13/2019  Decreased Interest 1 2 0 0 3  Down, Depressed, Hopeless 1 2 1  0 1  PHQ - 2 Score 2 4 1  0 4  Altered sleeping 2 1 - - 0  Tired, decreased energy 2 3 - - 3  Change in appetite 3 2 - - 3  Feeling bad or failure about yourself  0 1 - - 0  Trouble concentrating 2 2 - - 3  Moving slowly or fidgety/restless 1 2 - - 0  Suicidal thoughts 0 0 - - 0  PHQ-9 Score 12 15 - - 13  Difficult doing work/chores Somewhat difficult Somewhat difficult - - Very difficult  Some recent data might be hidden     Relevant past medical, surgical, family and social history reviewed and updated as indicated.  Interim medical history since our last visit reviewed. Allergies and medications reviewed and updated.  ROS:  Review of Systems  Constitutional:  Positive for activity change. Negative for chills and fever.  HENT:  Positive for congestion, ear pain and sore throat. Negative for postnasal drip, rhinorrhea and trouble swallowing.   Respiratory:  Positive for cough. Negative for chest tightness and shortness of breath.   Cardiovascular:  Negative for chest pain and palpitations.  Skin:  Negative for rash.  Neurological:  Positive for headaches.    Social History   Tobacco Use  Smoking Status Former   Packs/day: 1.00   Years: 33.00   Pack years: 33.00   Types: Cigarettes   Quit date: 07/25/2016   Years since quitting: 4.5  Smokeless Tobacco Never       Objective:     Wt Readings from Last 3 Encounters:  02/07/21 168 lb (76.2 kg)  12/01/20 168 lb (76.2 kg)  11/17/20 163  lb 6.4 oz (74.1 kg)     Exam deferred. Pt. [place] due to COVID 19. Phone visit performed.   Assessment & Plan:   1. COVID-19 virus infection   2. Centrilobular emphysema ([place])     Meds ordered this encounter  Medications   molnupiravir EUA (LAGEVRIO) 200 mg CAPS capsule    Sig: Take 4 capsules (800 mg total) by mouth 2 (two) times daily for 5 days.    Dispense:  40 capsule    Refill:  0    No orders of the defined types were placed in this encounter.     Diagnoses and all orders for this visit:  COVID-19 virus infection  Centrilobular emphysema ([place])  Other orders -     molnupiravir EUA (LAGEVRIO) 200 mg CAPS capsule; Take 4 capsules (800 mg total) by mouth 2 (two) times daily for 5 days.   Virtual Visit via telephone Note  I discussed the limitations, risks, security and privacy concerns of performing an evaluation and management service by telephone and the availability of in person appointments. The patient was identified with two identifiers. Pt.expressed understanding and agreed to proceed. Pt. Is at home. Dr. Livia Snellen is in his office.  Follow Up Instructions:   I discussed the assessment and  treatment plan with the patient. The patient was provided an opportunity to ask questions and all were answered. The patient agreed with the plan and demonstrated an understanding of the instructions.   The patient was advised to call back or seek an in-person evaluation if the symptoms worsen or if the condition fails to improve as anticipated.   Total minutes including chart review and phone contact time: 13   Follow up plan: Return if symptoms worsen or fail to improve.  Claretta Fraise, MD [place]

## 2021-02-16 ENCOUNTER — Encounter: Payer: Self-pay | Admitting: *Deleted

## 2021-02-16 DIAGNOSIS — Z87891 Personal history of nicotine dependence: Secondary | ICD-10-CM

## 2021-02-17 ENCOUNTER — Telehealth: Payer: Self-pay | Admitting: *Deleted

## 2021-02-17 NOTE — Telephone Encounter (Signed)
FYI:   Pt called in concerned about her BP being low.   She has a ov on 9/12 for low BP - Benicar was decreased to 20mg  QD from 40 mg QD.   She was also seen 9/20 for COVID.   She states that her BP today was in the upper 90s /over/ 68-74.   This was about the same x 3 checks today.  While on the phone it was checked and came up to 110/74. HR was 84.  Aware that these BP readings are not TOO low and that she should continue monitoring and increase water intake.  She will keep a check on this through tomorrow afternoon and call us if any lower numbers appear.

## 2021-02-17 NOTE — Telephone Encounter (Signed)
Have her DC benicar. F/U in office in a few weeks.

## 2021-02-17 NOTE — Telephone Encounter (Signed)
LM -detailed for pt to know all info and to call back asap for 2 week appt with Stacks

## 2021-02-18 ENCOUNTER — Telehealth: Payer: Self-pay | Admitting: Family Medicine

## 2021-02-18 NOTE — Telephone Encounter (Signed)
  Prescription Request  02/18/2021  Is this a "Controlled Substance" medicine? yes Have you seen your PCP in the last 2 weeks? tuesday If YES, route message to pool  -  If NO, patient needs to be scheduled for appointment.  What is the name of the medication or equipment?pt wants codeine cough syrup/ pt has covid   Have you contacted your pharmacy to request a refill?   Which pharmacy would you like this sent to? [place]   Patient notified that their request is being sent to the clinical staff for review and that they should receive a response within 2 business days.

## 2021-02-18 NOTE — Telephone Encounter (Signed)
Please advise, patient had telephone visit with Dr. Livia Snellen on 02/15/21, Covid positive at home.

## 2021-02-21 NOTE — Telephone Encounter (Signed)
  Prescription Request  02/21/2021  Is this a "Controlled Substance" medicine? no Have you seen your PCP in the last 2 weeks? yes If YES, route message to pool  -  If NO, patient needs to be scheduled for appointment.  What is the name of the medication or equipment?needs something for cough and chest congestion/ pt has covid  Have you contacted your pharmacy to request a refill?   Which pharmacy would you like this sent to? [place]   Patient notified that their request is being sent to the clinical staff for review and that they should receive a response within 2 business days.

## 2021-02-22 NOTE — Telephone Encounter (Signed)
Pt said this is the third time she has called about this message and no answer. Stacks is off and will not address until Wed.  Please call her back today.

## 2021-02-22 NOTE — Telephone Encounter (Signed)
Pt scheduled in the [facility] 02/24/21 at 3:55. Offered appt in [place] clinic tomorrow but pt declined due to another appt.

## 2021-02-23 DIAGNOSIS — F339 Major depressive disorder, recurrent, unspecified: Secondary | ICD-10-CM | POA: Diagnosis not present

## 2021-02-23 DIAGNOSIS — G8921 Chronic pain due to trauma: Secondary | ICD-10-CM | POA: Diagnosis not present

## 2021-02-23 DIAGNOSIS — R69 Illness, unspecified: Secondary | ICD-10-CM | POA: Diagnosis not present

## 2021-02-23 DIAGNOSIS — F900 Attention-deficit hyperactivity disorder, predominantly inattentive type: Secondary | ICD-10-CM | POA: Diagnosis not present

## 2021-02-23 DIAGNOSIS — F411 Generalized anxiety disorder: Secondary | ICD-10-CM | POA: Diagnosis not present

## 2021-02-23 NOTE — Telephone Encounter (Signed)
Patient coming to appointment scheduled

## 2021-02-23 NOTE — Telephone Encounter (Signed)
If her symptoms are improving I can send in benzonatate for cough. If not improving, She needs to be seen in [place] [facility] (Scheduled for 9/29)

## 2021-02-24 ENCOUNTER — Encounter: Payer: Self-pay | Admitting: Family Medicine

## 2021-02-24 ENCOUNTER — Ambulatory Visit (INDEPENDENT_AMBULATORY_CARE_PROVIDER_SITE_OTHER): Payer: Medicare HMO | Admitting: Family Medicine

## 2021-02-24 VITALS — HR 90

## 2021-02-24 DIAGNOSIS — U071 COVID-19: Secondary | ICD-10-CM

## 2021-02-24 DIAGNOSIS — J441 Chronic obstructive pulmonary disease with (acute) exacerbation: Secondary | ICD-10-CM

## 2021-02-24 MED ORDER — CEFDINIR 300 MG PO CAPS: 300.0000 mg | ORAL_CAPSULE | Freq: Two times a day (BID) | ORAL | 0 refills | Status: DC

## 2021-02-24 MED ORDER — PROMETHAZINE-DM 6.25-15 MG/5ML PO SYRP: 5.0000 mL | ORAL_SOLUTION | Freq: Four times a day (QID) | ORAL | 1 refills | Status: DC | PRN

## 2021-02-24 MED ORDER — PREDNISONE 20 MG PO TABS: ORAL_TABLET | ORAL | 0 refills | Status: DC

## 2021-02-24 NOTE — Progress Notes (Signed)
Pulse 90   SpO2 93%    Subjective:   Patient ID: Sarah Chapman, Sarah Chapman    DOB: Nov 27, 1960, 60 y.o.   MRN: [id number]  HPI: Sarah Chapman is a 60 y.o. Sarah Chapman presenting on 02/24/2021 for URI (Symptoms started about 1.5 weeks ago. ) and Cough (Concerned about pneumonia. Positive home test last Tuesday.)   HPI Cough and chest congestion Patient is coming in for cough and chest congestion.  She started about a week and a half ago with COVID and was treated with molnupiravir and took the full course of that and was feeling better and that she is not having more body aches or sinus pressure or fevers or chills but she still has that chest congestion and she feels like it is getting deeper down into her chest where she is having a lot of coughing and some wheezing.  She denies any shortness of breath but just feels like it is getting down into her chest a little bit more.  Relevant past medical, surgical, family and social history reviewed and updated as indicated. Interim medical history since our last visit reviewed. Allergies and medications reviewed and updated.  Review of Systems  Constitutional:  Negative for chills and fever.  HENT:  Positive for congestion, postnasal drip, rhinorrhea and sinus pressure. Negative for ear discharge, ear pain, sneezing and sore throat.   Eyes:  Negative for pain, redness and visual disturbance.  Respiratory:  Positive for cough. Negative for chest tightness and shortness of breath.   Cardiovascular:  Negative for chest pain and leg swelling.  Genitourinary:  Negative for difficulty urinating and dysuria.  Musculoskeletal:  Negative for back pain and gait problem.  Skin:  Negative for rash.  Neurological:  Negative for light-headedness and headaches.  Psychiatric/Behavioral:  Negative for agitation and behavioral problems.   All other systems reviewed and are negative.  Per HPI unless specifically indicated above   Allergies as of 02/24/2021        Reactions   Clindamycin/lincomycin Rash   Penicillins    Unknown        Medication List        Accurate as of February 24, 2021  4:32 PM. If you have any questions, ask your nurse or doctor.          STOP taking these medications    Efinaconazole 10 % Soln Stopped by: Worthy Rancher, MD   HYDROcodone-acetaminophen 10-325 MG tablet Commonly known as: NORCO Stopped by: Fransisca Kaufmann Malajah Oceguera, MD   predniSONE 10 MG (21) Tbpk tablet Commonly known as: STERAPRED UNI-PAK 21 TAB Replaced by: predniSONE 20 MG tablet Stopped by: Fransisca Kaufmann Toinette Lackie, MD       TAKE these medications    albuterol 108 (90 Base) MCG/ACT inhaler Commonly known as: VENTOLIN HFA INHALE 2 PUFFS EVERY 6 HOURS AS NEEDED FOR WHEEZING OR SHORTNESS OF BREATH   ALPRAZolam 0.5 MG tablet Commonly known as: XANAX Take 0.5 mg by mouth at bedtime as needed for anxiety.   Armodafinil 250 MG tablet Take 250 mg by mouth daily.   aspirin 81 MG EC tablet Take 1 tablet by mouth daily.   atorvastatin 80 MG tablet Commonly known as: LIPITOR Take 1 tablet (80 mg total) by mouth daily.   buPROPion 150 MG 24 hr tablet Commonly known as: WELLBUTRIN XL Take 450 mg by mouth daily.   cefdinir 300 MG capsule Commonly known as: OMNICEF Take 1 capsule (300 mg total) by mouth 2 (two)  times daily. 1 po BID Started by: Fransisca Kaufmann Amoni Morales, MD   ezetimibe 10 MG tablet Commonly known as: ZETIA Take 1 tablet (10 mg total) by mouth daily.   hydrocortisone cream 0.5 % Apply 1 application topically 2 (two) times daily.   Kerasal Fungal Nail Renewal Soln Apply 1 application topically 2 (two) times daily.   nortriptyline 50 MG capsule Commonly known as: PAMELOR Take 50 mg by mouth at bedtime.   olmesartan 40 MG tablet Commonly known as: BENICAR TAKE 1 TABLET DAILY   pantoprazole 40 MG tablet Commonly known as: PROTONIX Take 1 tablet (40 mg total) by mouth daily.   predniSONE 20 MG tablet Commonly known  as: DELTASONE 2 po at same time daily for 5 days Replaces: predniSONE 10 MG (21) Tbpk tablet Started by: Worthy Rancher, MD   promethazine-dextromethorphan 6.25-15 MG/5ML syrup Commonly known as: PROMETHAZINE-DM Take 5 mLs by mouth 4 (four) times daily as needed for cough. Started by: Fransisca Kaufmann Yitta Gongaware, MD   tizanidine 2 MG capsule Commonly known as: ZANAFLEX Take 2 mg by mouth 3 (three) times daily.   Trelegy Ellipta 200-62.5-25 MCG/INH Aepb Generic drug: Fluticasone-Umeclidin-Vilant INHALE 1 PUFF ONCE A DAY   UBRELVY PO Take by mouth.         Objective:   Pulse 90   SpO2 93%   Wt Readings from Last 3 Encounters:  02/07/21 168 lb (76.2 kg)  12/01/20 168 lb (76.2 kg)  11/17/20 163 lb 6.4 oz (74.1 kg)    Physical Exam Vitals reviewed.  Constitutional:      General: She is not in acute distress.    Appearance: She is well-developed. She is not diaphoretic.  HENT:     Right Ear: Tympanic membrane, ear canal and external ear normal.     Left Ear: Tympanic membrane, ear canal and external ear normal.     Nose: Mucosal edema present. No rhinorrhea.     Right Sinus: No maxillary sinus tenderness or frontal sinus tenderness.     Left Sinus: No maxillary sinus tenderness or frontal sinus tenderness.     Mouth/Throat:     Pharynx: Uvula midline. No oropharyngeal exudate or posterior oropharyngeal erythema.     Tonsils: No tonsillar abscesses.  Eyes:     Conjunctiva/sclera: Conjunctivae normal.  Cardiovascular:     Rate and Rhythm: Normal rate and regular rhythm.     Heart sounds: Normal heart sounds. No murmur heard. Pulmonary:     Effort: Pulmonary effort is normal. No respiratory distress.     Breath sounds: Normal breath sounds. No wheezing, rhonchi or rales.  Chest:     Chest wall: No tenderness.  Musculoskeletal:        General: No tenderness. Normal range of motion.  Skin:    General: Skin is warm and dry.     Findings: No rash.  Neurological:      Mental Status: She is alert and oriented to person, place, and time.     Coordination: Coordination normal.  Psychiatric:        Behavior: Behavior normal.      Assessment & Plan:   Problem List Items Addressed This Visit   None Visit Diagnoses     COVID-19 virus infection    -  Primary   Relevant Medications   promethazine-dextromethorphan (PROMETHAZINE-DM) 6.25-15 MG/5ML syrup   predniSONE (DELTASONE) 20 MG tablet   cefdinir (OMNICEF) 300 MG capsule   COPD exacerbation (HCC)       Relevant  Medications   promethazine-dextromethorphan (PROMETHAZINE-DM) 6.25-15 MG/5ML syrup   predniSONE (DELTASONE) 20 MG tablet   cefdinir (OMNICEF) 300 MG capsule     Will treat like COPD exacerbation, lungs do not sound like pneumonia.  Likely post-COVID COPD exacerbation  Follow up plan: Return if symptoms worsen or fail to improve.  Counseling provided for all of the vaccine components No orders of the defined types were placed in this encounter.   Caryl Pina, MD [facility] 02/24/2021, 4:32 PM

## 2021-03-01 ENCOUNTER — Telehealth: Payer: Self-pay | Admitting: Family Medicine

## 2021-03-01 NOTE — Telephone Encounter (Signed)
Called patient, no answer, left message to return call 

## 2021-03-01 NOTE — Telephone Encounter (Signed)
Appointment scheduled.

## 2021-03-01 NOTE — Telephone Encounter (Signed)
Pt returning call

## 2021-03-06 DIAGNOSIS — G4731 Primary central sleep apnea: Secondary | ICD-10-CM | POA: Diagnosis not present

## 2021-03-07 DIAGNOSIS — G4731 Primary central sleep apnea: Secondary | ICD-10-CM | POA: Diagnosis not present

## 2021-03-08 DIAGNOSIS — M170 Bilateral primary osteoarthritis of knee: Secondary | ICD-10-CM | POA: Diagnosis not present

## 2021-03-08 DIAGNOSIS — M5136 Other intervertebral disc degeneration, lumbar region: Secondary | ICD-10-CM | POA: Diagnosis not present

## 2021-03-08 DIAGNOSIS — Z79891 Long term (current) use of opiate analgesic: Secondary | ICD-10-CM | POA: Diagnosis not present

## 2021-03-08 DIAGNOSIS — Z79899 Other long term (current) drug therapy: Secondary | ICD-10-CM | POA: Diagnosis not present

## 2021-03-08 DIAGNOSIS — G894 Chronic pain syndrome: Secondary | ICD-10-CM | POA: Diagnosis not present

## 2021-03-08 DIAGNOSIS — M47814 Spondylosis without myelopathy or radiculopathy, thoracic region: Secondary | ICD-10-CM | POA: Diagnosis not present

## 2021-03-11 ENCOUNTER — Other Ambulatory Visit: Payer: Self-pay

## 2021-03-11 ENCOUNTER — Encounter: Payer: Self-pay | Admitting: Family Medicine

## 2021-03-11 ENCOUNTER — Ambulatory Visit (INDEPENDENT_AMBULATORY_CARE_PROVIDER_SITE_OTHER): Payer: Medicare HMO | Admitting: Family Medicine

## 2021-03-11 VITALS — BP 134/70 | HR 93 | Temp 97.5°F | Ht 65.0 in | Wt 165.8 lb

## 2021-03-11 DIAGNOSIS — R5383 Other fatigue: Secondary | ICD-10-CM | POA: Diagnosis not present

## 2021-03-11 DIAGNOSIS — I10 Essential (primary) hypertension: Secondary | ICD-10-CM

## 2021-03-11 DIAGNOSIS — J432 Centrilobular emphysema: Secondary | ICD-10-CM

## 2021-03-11 MED ORDER — OLMESARTAN MEDOXOMIL 40 MG PO TABS: 40.0000 mg | ORAL_TABLET | Freq: Every day | ORAL | 3 refills | Status: DC

## 2021-03-11 MED ORDER — PREDNISONE 10 MG PO TABS: ORAL_TABLET | ORAL | 0 refills | Status: DC

## 2021-03-11 MED ORDER — ATORVASTATIN CALCIUM 80 MG PO TABS: 80.0000 mg | ORAL_TABLET | Freq: Every day | ORAL | 3 refills | Status: DC

## 2021-03-11 MED ORDER — IPRATROPIUM-ALBUTEROL 0.5-2.5 (3) MG/3ML IN SOLN: 3.0000 mL | Freq: Four times a day (QID) | RESPIRATORY_TRACT | 3 refills | Status: DC | PRN

## 2021-03-11 NOTE — Progress Notes (Signed)
Subjective:  Patient ID: Sarah Chapman, female    DOB: 1960-11-23  Age: 60 y.o. MRN: [id number]  CC: Hypertension   HPI Sarah Chapman presents for  follow-up of hypertension. Patient has no history of headache chest pain or shortness of breath or recent cough. Patient also denies symptoms of TIA such as focal numbness or weakness. Patient denies side effects from medication. States taking it regularly.  Getting dizzy. Yesterday at 5:30 BP was 122/76, pulse 90. 146/80 this AM. Occurs when she first gets up and lasts 2 minutes. No vertigo. Feels like she needs to sit down.   COPD not good. Had COVID 2 weeks ago. Coughing phlegm. Breathing not back to normal.     GAD 7 : Generalized Anxiety Score 03/11/2021 02/07/2021  Nervous, Anxious, on Edge 2 1  Control/stop worrying 0 0  Worry too much - different things 0 0  Trouble relaxing 2 1  Restless 0 0  Easily annoyed or irritable 2 2  Afraid - awful might happen 0 0  Total GAD 7 Score 6 4  Anxiety Difficulty Somewhat difficult Somewhat difficult    Depression screen [facility] 2/9 03/11/2021 02/07/2021 12/01/2020 11/02/2020 07/06/2020  Decreased Interest 2 1 2  0 0  Down, Depressed, Hopeless 2 1 2 1  0  PHQ - 2 Score 4 2 4 1  0  Altered sleeping 2 2 1  - -  Tired, decreased energy 2 2 3  - -  Change in appetite 0 3 2 - -  Feeling bad or failure about yourself  0 0 1 - -  Trouble concentrating 2 2 2  - -  Moving slowly or fidgety/restless 2 1 2  - -  Suicidal thoughts 0 0 0 - -  PHQ-9 Score 12 12 15  - -  Difficult doing work/chores Very difficult Somewhat difficult Somewhat difficult - -  Some recent data might be hidden    History Sarah Chapman has a past medical history of Allergic rhinitis, Back pain, Chronic headache, COPD (chronic obstructive pulmonary disease) ([place]), Emphysema of lung ([place]), Hyperlipidemia, Hypertension, Neck pain, Sleep apnea, and Vaginal Pap smear, abnormal.   She has a past surgical history that includes Neck surgery; Tonsillectomy;  Cesarean section; Gynecologic cryosurgery; and Colonoscopy (N/A, 01/07/2015).   Her family history includes Bone cancer in her mother; Cancer in her maternal grandmother; Colon cancer (age of onset: 11) in her brother; Heart attack in her brother; Mental illness in her son.She reports that she quit smoking about 4 years ago. Her smoking use included cigarettes. She has a 33.00 pack-year smoking history. She has never used smokeless tobacco. She reports that she does not drink alcohol and does not use drugs.  Current Outpatient Medications on File Prior to Visit  Medication Sig Dispense Refill   albuterol (VENTOLIN HFA) 108 (90 Base) MCG/ACT inhaler INHALE 2 PUFFS EVERY 6 HOURS AS NEEDED FOR WHEEZING OR SHORTNESS OF BREATH 18 g 3   ALPRAZolam (XANAX) 0.5 MG tablet Take 0.5 mg by mouth at bedtime as needed for anxiety.     Armodafinil 250 MG tablet Take 250 mg by mouth daily.     aspirin 81 MG EC tablet Take 1 tablet by mouth daily.     buPROPion (WELLBUTRIN XL) 150 MG 24 hr tablet Take 450 mg by mouth daily.     Dermatological Products, Misc. (KERASAL FUNGAL NAIL RENEWAL) SOLN Apply 1 application topically 2 (two) times daily. 10 mL 5   hydrocortisone cream 0.5 % Apply 1 application topically 2 (two)  times daily. 30 g 0   nortriptyline (PAMELOR) 50 MG capsule Take 50 mg by mouth at bedtime.     pantoprazole (PROTONIX) 40 MG tablet Take 1 tablet (40 mg total) by mouth daily. 90 tablet 3   promethazine-dextromethorphan (PROMETHAZINE-DM) 6.25-15 MG/5ML syrup Take 5 mLs by mouth 4 (four) times daily as needed for cough. 118 mL 1   tizanidine (ZANAFLEX) 2 MG capsule Take 2 mg by mouth 3 (three) times daily.     TRELEGY ELLIPTA 200-62.5-25 MCG/INH AEPB INHALE 1 PUFF ONCE A DAY 60 each 5   Ubrogepant (UBRELVY PO) Take by mouth.     ezetimibe (ZETIA) 10 MG tablet Take 1 tablet (10 mg total) by mouth daily. 90 tablet 3   No current facility-administered medications on file prior to visit.     ROS Review of Systems  Constitutional:  Positive for fatigue.  HENT: Negative.    Eyes:  Negative for visual disturbance.  Respiratory:  Positive for cough and shortness of breath.   Cardiovascular:  Negative for chest pain.  Gastrointestinal:  Negative for abdominal pain.  Musculoskeletal:  Negative for arthralgias.   Objective:  BP 134/70   Pulse 93   Temp (!) 97.5 F (36.4 C)   Ht 5\' 5"  (1.651 m)   Wt 165 lb 12.8 oz (75.2 kg)   SpO2 96%   BMI 27.59 kg/m   BP Readings from Last 3 Encounters:  03/11/21 134/70  02/07/21 117/73  12/01/20 (!) 165/101    Wt Readings from Last 3 Encounters:  03/11/21 165 lb 12.8 oz (75.2 kg)  02/07/21 168 lb (76.2 kg)  12/01/20 168 lb (76.2 kg)     Physical Exam Constitutional:      General: She is not in acute distress.    Appearance: She is well-developed.  Cardiovascular:     Rate and Rhythm: Normal rate and regular rhythm.  Pulmonary:     Breath sounds: Normal breath sounds.  Musculoskeletal:        General: Normal range of motion.  Skin:    General: Skin is warm and dry.  Neurological:     Mental Status: She is alert and oriented to person, place, and time.      Assessment & Plan:   Sarah Chapman was seen today for hypertension.  Diagnoses and all orders for this visit:  Centrilobular emphysema ([place]) -     For home use only DME Nebulizer machine  Essential hypertension -     olmesartan (BENICAR) 40 MG tablet; Take 1 tablet (40 mg total) by mouth daily.  Fatigue, unspecified type -     TSH + free T4  Other orders -     atorvastatin (LIPITOR) 80 MG tablet; Take 1 tablet (80 mg total) by mouth daily. -     predniSONE (DELTASONE) 10 MG tablet; Take 5 daily for 2 days followed by 4,3,2 and 1 for 2 days each. -     ipratropium-albuterol (DUONEB) 0.5-2.5 (3) MG/3ML SOLN; Take 3 mLs by nebulization every 6 (six) hours as needed.  Allergies as of 03/11/2021       Reactions   Clindamycin/lincomycin Rash   Penicillins     Unknown        Medication List        Accurate as of March 11, 2021  3:15 PM. If you have any questions, ask your nurse or doctor.          STOP taking these medications    cefdinir 300 MG  capsule Commonly known as: OMNICEF Stopped by: Claretta Fraise, MD       TAKE these medications    albuterol 108 (90 Base) MCG/ACT inhaler Commonly known as: VENTOLIN HFA INHALE 2 PUFFS EVERY 6 HOURS AS NEEDED FOR WHEEZING OR SHORTNESS OF BREATH   ALPRAZolam 0.5 MG tablet Commonly known as: XANAX Take 0.5 mg by mouth at bedtime as needed for anxiety.   Armodafinil 250 MG tablet Take 250 mg by mouth daily.   aspirin 81 MG EC tablet Take 1 tablet by mouth daily.   atorvastatin 80 MG tablet Commonly known as: LIPITOR Take 1 tablet (80 mg total) by mouth daily.   buPROPion 150 MG 24 hr tablet Commonly known as: WELLBUTRIN XL Take 450 mg by mouth daily.   ezetimibe 10 MG tablet Commonly known as: ZETIA Take 1 tablet (10 mg total) by mouth daily.   hydrocortisone cream 0.5 % Apply 1 application topically 2 (two) times daily.   ipratropium-albuterol 0.5-2.5 (3) MG/3ML Soln Commonly known as: DUONEB Take 3 mLs by nebulization every 6 (six) hours as needed. Started by: Claretta Fraise, MD   [place] Fungal Nail Renewal Soln Apply 1 application topically 2 (two) times daily.   nortriptyline 50 MG capsule Commonly known as: PAMELOR Take 50 mg by mouth at bedtime.   olmesartan 40 MG tablet Commonly known as: BENICAR Take 1 tablet (40 mg total) by mouth daily.   pantoprazole 40 MG tablet Commonly known as: PROTONIX Take 1 tablet (40 mg total) by mouth daily.   predniSONE 10 MG tablet Commonly known as: DELTASONE Take 5 daily for 2 days followed by 4,3,2 and 1 for 2 days each. What changed:  medication strength additional instructions Changed by: Claretta Fraise, MD   promethazine-dextromethorphan 6.25-15 MG/5ML syrup Commonly known as: PROMETHAZINE-DM Take 5 mLs  by mouth 4 (four) times daily as needed for cough.   tizanidine 2 MG capsule Commonly known as: ZANAFLEX Take 2 mg by mouth 3 (three) times daily.   Trelegy Ellipta 200-62.5-25 MCG/INH Aepb Generic drug: Fluticasone-Umeclidin-Vilant INHALE 1 PUFF ONCE A DAY   UBRELVY PO Take by mouth.               Durable Medical Equipment  (From admission, onward)           Start     Ordered   03/11/21 0000  For home use only DME Nebulizer machine       Comments: Dx: COPD  Question Answer Comment  Patient needs a nebulizer to treat with the following condition COPD mixed type ([place])   Length of Need Lifetime      03/11/21 1508            Meds ordered this encounter  Medications   atorvastatin (LIPITOR) 80 MG tablet    Sig: Take 1 tablet (80 mg total) by mouth daily.    Dispense:  90 tablet    Refill:  3   olmesartan (BENICAR) 40 MG tablet    Sig: Take 1 tablet (40 mg total) by mouth daily.    Dispense:  90 tablet    Refill:  3    Will get further refills at scheduled follow up   predniSONE (DELTASONE) 10 MG tablet    Sig: Take 5 daily for 2 days followed by 4,3,2 and 1 for 2 days each.    Dispense:  30 tablet    Refill:  0   ipratropium-albuterol (DUONEB) 0.5-2.5 (3) MG/3ML SOLN    Sig: Take  3 mLs by nebulization every 6 (six) hours as needed.    Dispense:  360 mL    Refill:  3      Follow-up: Return in about 6 months (around 09/09/2021), or if symptoms worsen or fail to improve.  Claretta Fraise, M.D.

## 2021-03-12 LAB — TSH+FREE T4
Free T4: 1.14 ng/dL (ref 0.82–1.77)
TSH: 1.39 u[IU]/mL (ref 0.450–4.500)

## 2021-03-13 DIAGNOSIS — M25522 Pain in left elbow: Secondary | ICD-10-CM | POA: Diagnosis not present

## 2021-03-13 DIAGNOSIS — M25512 Pain in left shoulder: Secondary | ICD-10-CM | POA: Diagnosis not present

## 2021-03-13 DIAGNOSIS — W19XXXA Unspecified fall, initial encounter: Secondary | ICD-10-CM | POA: Diagnosis not present

## 2021-03-13 DIAGNOSIS — S52131A Displaced fracture of neck of right radius, initial encounter for closed fracture: Secondary | ICD-10-CM | POA: Diagnosis not present

## 2021-03-13 DIAGNOSIS — M25532 Pain in left wrist: Secondary | ICD-10-CM | POA: Diagnosis not present

## 2021-03-14 DIAGNOSIS — M25512 Pain in left shoulder: Secondary | ICD-10-CM | POA: Diagnosis not present

## 2021-03-14 DIAGNOSIS — W19XXXA Unspecified fall, initial encounter: Secondary | ICD-10-CM | POA: Diagnosis not present

## 2021-03-14 DIAGNOSIS — M25422 Effusion, left elbow: Secondary | ICD-10-CM | POA: Diagnosis not present

## 2021-03-14 DIAGNOSIS — S52132A Displaced fracture of neck of left radius, initial encounter for closed fracture: Secondary | ICD-10-CM | POA: Diagnosis not present

## 2021-03-14 DIAGNOSIS — M25532 Pain in left wrist: Secondary | ICD-10-CM | POA: Diagnosis not present

## 2021-03-14 DIAGNOSIS — M25522 Pain in left elbow: Secondary | ICD-10-CM | POA: Diagnosis not present

## 2021-03-14 NOTE — Progress Notes (Signed)
Hello Sarah Chapman,  Your lab result is normal and/or stable.Some minor variations that are not significant are commonly marked abnormal, but do not represent any medical problem for you.  Best regards, Danyka Merlin, M.D.

## 2021-03-16 ENCOUNTER — Telehealth: Payer: Self-pay | Admitting: Gastroenterology

## 2021-03-16 ENCOUNTER — Ambulatory Visit: Payer: Medicare HMO | Admitting: Gastroenterology

## 2021-03-16 DIAGNOSIS — M25522 Pain in left elbow: Secondary | ICD-10-CM | POA: Diagnosis not present

## 2021-03-16 MED ORDER — DEXLANSOPRAZOLE 60 MG PO CPDR: 60.0000 mg | DELAYED_RELEASE_CAPSULE | Freq: Every day | ORAL | 3 refills | Status: DC

## 2021-03-16 NOTE — Addendum Note (Signed)
Addended by: Annitta Needs on: 03/16/2021 03:25 PM   Modules accepted: Orders

## 2021-03-16 NOTE — Telephone Encounter (Signed)
Completed and sent to [facility].

## 2021-03-16 NOTE — Telephone Encounter (Signed)
PATIENT SAID THAT HER REFLUX MEDICATION IS NOT WORKING AND SHE WOULD LIKE TO GO BACK TO [place].  PLEASE SEND TO HER PHARMACY

## 2021-03-16 NOTE — Telephone Encounter (Signed)
Phoned and advised the pt of her Rx being sent to the pharmacy. Pt advised to wait about a hour and call to see if it is ready. Pt expressed understanding

## 2021-03-16 NOTE — Telephone Encounter (Signed)
This pt wants to go back to using Dexilant because what she is taking now is not working. (Being back and forth I don't know how the PA will go or will she need one.)

## 2021-03-21 DIAGNOSIS — M7989 Other specified soft tissue disorders: Secondary | ICD-10-CM | POA: Diagnosis not present

## 2021-03-21 DIAGNOSIS — M25532 Pain in left wrist: Secondary | ICD-10-CM | POA: Diagnosis not present

## 2021-03-21 DIAGNOSIS — S52131A Displaced fracture of neck of right radius, initial encounter for closed fracture: Secondary | ICD-10-CM | POA: Diagnosis not present

## 2021-03-21 DIAGNOSIS — M25512 Pain in left shoulder: Secondary | ICD-10-CM | POA: Diagnosis not present

## 2021-03-21 DIAGNOSIS — S52132A Displaced fracture of neck of left radius, initial encounter for closed fracture: Secondary | ICD-10-CM | POA: Diagnosis not present

## 2021-03-21 DIAGNOSIS — M25522 Pain in left elbow: Secondary | ICD-10-CM | POA: Diagnosis not present

## 2021-03-25 DIAGNOSIS — Z760 Encounter for issue of repeat prescription: Secondary | ICD-10-CM | POA: Diagnosis not present

## 2021-03-25 DIAGNOSIS — M5412 Radiculopathy, cervical region: Secondary | ICD-10-CM | POA: Diagnosis not present

## 2021-03-25 DIAGNOSIS — G43719 Chronic migraine without aura, intractable, without status migrainosus: Secondary | ICD-10-CM | POA: Diagnosis not present

## 2021-03-25 DIAGNOSIS — H02403 Unspecified ptosis of bilateral eyelids: Secondary | ICD-10-CM | POA: Diagnosis not present

## 2021-03-28 DIAGNOSIS — S52131A Displaced fracture of neck of right radius, initial encounter for closed fracture: Secondary | ICD-10-CM | POA: Diagnosis not present

## 2021-03-28 DIAGNOSIS — W010XXA Fall on same level from slipping, tripping and stumbling without subsequent striking against object, initial encounter: Secondary | ICD-10-CM | POA: Diagnosis not present

## 2021-03-28 DIAGNOSIS — S52132A Displaced fracture of neck of left radius, initial encounter for closed fracture: Secondary | ICD-10-CM | POA: Diagnosis not present

## 2021-03-30 ENCOUNTER — Other Ambulatory Visit: Payer: Self-pay | Admitting: Family Medicine

## 2021-03-30 ENCOUNTER — Telehealth: Payer: Self-pay | Admitting: Family Medicine

## 2021-03-30 DIAGNOSIS — J432 Centrilobular emphysema: Secondary | ICD-10-CM

## 2021-03-30 NOTE — Telephone Encounter (Signed)
SCrip printed. Please fax to [place]

## 2021-03-30 NOTE — Telephone Encounter (Signed)
Pt called stating that Dr Livia Snellen sent in an Rx for nebulizer solution to [facility] but [facility] fill it because pt doesn't have a nebulizer machine and says they wont sell her one. Says pt needs a Rx for it.  Please advise and call patient.

## 2021-03-31 NOTE — Telephone Encounter (Signed)
Done

## 2021-03-31 NOTE — Telephone Encounter (Signed)
done

## 2021-03-31 NOTE — Telephone Encounter (Signed)
Pt said she called [facility] and they did not receive. Please resend.

## 2021-04-01 ENCOUNTER — Ambulatory Visit (HOSPITAL_COMMUNITY)
Admission: RE | Admit: 2021-04-01 | Discharge: 2021-04-01 | Disposition: A | Discharge status: Home / Self Care | Payer: Medicare HMO | Source: Ambulatory Visit | Attending: Cardiology | Admitting: Cardiology

## 2021-04-01 ENCOUNTER — Other Ambulatory Visit: Payer: Self-pay

## 2021-04-01 DIAGNOSIS — I6529 Occlusion and stenosis of unspecified carotid artery: Secondary | ICD-10-CM | POA: Diagnosis not present

## 2021-04-01 DIAGNOSIS — I6523 Occlusion and stenosis of bilateral carotid arteries: Secondary | ICD-10-CM | POA: Diagnosis not present

## 2021-04-05 DIAGNOSIS — M7918 Myalgia, other site: Secondary | ICD-10-CM | POA: Diagnosis not present

## 2021-04-05 DIAGNOSIS — G894 Chronic pain syndrome: Secondary | ICD-10-CM | POA: Diagnosis not present

## 2021-04-05 DIAGNOSIS — M47814 Spondylosis without myelopathy or radiculopathy, thoracic region: Secondary | ICD-10-CM | POA: Diagnosis not present

## 2021-04-05 DIAGNOSIS — M5136 Other intervertebral disc degeneration, lumbar region: Secondary | ICD-10-CM | POA: Diagnosis not present

## 2021-04-06 DIAGNOSIS — G4731 Primary central sleep apnea: Secondary | ICD-10-CM | POA: Diagnosis not present

## 2021-04-07 DIAGNOSIS — G4731 Primary central sleep apnea: Secondary | ICD-10-CM | POA: Diagnosis not present

## 2021-04-07 NOTE — Telephone Encounter (Signed)
Pt's Haematologist sent a list of formulary's covered which are as follows: Omeprazole Pantoprazole Dexlansoprazole or covered alternatives.

## 2021-04-11 ENCOUNTER — Other Ambulatory Visit: Payer: Self-pay

## 2021-04-11 ENCOUNTER — Telehealth: Payer: Self-pay

## 2021-04-11 DIAGNOSIS — I6529 Occlusion and stenosis of unspecified carotid artery: Secondary | ICD-10-CM

## 2021-04-11 NOTE — Telephone Encounter (Addendum)
Left message to advise [place] vas no changes and if any question please call office.----- Message from Warren Lacy, PA-C sent at 04/07/2021 12:10 PM EST ----- Moderate bilateral carotid disease.  Stable from last year.  Continue aspirin and statin.  Recommend repeating carotid ultrasound in 1 year. [place] [place] - please order carotid duplex for 03/2022.

## 2021-04-12 DIAGNOSIS — S52122A Displaced fracture of head of left radius, initial encounter for closed fracture: Secondary | ICD-10-CM | POA: Diagnosis not present

## 2021-04-12 DIAGNOSIS — X58XXXA Exposure to other specified factors, initial encounter: Secondary | ICD-10-CM | POA: Diagnosis not present

## 2021-04-12 DIAGNOSIS — S52132A Displaced fracture of neck of left radius, initial encounter for closed fracture: Secondary | ICD-10-CM | POA: Diagnosis not present

## 2021-04-26 DIAGNOSIS — F411 Generalized anxiety disorder: Secondary | ICD-10-CM | POA: Diagnosis not present

## 2021-04-26 DIAGNOSIS — F900 Attention-deficit hyperactivity disorder, predominantly inattentive type: Secondary | ICD-10-CM | POA: Diagnosis not present

## 2021-04-26 DIAGNOSIS — G8921 Chronic pain due to trauma: Secondary | ICD-10-CM | POA: Diagnosis not present

## 2021-04-26 DIAGNOSIS — R69 Illness, unspecified: Secondary | ICD-10-CM | POA: Diagnosis not present

## 2021-05-03 DIAGNOSIS — M7918 Myalgia, other site: Secondary | ICD-10-CM | POA: Diagnosis not present

## 2021-05-03 DIAGNOSIS — M47814 Spondylosis without myelopathy or radiculopathy, thoracic region: Secondary | ICD-10-CM | POA: Diagnosis not present

## 2021-05-03 DIAGNOSIS — G894 Chronic pain syndrome: Secondary | ICD-10-CM | POA: Diagnosis not present

## 2021-05-03 DIAGNOSIS — M47812 Spondylosis without myelopathy or radiculopathy, cervical region: Secondary | ICD-10-CM | POA: Diagnosis not present

## 2021-05-07 DIAGNOSIS — G4731 Primary central sleep apnea: Secondary | ICD-10-CM | POA: Diagnosis not present

## 2021-05-12 DIAGNOSIS — S52132A Displaced fracture of neck of left radius, initial encounter for closed fracture: Secondary | ICD-10-CM | POA: Diagnosis not present

## 2021-05-17 DIAGNOSIS — J329 Chronic sinusitis, unspecified: Secondary | ICD-10-CM | POA: Diagnosis not present

## 2021-05-17 DIAGNOSIS — R059 Cough, unspecified: Secondary | ICD-10-CM | POA: Diagnosis not present

## 2021-05-17 DIAGNOSIS — R5383 Other fatigue: Secondary | ICD-10-CM | POA: Diagnosis not present

## 2021-05-17 DIAGNOSIS — J029 Acute pharyngitis, unspecified: Secondary | ICD-10-CM | POA: Diagnosis not present

## 2021-05-18 ENCOUNTER — Telehealth: Payer: Medicare HMO | Admitting: Family Medicine

## 2021-05-26 ENCOUNTER — Telehealth: Payer: Self-pay | Admitting: Family Medicine

## 2021-05-26 NOTE — Telephone Encounter (Signed)
°  Left message for patient to call back and schedule Medicare Annual Wellness Visit (AWV) to be completed by video or phone.  No hx of AWV eligible for AWVI as of 05/29/2009 per [facility]   Please schedule at anytime with [place] --- Karle Starch  45 Minutes appointment   Any questions, please call me at [phone]

## 2021-06-07 DIAGNOSIS — M5136 Other intervertebral disc degeneration, lumbar region: Secondary | ICD-10-CM | POA: Diagnosis not present

## 2021-06-07 DIAGNOSIS — M797 Fibromyalgia: Secondary | ICD-10-CM | POA: Insufficient documentation

## 2021-06-07 DIAGNOSIS — M47812 Spondylosis without myelopathy or radiculopathy, cervical region: Secondary | ICD-10-CM | POA: Insufficient documentation

## 2021-06-07 DIAGNOSIS — Z79899 Other long term (current) drug therapy: Secondary | ICD-10-CM | POA: Diagnosis not present

## 2021-06-07 DIAGNOSIS — M546 Pain in thoracic spine: Secondary | ICD-10-CM | POA: Diagnosis not present

## 2021-06-07 DIAGNOSIS — G4731 Primary central sleep apnea: Secondary | ICD-10-CM | POA: Diagnosis not present

## 2021-06-07 DIAGNOSIS — Z79891 Long term (current) use of opiate analgesic: Secondary | ICD-10-CM | POA: Diagnosis not present

## 2021-06-07 DIAGNOSIS — T819XXA Unspecified complication of procedure, initial encounter: Secondary | ICD-10-CM | POA: Insufficient documentation

## 2021-06-07 DIAGNOSIS — G894 Chronic pain syndrome: Secondary | ICD-10-CM | POA: Diagnosis not present

## 2021-06-07 DIAGNOSIS — M7918 Myalgia, other site: Secondary | ICD-10-CM | POA: Diagnosis not present

## 2021-06-22 ENCOUNTER — Telehealth: Payer: Self-pay | Admitting: Family Medicine

## 2021-06-22 NOTE — Telephone Encounter (Signed)
°  Left message for patient to call back and schedule Medicare Annual Wellness Visit (AWV) to be completed by video or phone.  No hx of AWV eligible for AWVI as of 05/29/2009 per [employer]   Please schedule at anytime with [place] --- Karle Starch  45 Minutes appointment   Any questions, please call me at [phone]

## 2021-06-23 DIAGNOSIS — S52131G Displaced fracture of neck of right radius, subsequent encounter for closed fracture with delayed healing: Secondary | ICD-10-CM | POA: Diagnosis not present

## 2021-06-23 DIAGNOSIS — M25522 Pain in left elbow: Secondary | ICD-10-CM | POA: Diagnosis not present

## 2021-06-28 DIAGNOSIS — I6523 Occlusion and stenosis of bilateral carotid arteries: Secondary | ICD-10-CM | POA: Diagnosis not present

## 2021-06-28 DIAGNOSIS — G43719 Chronic migraine without aura, intractable, without status migrainosus: Secondary | ICD-10-CM | POA: Diagnosis not present

## 2021-06-28 DIAGNOSIS — R635 Abnormal weight gain: Secondary | ICD-10-CM | POA: Diagnosis not present

## 2021-06-28 DIAGNOSIS — R202 Paresthesia of skin: Secondary | ICD-10-CM | POA: Diagnosis not present

## 2021-06-28 DIAGNOSIS — E531 Pyridoxine deficiency: Secondary | ICD-10-CM | POA: Diagnosis not present

## 2021-06-28 DIAGNOSIS — R413 Other amnesia: Secondary | ICD-10-CM | POA: Diagnosis not present

## 2021-06-28 DIAGNOSIS — R4182 Altered mental status, unspecified: Secondary | ICD-10-CM | POA: Diagnosis not present

## 2021-06-28 DIAGNOSIS — E538 Deficiency of other specified B group vitamins: Secondary | ICD-10-CM | POA: Diagnosis not present

## 2021-07-05 DIAGNOSIS — G894 Chronic pain syndrome: Secondary | ICD-10-CM | POA: Diagnosis not present

## 2021-07-05 DIAGNOSIS — M5136 Other intervertebral disc degeneration, lumbar region: Secondary | ICD-10-CM | POA: Diagnosis not present

## 2021-07-05 DIAGNOSIS — M47814 Spondylosis without myelopathy or radiculopathy, thoracic region: Secondary | ICD-10-CM | POA: Diagnosis not present

## 2021-07-05 DIAGNOSIS — M25569 Pain in unspecified knee: Secondary | ICD-10-CM | POA: Diagnosis not present

## 2021-07-08 ENCOUNTER — Other Ambulatory Visit: Payer: Self-pay | Admitting: Family Medicine

## 2021-07-08 ENCOUNTER — Other Ambulatory Visit: Payer: Self-pay | Admitting: Pulmonary Disease

## 2021-07-08 ENCOUNTER — Telehealth: Payer: Self-pay | Admitting: Pulmonary Disease

## 2021-07-08 DIAGNOSIS — G4731 Primary central sleep apnea: Secondary | ICD-10-CM | POA: Diagnosis not present

## 2021-07-08 MED ORDER — FLUTICASONE-UMECLIDIN-VILANT 200-62.5-25 MCG/ACT IN AEPB: INHALATION_SPRAY | RESPIRATORY_TRACT | 0 refills | Status: DC

## 2021-07-08 NOTE — Telephone Encounter (Signed)
Spoke with the pt and notified will refill trelegy x 1 pending planned appt  She is aware to keep appt for additional rx  Trelegy sent to pharm x 1 only

## 2021-07-12 ENCOUNTER — Encounter: Payer: Self-pay | Admitting: Pulmonary Disease

## 2021-07-12 ENCOUNTER — Other Ambulatory Visit: Payer: Self-pay

## 2021-07-12 ENCOUNTER — Ambulatory Visit: Payer: Medicare HMO | Admitting: Pulmonary Disease

## 2021-07-12 VITALS — BP 140/78 | HR 88 | Temp 98.1°F | Ht 65.0 in | Wt 174.2 lb

## 2021-07-12 DIAGNOSIS — J432 Centrilobular emphysema: Secondary | ICD-10-CM | POA: Diagnosis not present

## 2021-07-12 DIAGNOSIS — G4733 Obstructive sleep apnea (adult) (pediatric): Secondary | ICD-10-CM

## 2021-07-12 DIAGNOSIS — Z87891 Personal history of nicotine dependence: Secondary | ICD-10-CM

## 2021-07-12 MED ORDER — FLUTICASONE-UMECLIDIN-VILANT 200-62.5-25 MCG/ACT IN AEPB: INHALATION_SPRAY | RESPIRATORY_TRACT | 2 refills | Status: DC

## 2021-07-12 NOTE — Addendum Note (Signed)
Addended by: Elton Sin on: 07/12/2021 04:45 PM   Modules accepted: Orders

## 2021-07-12 NOTE — Progress Notes (Signed)
Sarah KROGSTAD    [id number]    07/10/1960  Primary Care Physician:Stacks, Cletus Gash, MD  Referring Physician: Claretta Fraise, MD [place],  [place] [address]  Chief complaint:  Follow up for COPD GOLD D (CAT score 13, several exacerbations over past year)  HPI: Sarah Chapman is a 61 year old with history of COPD. She has been diagnosed in 2016 with PFTs that showed mild to moderate obstruction. She has been maintained on Advair but stopped taking this 1 month ago because of thrush.. She reports daily symptoms of dyspnea on exertion, cough with sputum production. She also saw al allergist in March 2018, diagnosed with allergies, asthma and was given astelin, fluticasone nasal sprays  She has about 25-pack-year smoking history and quit in 2018. 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 She was diagnosed with a oibstructive sleep apnea in 2016 and is maintained on CPAP. She follows up with the sleep doctor at [facility] cannot remember the name]  Interim History: Continues on Trelegy inhaler Continues to be short of breath on minimal exertion  Denies any cough, sputum production, fevers, chills  Outpatient Encounter Medications as of 07/12/2021  Medication Sig   albuterol (VENTOLIN HFA) 108 (90 Base) MCG/ACT inhaler INHALE 2 PUFFS EVERY 6 HOURS AS NEEDED FOR WHEEZING OR SHORTNESS OF BREATH   ALPRAZolam (XANAX) 0.5 MG tablet Take 0.5 mg by mouth at bedtime as needed for anxiety.   Armodafinil 250 MG tablet Take 250 mg by mouth daily.   aspirin 81 MG EC tablet Take 1 tablet by mouth daily.   buPROPion (WELLBUTRIN XL) 150 MG 24 hr tablet Take 450 mg by mouth daily.   dexlansoprazole (DEXILANT) 60 MG capsule Take 1 capsule (60 mg total) by mouth daily.   Fluticasone-Umeclidin-Vilant (TRELEGY ELLIPTA) 200-62.5-25 MCG/ACT AEPB INHALE 1 PUFF ONCE A DAY   ipratropium-albuterol (DUONEB) 0.5-2.5 (3) MG/3ML SOLN Take 3 mLs  by nebulization every 6 (six) hours as needed.   nortriptyline (PAMELOR) 50 MG capsule Take 50 mg by mouth at bedtime.   olmesartan (BENICAR) 40 MG tablet Take 1 tablet (40 mg total) by mouth daily.   tizanidine (ZANAFLEX) 2 MG capsule Take 2 mg by mouth 3 (three) times daily.   Ubrogepant (UBRELVY PO) Take by mouth.   atorvastatin (LIPITOR) 80 MG tablet Take 1 tablet (80 mg total) by mouth daily.   ezetimibe (ZETIA) 10 MG tablet Take 1 tablet (10 mg total) by mouth daily.   [DISCONTINUED] Dermatological Products, Misc. (KERASAL FUNGAL NAIL RENEWAL) SOLN Apply 1 application topically 2 (two) times daily.   [DISCONTINUED] hydrocortisone cream 0.5 % Apply 1 application topically 2 (two) times daily.   [DISCONTINUED] pantoprazole (PROTONIX) 40 MG tablet Take 1 tablet (40 mg total) by mouth daily.   [DISCONTINUED] predniSONE (DELTASONE) 10 MG tablet Take 5 daily for 2 days followed by 4,3,2 and 1 for 2 days each.   [DISCONTINUED] promethazine-dextromethorphan (PROMETHAZINE-DM) 6.25-15 MG/5ML syrup Take 5 mLs by mouth 4 (four) times daily as needed for cough.   No facility-administered encounter medications on file as of 07/12/2021.   Physical Exam: Blood pressure 140/78, pulse 88, temperature 98.1 F (36.7 C), temperature source Oral, height 5\' 5"  (1.651 m), weight 174 lb 3.2 oz (79 kg), SpO2 98 %. Gen:      No acute distress HEENT:  EOMI, sclera anicteric Neck:     No masses; no  thyromegaly Lungs:    Clear to auscultation bilaterally; normal respiratory effort CV:         Regular rate and rhythm; no murmurs Abd:      + bowel sounds; soft, non-tender; no palpable masses, no distension Ext:    No edema; adequate peripheral perfusion Skin:      Warm and dry; no rash Neuro: alert and oriented x 3 Psych: normal mood and affect   Data Reviewed: Imaging:  Chest x-ray 08/25/19-linear atelectasis in the right middle lobe.  Screening CT chest 09/11/2019-moderate emphysema. Screening CT chest  02/14/2021-moderate emphysema, bibasal cylindrical bronchiectasis and volume loss I have reviewed the images personally  PFTs: 04/16/15 Mild to moderate obstruction with reduction in expiratory flow post inhaled bronchodilator. Normal lung volumes and diffusing capacity.  09/04/19 FVC 3.24 [91%], FEV1 2.48 [91%] F/F 77, DLCO 15.82 [73%] Minimal obstructive airway disease with minimal restriction or diffusion defect.  Labs: A1AT 08/22/16- 137, PIMM  CBC 02/07/2022-WBC 11.3, eos 1%, absolute eosinophil count 113 IgE 06/22/2020-246  Sleep: Sleep study 03/02/15 RDI 24.9 Sat 87% Average Sat 91%   Cardiac: Echocardiogram 09/30/2019-LVEF 17-49%, RV systolic function is normal  Assessment:  COPD GOLD D Continues on Trelegy inhaler Follow-up PFTs reviewed with minimal obstruction.  Her echo shows normal LV function Suspect dyspnea is multifactorial from minimal COPD, cardiac disease, weight gain and deconditioning  Can consider cardiopulmonary exercise test but she is reluctant We talked about pulmonary rehab as she will benefit from aerobic exercise but she wants to start an exercise regimen on her own at the local gym  Ex-smoker Continue screening CT chest.  OSA Stable on CPAP  Plan/Recommendations: -Continue trelegy - Exercise  Marshell Garfinkel MD [facility] Pager [phone] 07/12/2021, 2:38 PM  CC: Claretta Fraise, MD

## 2021-07-12 NOTE — Patient Instructions (Signed)
Continue the Trelegy inhaler Start exercise therapy at the [facility] If you are still short of breath then we may need to do a cardiopulmonary exercise test to evaluate cause of dyspnea Follow-up in 6 months.

## 2021-07-28 ENCOUNTER — Telehealth: Payer: Self-pay | Admitting: Family Medicine

## 2021-07-28 DIAGNOSIS — F900 Attention-deficit hyperactivity disorder, predominantly inattentive type: Secondary | ICD-10-CM | POA: Diagnosis not present

## 2021-07-28 DIAGNOSIS — R69 Illness, unspecified: Secondary | ICD-10-CM | POA: Diagnosis not present

## 2021-07-28 DIAGNOSIS — G8921 Chronic pain due to trauma: Secondary | ICD-10-CM | POA: Diagnosis not present

## 2021-07-28 DIAGNOSIS — F411 Generalized anxiety disorder: Secondary | ICD-10-CM | POA: Diagnosis not present

## 2021-07-28 DIAGNOSIS — F339 Major depressive disorder, recurrent, unspecified: Secondary | ICD-10-CM | POA: Diagnosis not present

## 2021-07-28 NOTE — Telephone Encounter (Signed)
?  Left message for patient to call back and schedule Medicare Annual Wellness Visit (AWV) to be completed by video or phone. ? ?No hx of AWV eligible for AWVI per [facility] as of  05/29/2009 ? ?Please schedule at anytime with Munjor --- Karle Starch ? ?61 Minutes appointment  ? ?Any questions, please call me at [phone]   ?

## 2021-07-29 ENCOUNTER — Other Ambulatory Visit: Payer: Self-pay | Admitting: Cardiology

## 2021-08-03 DIAGNOSIS — M47812 Spondylosis without myelopathy or radiculopathy, cervical region: Secondary | ICD-10-CM | POA: Diagnosis not present

## 2021-08-03 DIAGNOSIS — M7918 Myalgia, other site: Secondary | ICD-10-CM | POA: Diagnosis not present

## 2021-08-03 DIAGNOSIS — M5136 Other intervertebral disc degeneration, lumbar region: Secondary | ICD-10-CM | POA: Diagnosis not present

## 2021-08-03 DIAGNOSIS — M47814 Spondylosis without myelopathy or radiculopathy, thoracic region: Secondary | ICD-10-CM | POA: Diagnosis not present

## 2021-08-05 DIAGNOSIS — G4731 Primary central sleep apnea: Secondary | ICD-10-CM | POA: Diagnosis not present

## 2021-08-08 DIAGNOSIS — S52131G Displaced fracture of neck of right radius, subsequent encounter for closed fracture with delayed healing: Secondary | ICD-10-CM | POA: Diagnosis not present

## 2021-08-08 DIAGNOSIS — S52132G Displaced fracture of neck of left radius, subsequent encounter for closed fracture with delayed healing: Secondary | ICD-10-CM | POA: Diagnosis not present

## 2021-08-08 NOTE — Progress Notes (Unsigned)
History of GERD, constipation, and abdominal pain.        Last Colonoscopy:(01/07/15) internal hemorrhoids, diverticulosis in sigmoid colon, exam otherwise normal, no biopsies.    Last Endoscopy: unsure if she has ever had one

## 2021-08-09 ENCOUNTER — Other Ambulatory Visit: Payer: Self-pay

## 2021-08-09 ENCOUNTER — Ambulatory Visit: Payer: Medicare HMO | Admitting: Gastroenterology

## 2021-08-09 ENCOUNTER — Encounter: Payer: Self-pay | Admitting: Gastroenterology

## 2021-08-09 VITALS — BP 128/80 | HR 70 | Temp 96.8°F | Ht 65.0 in | Wt 176.8 lb

## 2021-08-09 DIAGNOSIS — K219 Gastro-esophageal reflux disease without esophagitis: Secondary | ICD-10-CM | POA: Diagnosis not present

## 2021-08-09 DIAGNOSIS — K5903 Drug induced constipation: Secondary | ICD-10-CM

## 2021-08-09 NOTE — Patient Instructions (Addendum)
You can try 2 teaspoons of Benefiber daily and increase to three times a day.  ? ?Continue to drink plenty of water. ? ?You can continue the over-the-counter agents. Let me know if you would like to try a different prescription in the future! ? ?Next colonoscopy in 2026 unless clinical changes. ? ?We will see you in 6 months! ? ?I enjoyed seeing you again today! As you know, I value our relationship and want to provide genuine, compassionate, and quality care. I welcome your feedback. If you receive a survey regarding your visit,  I greatly appreciate you taking time to fill this out. See you next time! ? ?Annitta Needs, PhD, ANP-BC ?[facility]  ? ?

## 2021-08-31 DIAGNOSIS — Z79891 Long term (current) use of opiate analgesic: Secondary | ICD-10-CM | POA: Diagnosis not present

## 2021-08-31 DIAGNOSIS — M961 Postlaminectomy syndrome, not elsewhere classified: Secondary | ICD-10-CM | POA: Diagnosis not present

## 2021-08-31 DIAGNOSIS — M47812 Spondylosis without myelopathy or radiculopathy, cervical region: Secondary | ICD-10-CM | POA: Diagnosis not present

## 2021-08-31 DIAGNOSIS — G894 Chronic pain syndrome: Secondary | ICD-10-CM | POA: Diagnosis not present

## 2021-08-31 DIAGNOSIS — M546 Pain in thoracic spine: Secondary | ICD-10-CM | POA: Diagnosis not present

## 2021-08-31 DIAGNOSIS — M542 Cervicalgia: Secondary | ICD-10-CM | POA: Diagnosis not present

## 2021-08-31 DIAGNOSIS — Z79899 Other long term (current) drug therapy: Secondary | ICD-10-CM | POA: Diagnosis not present

## 2021-08-31 DIAGNOSIS — M47814 Spondylosis without myelopathy or radiculopathy, thoracic region: Secondary | ICD-10-CM | POA: Diagnosis not present

## 2021-08-31 DIAGNOSIS — M25529 Pain in unspecified elbow: Secondary | ICD-10-CM | POA: Diagnosis not present

## 2021-08-31 DIAGNOSIS — M25569 Pain in unspecified knee: Secondary | ICD-10-CM | POA: Diagnosis not present

## 2021-09-02 ENCOUNTER — Other Ambulatory Visit: Payer: Self-pay | Admitting: Physician Assistant

## 2021-09-02 DIAGNOSIS — M546 Pain in thoracic spine: Secondary | ICD-10-CM

## 2021-09-05 ENCOUNTER — Ambulatory Visit (INDEPENDENT_AMBULATORY_CARE_PROVIDER_SITE_OTHER): Payer: Medicare HMO

## 2021-09-05 ENCOUNTER — Other Ambulatory Visit: Payer: Self-pay | Admitting: Cardiology

## 2021-09-05 VITALS — Ht 65.0 in | Wt 173.0 lb

## 2021-09-05 DIAGNOSIS — Z1231 Encounter for screening mammogram for malignant neoplasm of breast: Secondary | ICD-10-CM

## 2021-09-05 DIAGNOSIS — Z0000 Encounter for general adult medical examination without abnormal findings: Secondary | ICD-10-CM | POA: Diagnosis not present

## 2021-09-05 NOTE — Patient Instructions (Signed)
Ms. Spiker , ?Thank you for taking time to come for your Medicare Wellness Visit. I appreciate your ongoing commitment to your health goals. Please review the following plan we discussed and let me know if I can assist you in the future.  ? ?Screening recommendations/referrals: ?Colonoscopy: Done 01/07/2015 Repeat in 10 years ? ?Mammogram: Done 06/22/2017. Order placed today to schedule repeat. ?Bone Density: Due at age 61. ? ?Recommended yearly ophthalmology/optometry visit for glaucoma screening and checkup ?Recommended yearly dental visit for hygiene and checkup ? ?Vaccinations: ?Influenza vaccine: Declined.  ?Pneumococcal vaccine: Due at age 13. ?Tdap vaccine: Done 04/13/2014 Repeat in 10 years ? ?Shingles vaccine: Done 09/16/2020 and 07/08/2020.  ?Covid-19: Done 03/02/2020, 08/25/2019 and 09/22/2019 ? ?Advanced directives: Advance directive discussed with you today. Pick up  a copy to complete at home and have notarized. Once this is complete please bring a copy in to our office so we can scan it into your chart. ? ?Conditions/risks identified: Aim for 30 minutes of exercise or brisk walking, 6-8 glasses of water, and 5 servings of fruits and vegetables each day. ?KEEP UP THE GOOD WORK!! ? ?Next appointment: Follow up in one year for your annual wellness visit. 2024. ? ? ?Preventive Care 40-64 Years, Female ?Preventive care refers to lifestyle choices and visits with your health care provider that can promote health and wellness. ?What does preventive care include? ?A yearly physical exam. This is also called an annual well check. ?Dental exams once or twice a year. ?Routine eye exams. Ask your health care provider how often you should have your eyes checked. ?Personal lifestyle choices, including: ?Daily care of your teeth and gums. ?Regular physical activity. ?Eating a healthy diet. ?Avoiding tobacco and drug use. ?Limiting alcohol use. ?Practicing safe sex. ?Taking low-dose aspirin daily starting at age 6. ?Taking  vitamin and mineral supplements as recommended by your health care provider. ?What happens during an annual well check? ?The services and screenings done by your health care provider during your annual well check will depend on your age, overall health, lifestyle risk factors, and family history of disease. ?Counseling  ?Your health care provider may ask you questions about your: ?Alcohol use. ?Tobacco use. ?Drug use. ?Emotional well-being. ?Home and relationship well-being. ?Sexual activity. ?Eating habits. ?Work and work Statistician. ?Method of birth control. ?Menstrual cycle. ?Pregnancy history. ?Screening  ?You may have the following tests or measurements: ?Height, weight, and BMI. ?Blood pressure. ?Lipid and cholesterol levels. These may be checked every 5 years, or more frequently if you are over 27 years old. ?Skin check. ?Lung cancer screening. You may have this screening every year starting at age 76 if you have a 30-pack-year history of smoking and currently smoke or have quit within the past 15 years. ?Fecal occult blood test (FOBT) of the stool. You may have this test every year starting at age 46. ?Flexible sigmoidoscopy or colonoscopy. You may have a sigmoidoscopy every 5 years or a colonoscopy every 10 years starting at age 33. ?Hepatitis C blood test. ?Hepatitis B blood test. ?Sexually transmitted disease (STD) testing. ?Diabetes screening. This is done by checking your blood sugar (glucose) after you have not eaten for a while (fasting). You may have this done every 1-3 years. ?Mammogram. This may be done every 1-2 years. Talk to your health care provider about when you should start having regular mammograms. This may depend on whether you have a family history of breast cancer. ?BRCA-related cancer screening. This may be done if you have a  family history of breast, ovarian, tubal, or peritoneal cancers. ?Pelvic exam and Pap test. This may be done every 3 years starting at age 25. Starting at age  37, this may be done every 5 years if you have a Pap test in combination with an HPV test. ?Bone density scan. This is done to screen for osteoporosis. You may have this scan if you are at high risk for osteoporosis. ?Discuss your test results, treatment options, and if necessary, the need for more tests with your health care provider. ?Vaccines  ?Your health care provider may recommend certain vaccines, such as: ?Influenza vaccine. This is recommended every year. ?Tetanus, diphtheria, and acellular pertussis (Tdap, Td) vaccine. You may need a Td booster every 10 years. ?Zoster vaccine. You may need this after age 42. ?Pneumococcal 13-valent conjugate (PCV13) vaccine. You may need this if you have certain conditions and were not previously vaccinated. ?Pneumococcal polysaccharide (PPSV23) vaccine. You may need one or two doses if you smoke cigarettes or if you have certain conditions. ?Talk to your health care provider about which screenings and vaccines you need and how often you need them. ?This information is not intended to replace advice given to you by your health care provider. Make sure you discuss any questions you have with your health care provider. ?Document Released: 06/11/2015 Document Revised: 02/02/2016 Document Reviewed: 03/16/2015 ?Elsevier Interactive Patient Education ? 2017 Elsevier Inc. ? ? ? ?Fall Prevention in the Home ?Falls can cause injuries. They can happen to people of all ages. There are many things you can do to make your home safe and to help prevent falls. ?What can I do on the outside of my home? ?Regularly fix the edges of walkways and driveways and fix any cracks. ?Remove anything that might make you trip as you walk through a door, such as a raised step or threshold. ?Trim any bushes or trees on the path to your home. ?Use bright outdoor lighting. ?Clear any walking paths of anything that might make someone trip, such as rocks or tools. ?Regularly check to see if handrails are  loose or broken. Make sure that both sides of any steps have handrails. ?Any raised decks and porches should have guardrails on the edges. ?Have any leaves, snow, or ice cleared regularly. ?Use sand or salt on walking paths during winter. ?Clean up any spills in your garage right away. This includes oil or grease spills. ?What can I do in the bathroom? ?Use night lights. ?Install grab bars by the toilet and in the tub and shower. Do not use towel bars as grab bars. ?Use non-skid mats or decals in the tub or shower. ?If you need to sit down in the shower, use a plastic, non-slip stool. ?Keep the floor dry. Clean up any water that spills on the floor as soon as it happens. ?Remove soap buildup in the tub or shower regularly. ?Attach bath mats securely with double-sided non-slip rug tape. ?Do not have throw rugs and other things on the floor that can make you trip. ?What can I do in the bedroom? ?Use night lights. ?Make sure that you have a light by your bed that is easy to reach. ?Do not use any sheets or blankets that are too big for your bed. They should not hang down onto the floor. ?Have a firm chair that has side arms. You can use this for support while you get dressed. ?Do not have throw rugs and other things on the floor that can make  you trip. ?What can I do in the kitchen? ?Clean up any spills right away. ?Avoid walking on wet floors. ?Keep items that you use a lot in easy-to-reach places. ?If you need to reach something above you, use a strong step stool that has a grab bar. ?Keep electrical cords out of the way. ?Do not use floor polish or wax that makes floors slippery. If you must use wax, use non-skid floor wax. ?Do not have throw rugs and other things on the floor that can make you trip. ?What can I do with my stairs? ?Do not leave any items on the stairs. ?Make sure that there are handrails on both sides of the stairs and use them. Fix handrails that are broken or loose. Make sure that handrails are as  long as the stairways. ?Check any carpeting to make sure that it is firmly attached to the stairs. Fix any carpet that is loose or worn. ?Avoid having throw rugs at the top or bottom of the stairs. If you

## 2021-09-05 NOTE — Progress Notes (Signed)
? ?Subjective:  ? Sarah Chapman is a 61 y.o. female who presents for an Initial Medicare Annual Wellness Visit. ?Virtual Visit via Telephone Note ? ?I connected with  [name] on 09/05/21 at  2:45 PM EDT by telephone and verified that I am speaking with the correct person using two identifiers. ? ?Location: ?Patient: HOME ?Provider: [facility] ?Persons participating in the virtual visit: patient/Nurse Health Advisor ?  ?I discussed the limitations, risks, security and privacy concerns of performing an evaluation and management service by telephone and the availability of in person appointments. The patient expressed understanding and agreed to proceed. ? ?Interactive audio and video telecommunications were attempted between this nurse and patient, however failed, due to patient having technical difficulties OR patient did not have access to video capability.  We continued and completed visit with audio only. ? ?Some vital signs may be absent or patient reported.  ? ?[name], LPN ? ?Review of Systems    ? ?Cardiac Risk Factors include: hypertension;sedentary lifestyle;Other (see comment), Risk factor comments: FIBROMYALGIA ? ?   ?Objective:  ?  ?Today's Vitals  ? 09/05/21 1448 09/05/21 1455  ?Weight: 173 lb (78.5 kg)   ?Height: '5\' 5"'$  (1.651 m)   ?PainSc:  2   ? ?Body mass index is 28.79 kg/m?. ? ? ?  09/05/2021  ?  3:04 PM 04/03/2014  ?  3:41 PM  ?Advanced Directives  ?Does Patient Have a Medical Advance Directive? No No  ?Would patient like information on creating a medical advance directive? No - Patient declined No - patient declined information  ? ? ?Current Medications (verified) ?Outpatient Encounter Medications as of 09/05/2021  ?Medication Sig  ? albuterol (VENTOLIN HFA) 108 (90 Base) MCG/ACT inhaler INHALE 2 PUFFS EVERY 6 HOURS AS NEEDED FOR WHEEZING OR SHORTNESS OF BREATH  ? ALPRAZolam (XANAX) 0.5 MG tablet Take 0.5 mg by mouth at bedtime as needed for anxiety.  ? Armodafinil 250 MG tablet Take 250 mg  by mouth daily.  ? aspirin 81 MG EC tablet Take 1 tablet by mouth daily.  ? BOTOX 200 units SOLR SMARTSIG:200 Unit(s) IM Once a Month  ? buPROPion (WELLBUTRIN XL) 150 MG 24 hr tablet Take 450 mg by mouth daily.  ? dexlansoprazole (DEXILANT) 60 MG capsule Take 1 capsule (60 mg total) by mouth daily.  ? ezetimibe (ZETIA) 10 MG tablet Take 1 tablet (10 mg total) by mouth daily. SCHEDULE OFFICE VISIT FOR FUTURE REFILLS.  ? Fluticasone-Umeclidin-Vilant (TRELEGY ELLIPTA) 200-62.5-25 MCG/ACT AEPB INHALE 1 PUFF ONCE A DAY  ? HYDROcodone-acetaminophen (NORCO) 10-325 MG tablet Take 1 tablet by mouth 4 (four) times daily as needed.  ? ipratropium-albuterol (DUONEB) 0.5-2.5 (3) MG/3ML SOLN Take 3 mLs by nebulization every 6 (six) hours as needed.  ? Loratadine 10 MG CAPS 1 Once A Day Oral  ? naloxone (NARCAN) nasal spray 4 mg/0.1 mL 1 SPRAY NASALLY AS NEEDED FOR ACCIDENTAL OVERDOSE  ? Nebulizers (HOMENEB WITH SIDESTREAM) MISC See admin instructions.  ? nortriptyline (PAMELOR) 50 MG capsule Take 50 mg by mouth at bedtime.  ? olmesartan (BENICAR) 40 MG tablet Take 1 tablet by mouth daily.  ? OXYCONTIN 15 MG 12 hr tablet Take 15 mg by mouth every 12 (twelve) hours.  ? pyridostigmine (MESTINON) 60 MG tablet Take 60 mg by mouth 2 (two) times daily.  ? tiZANidine (ZANAFLEX) 4 MG tablet Take by mouth.  ? Ubrogepant (UBRELVY PO) Take by mouth.  ? atorvastatin (LIPITOR) 80 MG tablet Take 1 tablet (  80 mg total) by mouth daily.  ? [DISCONTINUED] ezetimibe (ZETIA) 10 MG tablet TAKE 1 TABLET DAILY  ? [DISCONTINUED] olmesartan (BENICAR) 40 MG tablet Take 1 tablet (40 mg total) by mouth daily.  ? [DISCONTINUED] tizanidine (ZANAFLEX) 2 MG capsule Take 2 mg by mouth 3 (three) times daily.  ? ?No facility-administered encounter medications on file as of 09/05/2021.  ? ? ?Allergies (verified) ?Clindamycin/lincomycin and Penicillins  ? ?History: ?Past Medical History:  ?Diagnosis Date  ? Allergic rhinitis   ? Back pain   ? Chronic headache   ? COPD  (chronic obstructive pulmonary disease) ([place])   ? Emphysema of lung ([place])   ? Hyperlipidemia   ? Hypertension   ? Neck pain   ? Sleep apnea   ? Vaginal Pap smear, abnormal   ? ?Past Surgical History:  ?Procedure Laterality Date  ? CESAREAN SECTION    ? COLONOSCOPY N/A 01/07/2015  ? internal hemorrhoids, diverticulosis in sigmoid colon, exam otherwise normal, no biopsies.  ? GYNECOLOGIC CRYOSURGERY    ? NECK SURGERY    ? TONSILLECTOMY    ? ?Family History  ?Problem Relation Age of Onset  ? Bone cancer Mother   ? Heart attack Brother   ? Cancer Maternal Grandmother   ? Mental illness Son   ? Colon cancer Neg Hx   ? Colon polyps Neg Hx   ? ?Social History  ? ?Socioeconomic History  ? Marital status: Divorced  ?  Spouse name: Not on file  ? Number of children: Not on file  ? Years of education: Not on file  ? Highest education level: Not on file  ?Occupational History  ? Not on file  ?Tobacco Use  ? Smoking status: Former  ?  Packs/day: 1.00  ?  Years: 33.00  ?  Pack years: 33.00  ?  Types: Cigarettes  ?  Quit date: 07/25/2016  ?  Years since quitting: 5.1  ? Smokeless tobacco: Never  ?Vaping Use  ? Vaping Use: Never used  ?Substance and Sexual Activity  ? Alcohol use: No  ? Drug use: No  ? Sexual activity: Not Currently  ?  Birth control/protection: Post-menopausal  ?Other Topics Concern  ? Not on file  ?Social History Narrative  ? Not on file  ? ?Social Determinants of Health  ? ?Financial Resource Strain: Low Risk   ? Difficulty of Paying Living Expenses: Not hard at all  ?Food Insecurity: No Food Insecurity  ? Worried About Charity fundraiser in the Last Year: Never true  ? Ran Out of Food in the Last Year: Never true  ?Transportation Needs: Unknown  ? Lack of Transportation (Medical): No  ? Lack of Transportation (Non-Medical): Not on file  ?Physical Activity: Insufficiently Active  ? Days of Exercise per Week: 3 days  ? Minutes of Exercise per Session: 30 min  ?Stress: No Stress Concern Present  ? Feeling of  Stress : Not at all  ?Social Connections: Moderately Integrated  ? Frequency of Communication with Friends and Family: More than three times a week  ? Frequency of Social Gatherings with Friends and Family: More than three times a week  ? Attends Religious Services: 1 to 4 times per year  ? Active Member of Clubs or Organizations: Yes  ? Attends Archivist Meetings: More than 4 times per year  ? Marital Status: Divorced  ? ? ?Tobacco Counseling ?Counseling given: Not Answered ? ? ?Clinical Intake: ? ?Pre-visit preparation completed: Yes ? ?Pain :  0-10 ?Pain Score: 2  ?Pain Type: Chronic pain ?Pain Location: Back ?Pain Onset: More than a month ago ?Pain Frequency: Intermittent ? ?  ? ?BMI - recorded: 28.79 ?Nutritional Status: BMI 25 -29 Overweight ?Nutritional Risks: None ?Diabetes: No ? ?How often do you need to have someone help you when you [place] instructions, pamphlets, or other written materials from your doctor or pharmacy?: 1 - Never ? ?Diabetic?NO ? ?Interpreter Needed?: No ? ?Information entered by :: mj Talyn Eddie, lpn ? ? ?Activities of Daily Living ? ?  09/05/2021  ?  3:07 PM  ?In your present state of health, do you have any difficulty performing the following activities:  ?Hearing? 0  ?Vision? 0  ?Difficulty concentrating or making decisions? 1  ?Comment Neurology consult, recently.  ?Walking or climbing stairs? 0  ?Dressing or bathing? 0  ?Doing errands, shopping? 0  ?Preparing Food and eating ? N  ?Using the Toilet? N  ?In the past six months, have you accidently leaked urine? N  ?Do you have problems with loss of bowel control? N  ?Managing your Medications? N  ?Managing your Finances? N  ?Housekeeping or managing your Housekeeping? N  ? ? ?Patient Care Team: ?Claretta Fraise, MD as PCP - General (Family Medicine) ?Donato Heinz, MD as PCP - Cardiology (Cardiology) ?Fields, Marga Melnick, MD (Inactive) as Consulting Physician (Gastroenterology) ?Almyra Deforest, [place] (Cardiology) ? ?Indicate any  recent Medical Services you may have received from other than [facility] providers in the past year (date may be approximate). ? ?   ?Assessment:  ? This is a routine wellness examination for [place]. ? ?Hearing/Vision

## 2021-09-07 ENCOUNTER — Telehealth: Payer: Self-pay | Admitting: Cardiology

## 2021-09-07 NOTE — Telephone Encounter (Signed)
STAT if patient feels like he/she is going to faint  ? ?Are you dizzy now? A little lightheaded ? ?Do you feel faint or have you passed out? no ? ?Do you have any other symptoms? Has COPD, heavier breathing but pulmonary seemed to think it was cardiac related, not SOB now ? ?Have you checked your HR and BP (record if available)? normal ? ? ?Patient states she has been having dizziness for weeks. She says it comes and goes, but is worse when she bends down.  ?

## 2021-09-07 NOTE — Telephone Encounter (Signed)
Spoke with pt, she reports she has had dizziness for some time now. She notices it when squatting and when standing from sitting. Her blood pressure prior to me calling was 133/77/88. She has never checked her blood pressure when dizzy. She also reports her SOB is unchanged and the lung doctor told her that her lungs were fine and to come back and see the heart doctor. She has a follow up appointment 09/28/21 and is fine with that appointment. She will track her blood pressure when dizzy and bring list and blood pressure cuff to the follow up appointment. She will call prior to the appointment with concerns.  ?
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
## 2021-09-28 ENCOUNTER — Ambulatory Visit: Payer: Medicare HMO | Admitting: Physician Assistant

## 2021-09-28 ENCOUNTER — Encounter: Payer: Self-pay | Admitting: Physician Assistant

## 2021-09-28 VITALS — BP 108/78 | HR 87 | Ht 65.0 in | Wt 170.0 lb

## 2021-09-28 DIAGNOSIS — R42 Dizziness and giddiness: Secondary | ICD-10-CM | POA: Diagnosis not present

## 2021-09-28 DIAGNOSIS — I10 Essential (primary) hypertension: Secondary | ICD-10-CM | POA: Diagnosis not present

## 2021-09-28 DIAGNOSIS — I2510 Atherosclerotic heart disease of native coronary artery without angina pectoris: Secondary | ICD-10-CM

## 2021-09-28 DIAGNOSIS — E785 Hyperlipidemia, unspecified: Secondary | ICD-10-CM

## 2021-09-28 DIAGNOSIS — I6523 Occlusion and stenosis of bilateral carotid arteries: Secondary | ICD-10-CM | POA: Diagnosis not present

## 2021-09-28 NOTE — Patient Instructions (Signed)
Medication Instructions:  ?No Changes ?*If you need a refill on your cardiac medications before your next appointment, please call your pharmacy* ? ? ?Lab Work: ?No Labs ?If you have labs (blood work) drawn today and your tests are completely normal, you will receive your results only by: ?MyChart Message (if you have MyChart) OR ?A paper copy in the mail ?If you have any lab test that is abnormal or we need to change your treatment, we will call you to review the results. ? ? ?Testing/Procedures: ?No Testing ? ? ?Follow-Up: ?At [facility], you and your health needs are our priority.  As part of our continuing mission to provide you with exceptional heart care, we have created designated Provider Care Teams.  These Care Teams include your primary Cardiologist (physician) and Advanced Practice Providers (APPs -  Physician Assistants and Nurse Practitioners) who all work together to provide you with the care you need, when you need it. ? ?We recommend signing up for the patient portal called "MyChart".  Sign up information is provided on this After Visit Summary.  MyChart is used to connect with patients for Virtual Visits (Telemedicine).  Patients are able to view lab/test results, encounter notes, upcoming appointments, etc.  Non-urgent messages can be sent to your provider as well.   ?To learn more about what you can do with MyChart, go to [email or web address].   ? ?Your next appointment:   ?3 month(s) ? ?The format for your next appointment:   ?In Person ? ?Provider:   ?Donato Heinz, MD   ? ? ? ? ?Important Information About Sugar ? ? ? ? ?  ?

## 2021-10-03 ENCOUNTER — Ambulatory Visit
Admission: RE | Admit: 2021-10-03 | Discharge: 2021-10-03 | Disposition: A | Discharge status: Home / Self Care | Payer: Medicare HMO | Source: Ambulatory Visit | Attending: Physician Assistant | Admitting: Physician Assistant

## 2021-10-03 DIAGNOSIS — M546 Pain in thoracic spine: Secondary | ICD-10-CM

## 2021-10-03 DIAGNOSIS — M5124 Other intervertebral disc displacement, thoracic region: Secondary | ICD-10-CM | POA: Diagnosis not present

## 2021-10-03 DIAGNOSIS — M47814 Spondylosis without myelopathy or radiculopathy, thoracic region: Secondary | ICD-10-CM | POA: Diagnosis not present

## 2021-10-03 MED ORDER — GADOBENATE DIMEGLUMINE 529 MG/ML IV SOLN
15.0000 mL | Freq: Once | INTRAVENOUS | Status: AC | PRN
  Administered 2021-10-03: 15 mL via INTRAVENOUS

## 2021-10-04 ENCOUNTER — Other Ambulatory Visit: Payer: Medicare HMO

## 2021-10-04 DIAGNOSIS — R5382 Chronic fatigue, unspecified: Secondary | ICD-10-CM | POA: Diagnosis not present

## 2021-10-04 DIAGNOSIS — G43719 Chronic migraine without aura, intractable, without status migrainosus: Secondary | ICD-10-CM | POA: Diagnosis not present

## 2021-10-05 ENCOUNTER — Other Ambulatory Visit: Payer: Self-pay | Admitting: Family Medicine

## 2021-10-05 DIAGNOSIS — G894 Chronic pain syndrome: Secondary | ICD-10-CM | POA: Diagnosis not present

## 2021-10-05 DIAGNOSIS — M25529 Pain in unspecified elbow: Secondary | ICD-10-CM | POA: Diagnosis not present

## 2021-10-05 DIAGNOSIS — Z79899 Other long term (current) drug therapy: Secondary | ICD-10-CM | POA: Diagnosis not present

## 2021-10-05 DIAGNOSIS — M961 Postlaminectomy syndrome, not elsewhere classified: Secondary | ICD-10-CM | POA: Diagnosis not present

## 2021-10-05 DIAGNOSIS — M25569 Pain in unspecified knee: Secondary | ICD-10-CM | POA: Diagnosis not present

## 2021-10-05 DIAGNOSIS — M47812 Spondylosis without myelopathy or radiculopathy, cervical region: Secondary | ICD-10-CM | POA: Diagnosis not present

## 2021-10-05 DIAGNOSIS — M546 Pain in thoracic spine: Secondary | ICD-10-CM | POA: Diagnosis not present

## 2021-10-05 DIAGNOSIS — Z79891 Long term (current) use of opiate analgesic: Secondary | ICD-10-CM | POA: Diagnosis not present

## 2021-10-05 DIAGNOSIS — M797 Fibromyalgia: Secondary | ICD-10-CM | POA: Diagnosis not present

## 2021-10-05 DIAGNOSIS — M5136 Other intervertebral disc degeneration, lumbar region: Secondary | ICD-10-CM | POA: Diagnosis not present

## 2021-10-05 DIAGNOSIS — M5134 Other intervertebral disc degeneration, thoracic region: Secondary | ICD-10-CM | POA: Diagnosis not present

## 2021-10-05 DIAGNOSIS — M47814 Spondylosis without myelopathy or radiculopathy, thoracic region: Secondary | ICD-10-CM | POA: Diagnosis not present

## 2021-10-05 DIAGNOSIS — M542 Cervicalgia: Secondary | ICD-10-CM | POA: Diagnosis not present

## 2021-10-11 DIAGNOSIS — H02831 Dermatochalasis of right upper eyelid: Secondary | ICD-10-CM | POA: Diagnosis not present

## 2021-10-11 DIAGNOSIS — H0520 Unspecified exophthalmos: Secondary | ICD-10-CM | POA: Diagnosis not present

## 2021-10-11 DIAGNOSIS — H43813 Vitreous degeneration, bilateral: Secondary | ICD-10-CM | POA: Diagnosis not present

## 2021-10-11 DIAGNOSIS — H527 Unspecified disorder of refraction: Secondary | ICD-10-CM | POA: Diagnosis not present

## 2021-10-11 DIAGNOSIS — H40003 Preglaucoma, unspecified, bilateral: Secondary | ICD-10-CM | POA: Diagnosis not present

## 2021-10-11 DIAGNOSIS — H25813 Combined forms of age-related cataract, bilateral: Secondary | ICD-10-CM | POA: Diagnosis not present

## 2021-10-11 DIAGNOSIS — H02834 Dermatochalasis of left upper eyelid: Secondary | ICD-10-CM | POA: Diagnosis not present

## 2021-10-19 DIAGNOSIS — F900 Attention-deficit hyperactivity disorder, predominantly inattentive type: Secondary | ICD-10-CM | POA: Diagnosis not present

## 2021-10-19 DIAGNOSIS — G8921 Chronic pain due to trauma: Secondary | ICD-10-CM | POA: Diagnosis not present

## 2021-10-19 DIAGNOSIS — F339 Major depressive disorder, recurrent, unspecified: Secondary | ICD-10-CM | POA: Diagnosis not present

## 2021-10-19 DIAGNOSIS — F411 Generalized anxiety disorder: Secondary | ICD-10-CM | POA: Diagnosis not present

## 2021-10-19 DIAGNOSIS — R69 Illness, unspecified: Secondary | ICD-10-CM | POA: Diagnosis not present

## 2021-10-25 DIAGNOSIS — M791 Myalgia, unspecified site: Secondary | ICD-10-CM | POA: Diagnosis not present

## 2021-11-03 ENCOUNTER — Other Ambulatory Visit: Payer: Self-pay | Admitting: Family Medicine

## 2021-11-04 ENCOUNTER — Encounter: Payer: Self-pay | Admitting: Family Medicine

## 2021-11-04 NOTE — Telephone Encounter (Signed)
Letter sent.

## 2021-11-04 NOTE — Telephone Encounter (Signed)
Stacks. NTBS 30 days given 10/05/21

## 2021-11-09 DIAGNOSIS — M5136 Other intervertebral disc degeneration, lumbar region: Secondary | ICD-10-CM | POA: Diagnosis not present

## 2021-11-09 DIAGNOSIS — M25529 Pain in unspecified elbow: Secondary | ICD-10-CM | POA: Diagnosis not present

## 2021-11-09 DIAGNOSIS — G894 Chronic pain syndrome: Secondary | ICD-10-CM | POA: Diagnosis not present

## 2021-11-09 DIAGNOSIS — M25569 Pain in unspecified knee: Secondary | ICD-10-CM | POA: Diagnosis not present

## 2021-11-09 DIAGNOSIS — M5134 Other intervertebral disc degeneration, thoracic region: Secondary | ICD-10-CM | POA: Diagnosis not present

## 2021-11-09 DIAGNOSIS — M961 Postlaminectomy syndrome, not elsewhere classified: Secondary | ICD-10-CM | POA: Diagnosis not present

## 2021-11-09 DIAGNOSIS — Z79899 Other long term (current) drug therapy: Secondary | ICD-10-CM | POA: Diagnosis not present

## 2021-11-09 DIAGNOSIS — M791 Myalgia, unspecified site: Secondary | ICD-10-CM | POA: Diagnosis not present

## 2021-11-09 DIAGNOSIS — M47812 Spondylosis without myelopathy or radiculopathy, cervical region: Secondary | ICD-10-CM | POA: Diagnosis not present

## 2021-11-09 DIAGNOSIS — M47814 Spondylosis without myelopathy or radiculopathy, thoracic region: Secondary | ICD-10-CM | POA: Diagnosis not present

## 2021-11-09 DIAGNOSIS — M797 Fibromyalgia: Secondary | ICD-10-CM | POA: Diagnosis not present

## 2021-11-09 DIAGNOSIS — M546 Pain in thoracic spine: Secondary | ICD-10-CM | POA: Diagnosis not present

## 2021-11-18 ENCOUNTER — Other Ambulatory Visit: Payer: Self-pay | Admitting: Cardiology

## 2021-11-30 ENCOUNTER — Ambulatory Visit
Admission: RE | Admit: 2021-11-30 | Discharge: 2021-11-30 | Disposition: A | Discharge status: Home / Self Care | Payer: Medicare HMO | Source: Ambulatory Visit | Attending: Family Medicine | Admitting: Family Medicine

## 2021-11-30 DIAGNOSIS — Z1231 Encounter for screening mammogram for malignant neoplasm of breast: Secondary | ICD-10-CM

## 2021-12-07 DIAGNOSIS — M5134 Other intervertebral disc degeneration, thoracic region: Secondary | ICD-10-CM | POA: Diagnosis not present

## 2021-12-07 DIAGNOSIS — M25569 Pain in unspecified knee: Secondary | ICD-10-CM | POA: Diagnosis not present

## 2021-12-07 DIAGNOSIS — M47814 Spondylosis without myelopathy or radiculopathy, thoracic region: Secondary | ICD-10-CM | POA: Diagnosis not present

## 2021-12-07 DIAGNOSIS — M5136 Other intervertebral disc degeneration, lumbar region: Secondary | ICD-10-CM | POA: Diagnosis not present

## 2021-12-07 DIAGNOSIS — M47812 Spondylosis without myelopathy or radiculopathy, cervical region: Secondary | ICD-10-CM | POA: Diagnosis not present

## 2021-12-07 DIAGNOSIS — G894 Chronic pain syndrome: Secondary | ICD-10-CM | POA: Diagnosis not present

## 2021-12-07 DIAGNOSIS — M25529 Pain in unspecified elbow: Secondary | ICD-10-CM | POA: Diagnosis not present

## 2021-12-07 DIAGNOSIS — M961 Postlaminectomy syndrome, not elsewhere classified: Secondary | ICD-10-CM | POA: Diagnosis not present

## 2021-12-07 DIAGNOSIS — M797 Fibromyalgia: Secondary | ICD-10-CM | POA: Diagnosis not present

## 2021-12-07 DIAGNOSIS — H5213 Myopia, bilateral: Secondary | ICD-10-CM | POA: Diagnosis not present

## 2021-12-07 DIAGNOSIS — M546 Pain in thoracic spine: Secondary | ICD-10-CM | POA: Diagnosis not present

## 2021-12-07 DIAGNOSIS — Z79899 Other long term (current) drug therapy: Secondary | ICD-10-CM | POA: Diagnosis not present

## 2021-12-07 DIAGNOSIS — M542 Cervicalgia: Secondary | ICD-10-CM | POA: Diagnosis not present

## 2021-12-29 ENCOUNTER — Encounter: Payer: Self-pay | Admitting: Family Medicine

## 2021-12-29 ENCOUNTER — Ambulatory Visit (INDEPENDENT_AMBULATORY_CARE_PROVIDER_SITE_OTHER): Payer: Medicare HMO | Admitting: Family Medicine

## 2021-12-29 VITALS — BP 133/62 | HR 95 | Temp 98.1°F | Ht 65.0 in | Wt 170.8 lb

## 2021-12-29 DIAGNOSIS — Z13220 Encounter for screening for lipoid disorders: Secondary | ICD-10-CM

## 2021-12-29 DIAGNOSIS — I10 Essential (primary) hypertension: Secondary | ICD-10-CM | POA: Diagnosis not present

## 2021-12-29 MED ORDER — ESCITALOPRAM OXALATE 10 MG PO TABS: 10.0000 mg | ORAL_TABLET | Freq: Every day | ORAL | 1 refills | Status: DC

## 2021-12-29 MED ORDER — ATORVASTATIN CALCIUM 80 MG PO TABS
80.0000 mg | ORAL_TABLET | Freq: Every day | ORAL | 3 refills | Status: DC
Start: 2021-12-29 — End: 2022-07-03

## 2021-12-29 MED ORDER — DEXLANSOPRAZOLE 60 MG PO CPDR
60.0000 mg | DELAYED_RELEASE_CAPSULE | Freq: Every day | ORAL | 3 refills | Status: DC
Start: 2021-12-29 — End: 2022-02-07

## 2021-12-29 NOTE — Progress Notes (Signed)
Subjective:  Patient ID: Sarah Chapman, female    DOB: Mar 22, 1961  Age: 61 y.o. MRN: [id number]  CC: Medical Management of Chronic Issues   HPI Sarah Chapman presents for  presents for  feeling depressed. Fatigued.     12/29/2021    3:01 PM 12/29/2021    2:57 PM 09/05/2021    3:01 PM 03/11/2021    2:36 PM 02/07/2021    4:19 PM  Depression screen PHQ 2/9  Decreased Interest 2 0 0 2 1  Down, Depressed, Hopeless 2 0 '1 2 1  ' PHQ - 2 Score 4 0 '1 4 2  ' Altered sleeping 0   2 2  Tired, decreased energy '2   2 2  ' Change in appetite 1   0 3  Feeling bad or failure about yourself  1   0 0  Trouble concentrating '2   2 2  ' Moving slowly or fidgety/restless '1   2 1  ' Suicidal thoughts 0   0 0  PHQ-9 Score '11   12 12  ' Difficult doing work/chores Somewhat difficult   Very difficult Somewhat difficult     follow-up of hypertension. Patient has no history of headache chest pain or shortness of breath or recent cough. Patient also denies symptoms of TIA such as focal numbness or weakness. Patient denies side effects from medication. States taking it regularly.  Patient in for follow-up of GERD. Currently asymptomatic taking  PPI daily. There is no chest pain or heartburn. No hematemesis and no melena. No dysphagia or choking. Onset is remote. Progression is stable. Complicating factors, none.   in for follow-up of elevated cholesterol. Doing well without complaints on current medication. Denies side effects of statin including myalgia and arthralgia and nausea. Currently no chest pain, shortness of breath or other cardiovascular related symptoms noted.  Breathing is        12/29/2021    3:01 PM 12/29/2021    2:57 PM 09/05/2021    3:01 PM  Depression screen PHQ 2/9  Decreased Interest 2 0 0  Down, Depressed, Hopeless 2 0 1  PHQ - 2 Score 4 0 1  Altered sleeping 0    Tired, decreased energy 2    Change in appetite 1    Feeling bad or failure about yourself  1    Trouble concentrating 2    Moving  slowly or fidgety/restless 1    Suicidal thoughts 0    PHQ-9 Score 11    Difficult doing work/chores Somewhat difficult      History Sarah Chapman has a past medical history of Allergic rhinitis, Back pain, Chronic headache, COPD (chronic obstructive pulmonary disease) ([place]), Emphysema of lung ([place]), Hyperlipidemia, Hypertension, Neck pain, Sleep apnea, and Vaginal Pap smear, abnormal.   She has a past surgical history that includes Neck surgery; Tonsillectomy; Cesarean section; Gynecologic cryosurgery; and Colonoscopy (N/A, 01/07/2015).   Her family history includes Bone cancer in her mother; Cancer in her maternal grandmother; Heart attack in her brother; Mental illness in her son.She reports that she quit smoking about 5 years ago. Her smoking use included cigarettes. She has a 33.00 pack-year smoking history. She has never used smokeless tobacco. She reports that she does not drink alcohol and does not use drugs.    ROS Review of Systems  Constitutional:  Positive for fatigue.  HENT: Negative.    Eyes:  Negative for visual disturbance.  Respiratory:  Negative for shortness of breath.   Cardiovascular:  Negative for chest pain.  Gastrointestinal:  Negative for abdominal pain.  Musculoskeletal:  Negative for arthralgias.    Objective:  BP 133/62   Pulse 95   Temp 98.1 F (36.7 C)   Ht '5\' 5"'  (1.651 m)   Wt 170 lb 12.8 oz (77.5 kg)   SpO2 96%   BMI 28.42 kg/m   BP Readings from Last 3 Encounters:  12/29/21 133/62  09/28/21 108/78  08/09/21 128/80    Wt Readings from Last 3 Encounters:  12/29/21 170 lb 12.8 oz (77.5 kg)  09/28/21 170 lb (77.1 kg)  09/05/21 173 lb (78.5 kg)     Physical Exam Constitutional:      General: She is not in acute distress.    Appearance: She is well-developed.  Cardiovascular:     Rate and Rhythm: Normal rate and regular rhythm.  Pulmonary:     Breath sounds: Normal breath sounds.  Musculoskeletal:        General: Normal range of motion.   Skin:    General: Skin is warm and dry.  Neurological:     Mental Status: She is alert and oriented to person, place, and time.       Assessment & Plan:   Sarah Chapman was seen today for medical management of chronic issues.  Diagnoses and all orders for this visit:  Essential hypertension -     CBC with Differential/Platelet -     CMP14+EGFR  Lipid screening -     Lipid panel  Other orders -     atorvastatin (LIPITOR) 80 MG tablet; Take 1 tablet (80 mg total) by mouth daily. -     dexlansoprazole (DEXILANT) 60 MG capsule; Take 1 capsule (60 mg total) by mouth daily. -     escitalopram (LEXAPRO) 10 MG tablet; Take 1 tablet (10 mg total) by mouth daily.       I am having Sarah Chapman start on escitalopram. I am also having her maintain her aspirin EC, buPROPion, nortriptyline, Ubrogepant (UBRELVY PO), Armodafinil, ipratropium-albuterol, Fluticasone-Umeclidin-Vilant, HYDROcodone-acetaminophen, HomeNeb with Sidestream, OxyCONTIN, pyridostigmine, Loratadine, naloxone, olmesartan, Botox, tiZANidine, albuterol, ezetimibe, atorvastatin, and dexlansoprazole.  Allergies as of 12/29/2021       Reactions   Clindamycin/lincomycin Rash   Penicillins    Unknown        Medication List        Accurate as of December 29, 2021  3:25 PM. If you have any questions, ask your nurse or doctor.          albuterol 108 (90 Base) MCG/ACT inhaler Commonly known as: VENTOLIN HFA Inhale 2 puffs into the lungs every 6 (six) hours as needed for wheezing or shortness of breath. (NEEDS TO BE SEEN BEFORE NEXT REFILL)   Armodafinil 250 MG tablet Take 250 mg by mouth daily.   aspirin EC 81 MG tablet Take 1 tablet by mouth daily.   atorvastatin 80 MG tablet Commonly known as: LIPITOR Take 1 tablet (80 mg total) by mouth daily.   Botox 200 units injection Generic drug: botulinum toxin Type A SMARTSIG:200 Unit(s) IM Once a Month   buPROPion 150 MG 24 hr tablet Commonly known as: WELLBUTRIN  XL Take 450 mg by mouth daily.   dexlansoprazole 60 MG capsule Commonly known as: Dexilant Take 1 capsule (60 mg total) by mouth daily.   escitalopram 10 MG tablet Commonly known as: LEXAPRO Take 1 tablet (10 mg total) by mouth daily. Started by: Claretta Fraise, MD   ezetimibe 10 MG tablet Commonly known as: ZETIA TAKE 1  TABLET DAILY   Fluticasone-Umeclidin-Vilant 200-62.5-25 MCG/ACT Aepb Commonly known as: Trelegy Ellipta INHALE 1 PUFF ONCE A DAY   HomeNeb with Sidestream Misc See admin instructions.   HYDROcodone-acetaminophen 10-325 MG tablet Commonly known as: NORCO Take 1 tablet by mouth 4 (four) times daily as needed.   ipratropium-albuterol 0.5-2.5 (3) MG/3ML Soln Commonly known as: DUONEB Take 3 mLs by nebulization every 6 (six) hours as needed.   Loratadine 10 MG Caps 1 Once A Day Oral   naloxone 4 MG/0.1ML Liqd nasal spray kit Commonly known as: NARCAN 1 SPRAY NASALLY AS NEEDED FOR ACCIDENTAL OVERDOSE   nortriptyline 50 MG capsule Commonly known as: PAMELOR Take 50 mg by mouth at bedtime.   olmesartan 40 MG tablet Commonly known as: BENICAR Take 1 tablet by mouth daily.   OxyCONTIN 15 mg 12 hr tablet Generic drug: oxyCODONE Take 15 mg by mouth every 12 (twelve) hours.   pyridostigmine 60 MG tablet Commonly known as: MESTINON Take 60 mg by mouth 2 (two) times daily.   tiZANidine 4 MG tablet Commonly known as: ZANAFLEX Take by mouth.   UBRELVY PO Take by mouth.         Follow-up: Return in about 6 months (around 07/01/2022).  Claretta Fraise, M.D.

## 2022-01-02 ENCOUNTER — Other Ambulatory Visit: Payer: Self-pay | Admitting: Family Medicine

## 2022-01-03 IMAGING — DX DG CHEST 2V
2 series · 2 of 2 positions shown · non-contrast
Comparison: 05/07/2018

CLINICAL DATA: Former smoker, emphysema

EXAM:
CHEST - 2 VIEW

[chest pa]
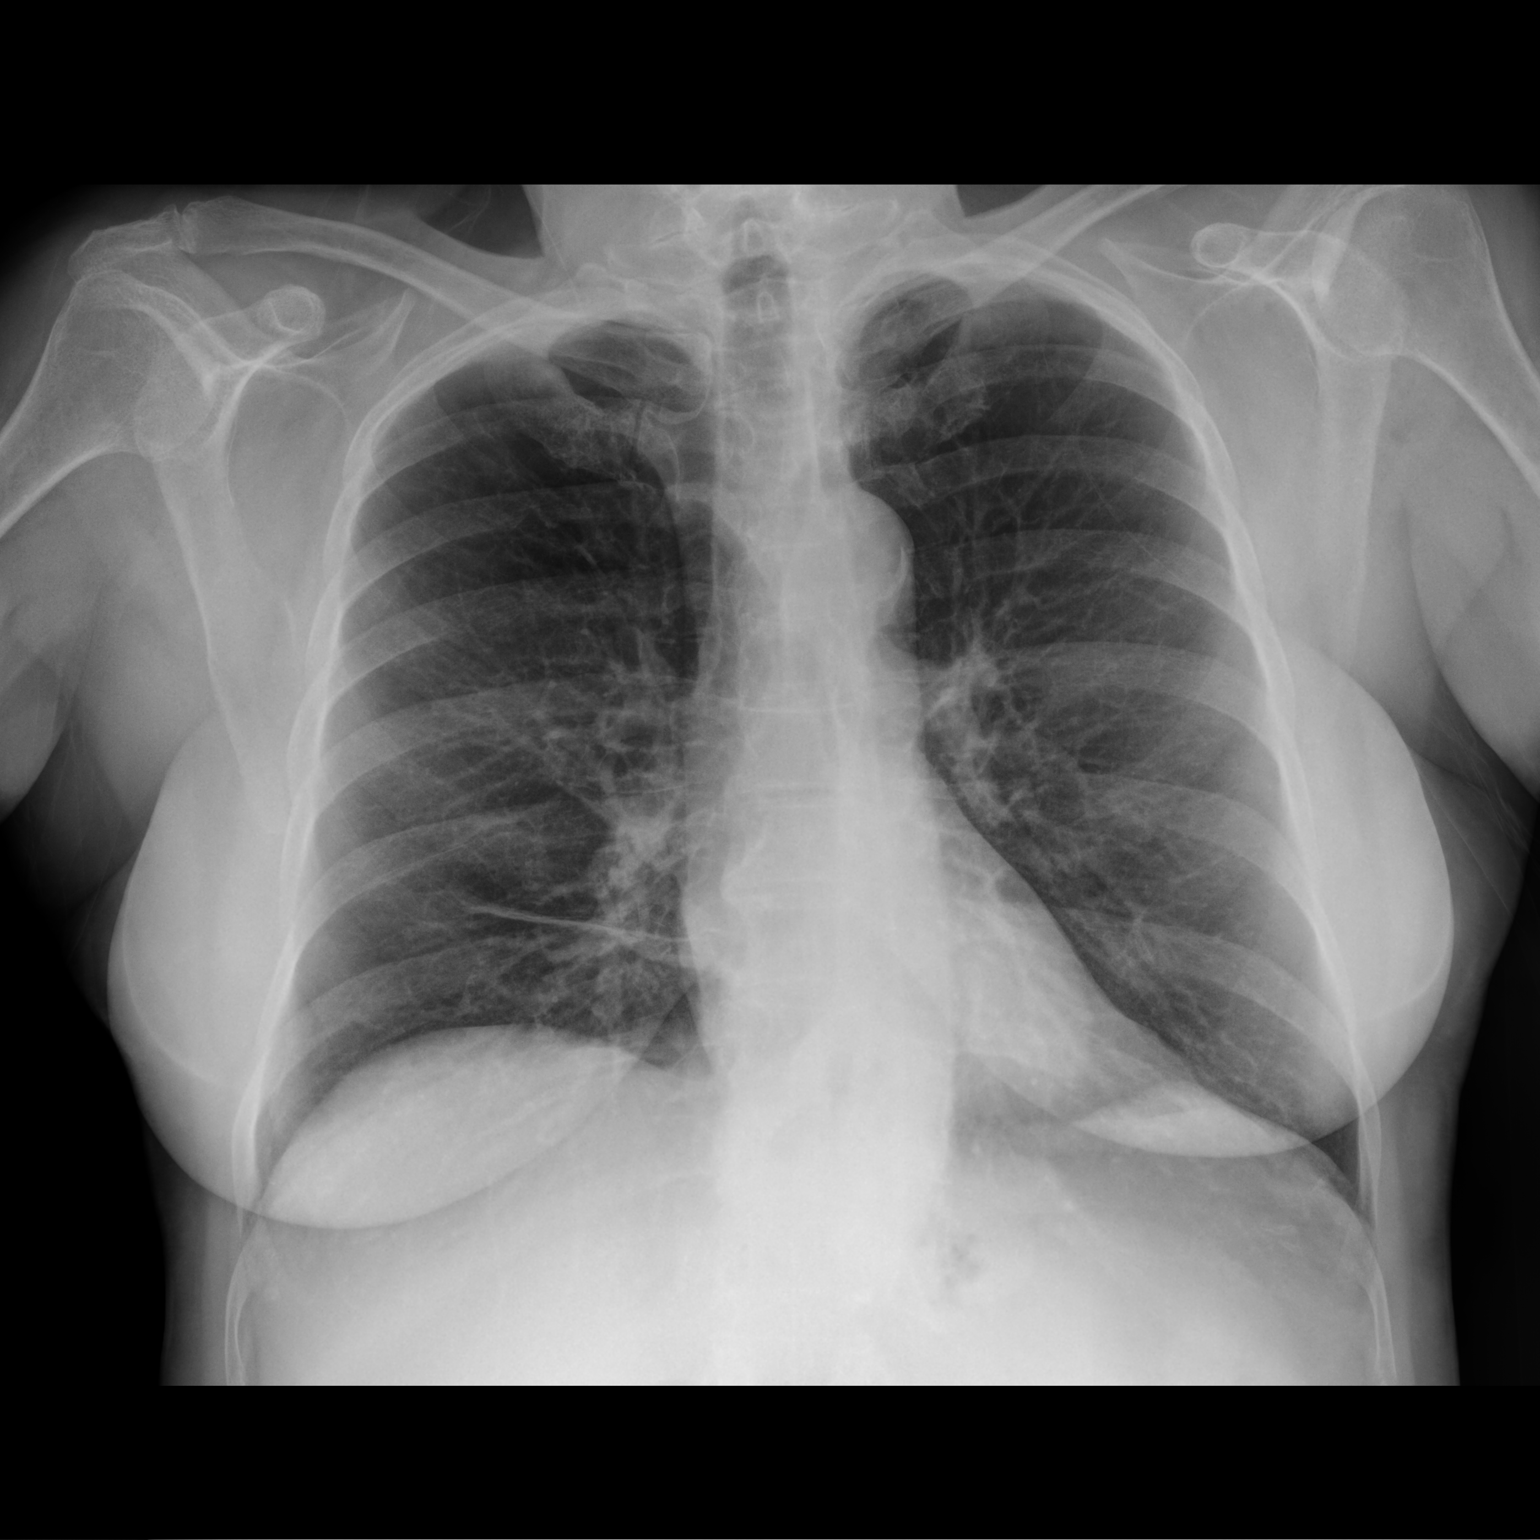

[chest lat]
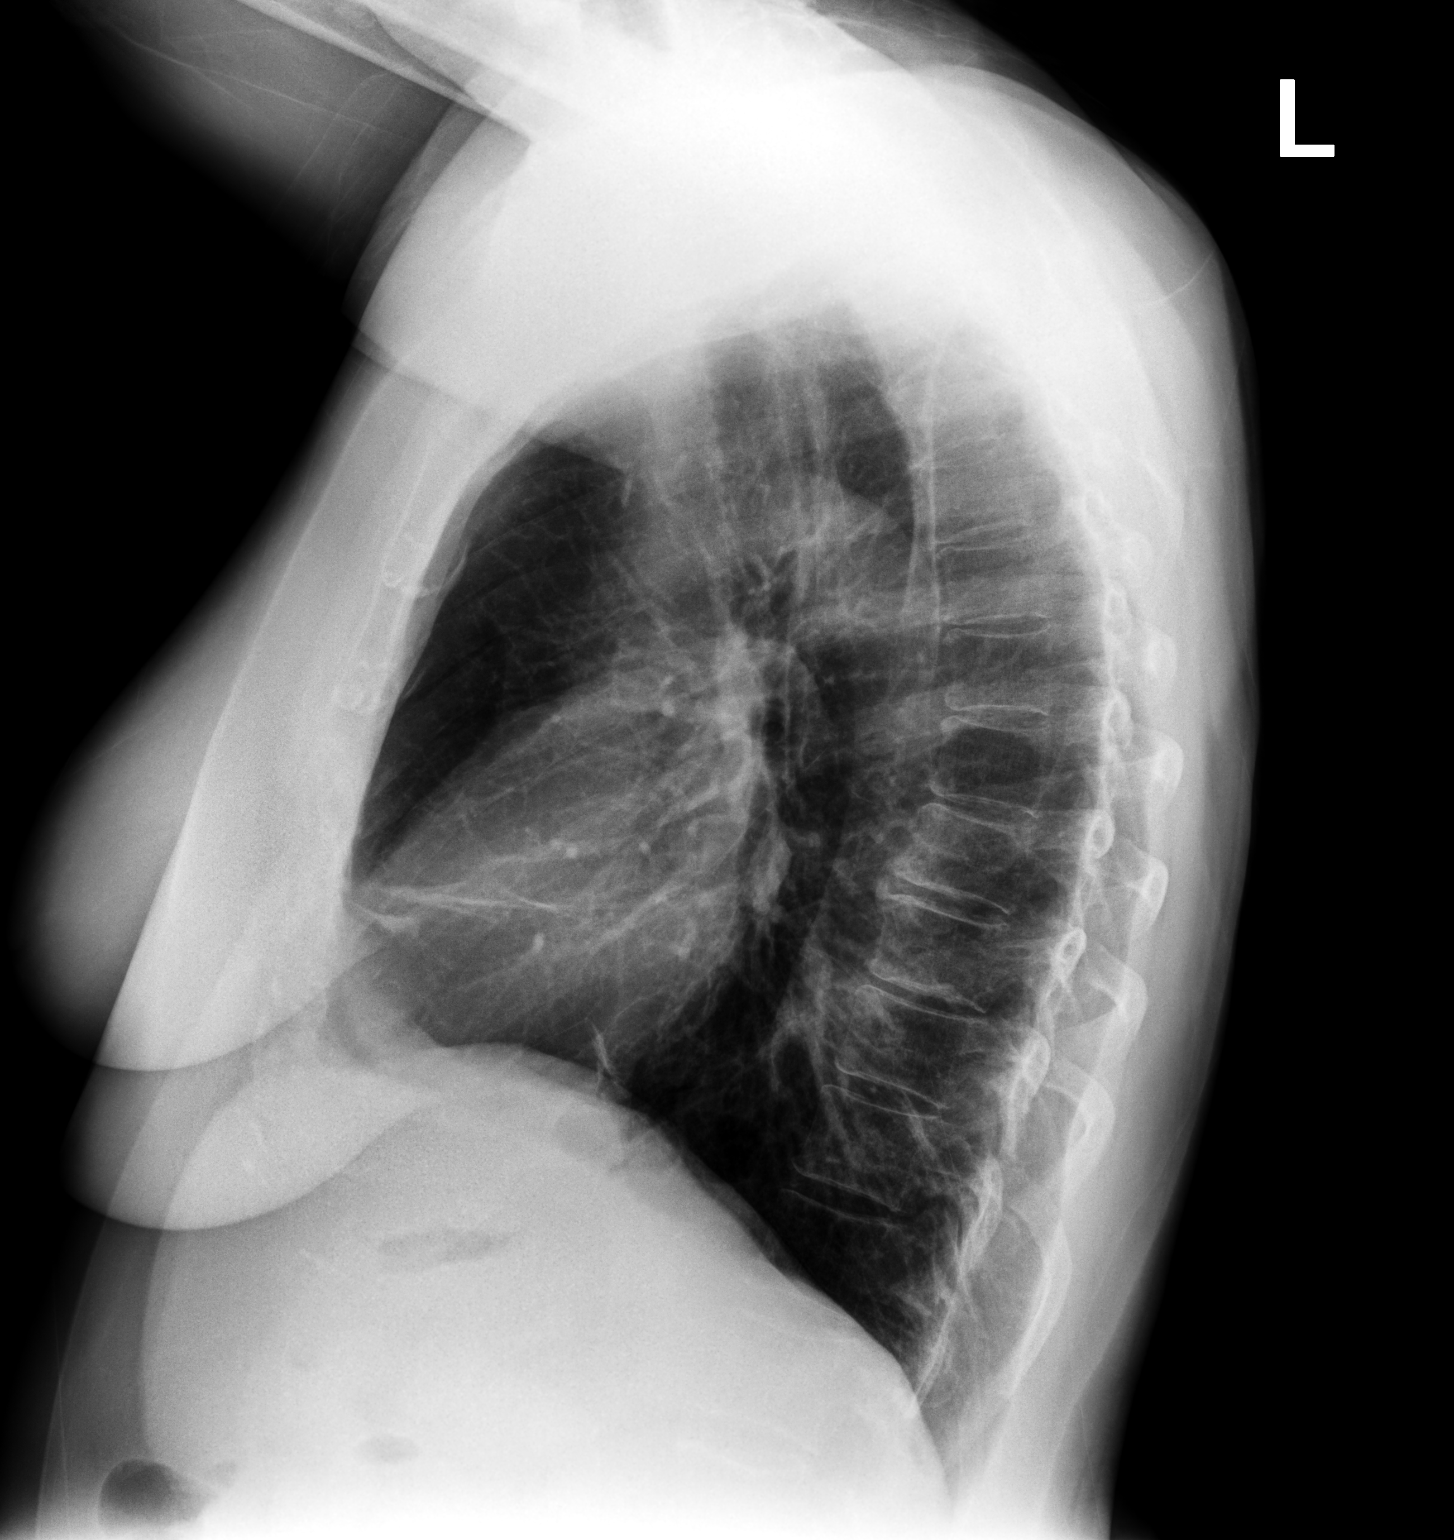

[2 of 2 positions shown; findings below may reference images not displayed]

FINDINGS: Linear scarring or atelectasis in the right middle lobe. No
consolidation or effusion. Normal heart size. Aortic
atherosclerosis. No pneumothorax.
IMPRESSION: No active cardiopulmonary disease. Linear scarring or atelectasis in
the right middle lobe.

## 2022-01-04 DIAGNOSIS — G894 Chronic pain syndrome: Secondary | ICD-10-CM | POA: Diagnosis not present

## 2022-01-04 DIAGNOSIS — M5134 Other intervertebral disc degeneration, thoracic region: Secondary | ICD-10-CM | POA: Diagnosis not present

## 2022-01-04 DIAGNOSIS — M47814 Spondylosis without myelopathy or radiculopathy, thoracic region: Secondary | ICD-10-CM | POA: Diagnosis not present

## 2022-01-04 DIAGNOSIS — M25529 Pain in unspecified elbow: Secondary | ICD-10-CM | POA: Diagnosis not present

## 2022-01-04 DIAGNOSIS — M25569 Pain in unspecified knee: Secondary | ICD-10-CM | POA: Diagnosis not present

## 2022-01-04 DIAGNOSIS — M546 Pain in thoracic spine: Secondary | ICD-10-CM | POA: Diagnosis not present

## 2022-01-04 DIAGNOSIS — M47812 Spondylosis without myelopathy or radiculopathy, cervical region: Secondary | ICD-10-CM | POA: Diagnosis not present

## 2022-01-04 DIAGNOSIS — M961 Postlaminectomy syndrome, not elsewhere classified: Secondary | ICD-10-CM | POA: Diagnosis not present

## 2022-01-04 DIAGNOSIS — M5136 Other intervertebral disc degeneration, lumbar region: Secondary | ICD-10-CM | POA: Diagnosis not present

## 2022-01-04 DIAGNOSIS — Z79891 Long term (current) use of opiate analgesic: Secondary | ICD-10-CM | POA: Diagnosis not present

## 2022-01-04 DIAGNOSIS — M797 Fibromyalgia: Secondary | ICD-10-CM | POA: Diagnosis not present

## 2022-01-04 DIAGNOSIS — Z79899 Other long term (current) drug therapy: Secondary | ICD-10-CM | POA: Diagnosis not present

## 2022-01-11 DIAGNOSIS — R69 Illness, unspecified: Secondary | ICD-10-CM | POA: Diagnosis not present

## 2022-01-11 DIAGNOSIS — F900 Attention-deficit hyperactivity disorder, predominantly inattentive type: Secondary | ICD-10-CM | POA: Diagnosis not present

## 2022-01-11 DIAGNOSIS — F411 Generalized anxiety disorder: Secondary | ICD-10-CM | POA: Diagnosis not present

## 2022-01-11 DIAGNOSIS — F339 Major depressive disorder, recurrent, unspecified: Secondary | ICD-10-CM | POA: Diagnosis not present

## 2022-01-11 DIAGNOSIS — G8921 Chronic pain due to trauma: Secondary | ICD-10-CM | POA: Diagnosis not present

## 2022-01-17 NOTE — Progress Notes (Unsigned)
Cardiology Office Note:    Date:  01/18/2022   ID:  Sarah Chapman, DOB 07-22-1960, MRN [id number]  PCP:  Claretta Fraise, MD  Cardiologist:  Donato Heinz, MD  Electrophysiologist:  None   Referring MD: Claretta Fraise, MD   Chief Complaint  Patient presents with   Shortness of Breath   History of Present Illness:    Sarah Chapman is a 61 y.o. female with a hx of hypertension, COPD, hyperlipidemia, OSA who presents for follow-up.  She was referred by Dr. Livia Snellen for evaluation of dyspnea, initially seen on 09/24/2019.  States that she feels short of breath with minimal exertion.  Reports that walking up stairs or even across the room causes her symptoms.  Also reports that she has been having chest pain daily.  Describes as left-sided chest tightness.  Can last for hours.  Can occur at rest.  She has not been exercising.  Reports has gained 10 pounds in the last year.  She quit smoking 3 years ago, smoked 1 pack/day x 22 years.  No history of heart disease in her immediate family.  Coronary CTA on 10/23/2019 showed calcium score 411 (98th percentile), nonobstructive CAD with mixed plaque in proximal LAD causing 50 to 69% stenosis (CT FFR 0.84), calcified plaque in proximal circumflex causing 0 to 24% stenosis, mid next plaque in the mid circumflex causing 25 to 49% stenosis (CT FFR 0.82), mixed plaque in proximal and mid RCA causing 25 to 49% stenosis).  Since last clinic visit, she reports that she has been doing okay.  States that she feels tired all the time.  Does have some intermittent chest pain but cannot describe details.  Does report she is short of breath with walking around her house.  Has been having lightheadedness, denies any syncope.  Denies any lower extremity edema or palpitations.   Past Medical History:  Diagnosis Date   Allergic rhinitis    Back pain    Chronic headache    COPD (chronic obstructive pulmonary disease) (HCC)    Emphysema of lung (HCC)    Hyperlipidemia     Hypertension    Neck pain    Sleep apnea    Vaginal Pap smear, abnormal     Past Surgical History:  Procedure Laterality Date   CESAREAN SECTION     COLONOSCOPY N/A 01/07/2015   internal hemorrhoids, diverticulosis in sigmoid colon, exam otherwise normal, no biopsies.   GYNECOLOGIC CRYOSURGERY     NECK SURGERY     TONSILLECTOMY      Current Medications: Current Meds  Medication Sig   albuterol (VENTOLIN HFA) 108 (90 Base) MCG/ACT inhaler INHALE 2 PUFFS EVERY 6 HOURS AS NEEDED FOR WHEEZING OR SHORTNESS OF BREATH   ALPRAZolam (XANAX) 0.5 MG tablet Take 0.25-0.5 mg by mouth daily as needed.   Armodafinil 250 MG tablet Take 250 mg by mouth daily.   aspirin 81 MG EC tablet Take 1 tablet by mouth daily.   atorvastatin (LIPITOR) 80 MG tablet Take 1 tablet (80 mg total) by mouth daily.   BOTOX 200 units SOLR Every 3 months   buPROPion (WELLBUTRIN XL) 150 MG 24 hr tablet Take 450 mg by mouth daily.   dexlansoprazole (DEXILANT) 60 MG capsule Take 1 capsule (60 mg total) by mouth daily.   ezetimibe (ZETIA) 10 MG tablet TAKE 1 TABLET DAILY   HYDROcodone-acetaminophen (NORCO) 10-325 MG tablet Take 1 tablet by mouth 4 (four) times daily as needed.   Loratadine 10 MG CAPS  1 Once A Day Oral   Nebulizers (HOMENEB WITH SIDESTREAM) MISC See admin instructions.   nortriptyline (PAMELOR) 50 MG capsule Take 50 mg by mouth at bedtime.   olmesartan (BENICAR) 40 MG tablet Take 1 tablet by mouth daily.   pyridostigmine (MESTINON) 60 MG tablet Take 60 mg by mouth 2 (two) times daily.   tiZANidine (ZANAFLEX) 4 MG tablet Take by mouth.   Ubrogepant (UBRELVY PO) Take by mouth.     Allergies:   Clindamycin/lincomycin and Penicillins   Social History   Socioeconomic History   Marital status: Divorced    Spouse name: Not on file   Number of children: Not on file   Years of education: Not on file   Highest education level: Not on file  Occupational History   Not on file  Tobacco Use   Smoking  status: Former    Packs/day: 1.00    Years: 33.00    Total pack years: 33.00    Types: Cigarettes    Quit date: 07/25/2016    Years since quitting: 5.4   Smokeless tobacco: Never  Vaping Use   Vaping Use: Never used  Substance and Sexual Activity   Alcohol use: No   Drug use: No   Sexual activity: Not Currently    Birth control/protection: Post-menopausal  Other Topics Concern   Not on file  Social History Narrative   Not on file   Social Determinants of Health   Financial Resource Strain: Low Risk  (09/05/2021)   Overall Financial Resource Strain (CARDIA)    Difficulty of Paying Living Expenses: Not hard at all  Food Insecurity: No Food Insecurity (09/05/2021)   Hunger Vital Sign    Worried About Running Out of Food in the Last Year: Never true    Ran Out of Food in the Last Year: Never true  Transportation Needs: Unknown (09/05/2021)   PRAPARE - Hydrologist (Medical): No    Lack of Transportation (Non-Medical): Not on file  Physical Activity: Insufficiently Active (09/05/2021)   Exercise Vital Sign    Days of Exercise per Week: 3 days    Minutes of Exercise per Session: 30 min  Stress: No Stress Concern Present (09/05/2021)   [place]    Feeling of Stress : Not at all  Social Connections: Moderately Integrated (09/05/2021)   Social Connection and Isolation Panel [NHANES]    Frequency of Communication with Friends and Family: More than three times a week    Frequency of Social Gatherings with Friends and Family: More than three times a week    Attends Religious Services: 1 to 4 times per year    Active Member of [employer] or Organizations: Yes    Attends Music therapist: More than 4 times per year    Marital Status: Divorced     Family History: The patient's family history includes Bone cancer in her mother; Cancer in her maternal grandmother; Heart attack in her  brother; Mental illness in her son. There is no history of Colon cancer, Colon polyps, or Breast cancer.  ROS:   Please see the history of present illness.     All other systems reviewed and are negative.  EKGs/Labs/Other Studies Reviewed:    The following studies were reviewed today:   EKG:  EKG is ordered today.  The ekg ordered demonstrates normal sinus rhythm, rate 75, no ST/T abnormalities  Recent Labs: 02/07/2021: ALT 26; BUN 17;  Creatinine, Ser 1.23; Hemoglobin 13.0; Platelets 429; Potassium 4.7; Sodium 136 03/11/2021: TSH 1.390  Recent Lipid Panel    Component Value Date/Time   CHOL 219 (H) 01/17/2021 0835   TRIG 105 01/17/2021 0835   HDL 102 01/17/2021 0835   CHOLHDL 2.1 01/17/2021 0835   LDLCALC 99 01/17/2021 0835    Physical Exam:    VS:  BP 128/80   Pulse 86   Ht '5\' 5"'$  (1.651 m)   Wt 170 lb 12.8 oz (77.5 kg)   SpO2 97%   BMI 28.42 kg/m     Wt Readings from Last 3 Encounters:  01/18/22 170 lb 12.8 oz (77.5 kg)  12/29/21 170 lb 12.8 oz (77.5 kg)  09/28/21 170 lb (77.1 kg)     GEN:  in no acute distress HEENT: Normal NECK: No JVD CARDIAC: RRR, no murmurs, rubs, gallops RESPIRATORY:  Clear to auscultation without rales, wheezing or rhonchi  ABDOMEN: Soft, non-tender, non-distended MUSCULOSKELETAL:  No edema; No deformity  SKIN: Warm and dry NEUROLOGIC:  Alert and oriented x 3 PSYCHIATRIC:  Normal affect   ASSESSMENT:    1. Coronary artery disease involving native coronary artery of native heart without angina pectoris   2. Shortness of breath   3. Hyperlipidemia, unspecified hyperlipidemia type   4. Bilateral carotid artery stenosis     PLAN:     CAD: Presented with atypical chest pain.  Coronary CTA on 10/23/2019 showed calcium score 411 (98th percentile), nonobstructive CAD with mixed plaque in proximal LAD causing 50 to 69% stenosis (CT FFR 0.84), calcified plaque in proximal circumflex causing 0 to 24% stenosis, mid next plaque in the mid  circumflex causing 25 to 49% stenosis (CT FFR 0.82), mixed plaque in proximal and mid RCA causing 25 to 49% stenosis). -Continue aspirin 81 mg -Continue atorvastatin 80 mg daily -She is reporting dyspnea on exertion that could represent anginal equivalent.  Recommend exercise Myoview for further evaluation.  Hyperlipidemia: On atorvastatin 80 mg daily and Zetia 10 mg daily.  Check lipid panel  Hypertension: On olmesartan 40 mg daily.  Appears controlled  Carotid stenosis: Carotid duplex in 01/2019 showed right ICA less than 60% stenosis, left ICA 60 to 79%.  Asymptomatic.  Was seeing vascular surgery at [facility].  Repeat duplex 02/12/2020 shows 60 to 79% stenosis in bilateral ICA.  Duplex 04/01/2021 showed stable 60 to 79% stenosis in bilateral ICA.  Continue aspirin, statin.  Leg pain: Normal ABIs 03/31/2020  OSA: on CPAP, reports compliance.  RTC in 6 months  Shared Decision Making/Informed Consent The risks [chest pain, shortness of breath, cardiac arrhythmias, dizziness, blood pressure fluctuations, myocardial infarction, stroke/transient ischemic attack, nausea, vomiting, allergic reaction, radiation exposure, metallic taste sensation and life-threatening complications (estimated to be 1 in 10,000)], benefits (risk stratification, diagnosing coronary artery disease, treatment guidance) and alternatives of a nuclear stress test were discussed in detail with Sarah Chapman and she agrees to proceed.    Medication Adjustments/Labs and Tests Ordered: Current medicines are reviewed at length with the patient today.  Concerns regarding medicines are outlined above.  Orders Placed This Encounter  Procedures   Lipid panel   MYOCARDIAL PERFUSION IMAGING   No orders of the defined types were placed in this encounter.   Patient Instructions  Medication Instructions:  Your physician recommends that you continue on your current medications as directed. Please refer to the Current Medication list given  to you today.  *If you need a refill on your cardiac medications before your next  appointment, please call your pharmacy*   Lab Work: Please return for FASTING labs (Lipid)  Our in office lab hours are Monday-Friday 8:00-4:00, closed for lunch 12:45-1:45 pm.  No appointment needed.  [facility] locations:   [place] ([place]) - [address] [place] [address] - [address] [place] - [address] Oncologist)  Testing/Procedures: Your physician has requested that you have an exercise stress myoview. For further information please visit [email or web address]. Please follow instruction sheet, as given.  Follow-Up: At [facility], you and your health needs are our priority.  As part of our continuing mission to provide you with exceptional heart care, we have created designated Provider Care Teams.  These Care Teams include your primary Cardiologist (physician) and Advanced Practice Providers (APPs -  Physician Assistants and Nurse Practitioners) who all work together to provide you with the care you need, when you need it.  We recommend signing up for the patient portal called "MyChart".  Sign up information is provided on this After Visit Summary.  MyChart is used to connect with patients for Virtual Visits (Telemedicine).  Patients are able to view lab/test results, encounter notes, upcoming appointments, etc.  Non-urgent messages can be sent to your provider as well.   To learn more about what you can do with MyChart, go to [email or web address].    Your next appointment:   6 month(s)  The format for your next appointment:   In Person  Provider:   Donato Heinz, MD  {           Signed, Donato Heinz, MD  01/18/2022 5:45 PM    [place]

## 2022-01-18 ENCOUNTER — Ambulatory Visit: Payer: Medicare HMO | Admitting: Cardiology

## 2022-01-18 ENCOUNTER — Encounter: Payer: Self-pay | Admitting: Cardiology

## 2022-01-18 VITALS — BP 128/80 | HR 86 | Ht 65.0 in | Wt 170.8 lb

## 2022-01-18 DIAGNOSIS — I2510 Atherosclerotic heart disease of native coronary artery without angina pectoris: Secondary | ICD-10-CM

## 2022-01-18 DIAGNOSIS — I6523 Occlusion and stenosis of bilateral carotid arteries: Secondary | ICD-10-CM | POA: Diagnosis not present

## 2022-01-18 DIAGNOSIS — R0602 Shortness of breath: Secondary | ICD-10-CM

## 2022-01-18 DIAGNOSIS — E785 Hyperlipidemia, unspecified: Secondary | ICD-10-CM | POA: Diagnosis not present

## 2022-01-18 NOTE — Patient Instructions (Signed)
Medication Instructions:  Your physician recommends that you continue on your current medications as directed. Please refer to the Current Medication list given to you today.  *If you need a refill on your cardiac medications before your next appointment, please call your pharmacy*   Lab Work: Please return for FASTING labs (Lipid)  Our in office lab hours are Monday-Friday 8:00-4:00, closed for lunch 12:45-1:45 pm.  No appointment needed.  [facility] locations:   [place] ([place]) - [address] [place] [address] - [address] [place] - [address] Oncologist)  Testing/Procedures: Your physician has requested that you have an exercise stress myoview. For further information please visit [email or web address]. Please follow instruction sheet, as given.  Follow-Up: At [facility], you and your health needs are our priority.  As part of our continuing mission to provide you with exceptional heart care, we have created designated Provider Care Teams.  These Care Teams include your primary Cardiologist (physician) and Advanced Practice Providers (APPs -  Physician Assistants and Nurse Practitioners) who all work together to provide you with the care you need, when you need it.  We recommend signing up for the patient portal called "MyChart".  Sign up information is provided on this After Visit Summary.  MyChart is used to connect with patients for Virtual Visits (Telemedicine).  Patients are able to view lab/test results, encounter notes, upcoming appointments, etc.  Non-urgent messages can be sent to your provider as well.   To learn more about what you can do with MyChart, go to  [email or web address].    Your next appointment:   6 month(s)  The format for your next appointment:   In Person  Provider:   Donato Heinz, MD {

## 2022-01-23 ENCOUNTER — Telehealth (HOSPITAL_COMMUNITY): Payer: Self-pay | Admitting: *Deleted

## 2022-01-23 NOTE — Telephone Encounter (Signed)
Left message on voicemail per DPR in reference to upcoming appointment scheduled on  01/25/22 with detailed instructions given per Myocardial Perfusion Study Information Sheet for the test. LM to arrive 15 minutes early, and that it is imperative to arrive on time for appointment to keep from having the test rescheduled. If you need to cancel or reschedule your appointment, please call the office within 24 hours of your appointment. Failure to do so may result in a cancellation of your appointment, and a $50 no show fee. Phone number given for call back for any questions. Kirstie Peri

## 2022-01-24 ENCOUNTER — Other Ambulatory Visit: Payer: Medicare HMO

## 2022-01-24 DIAGNOSIS — E785 Hyperlipidemia, unspecified: Secondary | ICD-10-CM | POA: Diagnosis not present

## 2022-01-24 DIAGNOSIS — I2510 Atherosclerotic heart disease of native coronary artery without angina pectoris: Secondary | ICD-10-CM | POA: Diagnosis not present

## 2022-01-25 ENCOUNTER — Ambulatory Visit (HOSPITAL_COMMUNITY): Payer: Medicare HMO | Attending: Cardiology

## 2022-01-25 DIAGNOSIS — R0602 Shortness of breath: Secondary | ICD-10-CM | POA: Insufficient documentation

## 2022-01-25 LAB — MYOCARDIAL PERFUSION IMAGING
Estimated workload: 5.4
Exercise duration (min): 3 min
LV dias vol: 88 mL (ref 46–106)
LV sys vol: 53 mL
MPHR: 98 {beats}/min
Nuc Stress EF: 40 %
Peak HR: 98 {beats}/min
Percent HR: 62 %
RPE: 19
Rest HR: 85 {beats}/min
Rest Nuclear Isotope Dose: 10.3 mCi
SDS: 1
SRS: 0
SSS: 1
ST Depression (mm): 0 mm
Stress Nuclear Isotope Dose: 30.7 mCi
TID: 1.01

## 2022-01-25 LAB — LIPID PANEL
Chol/HDL Ratio: 2.2 ratio (ref 0.0–4.4)
Cholesterol, Total: 192 mg/dL (ref 100–199)
HDL: 86 mg/dL (ref >39)
LDL Chol Calc (NIH): 79 mg/dL (ref 0–99)
Triglycerides: 163 mg/dL — ABNORMAL HIGH (ref 0–149)
VLDL Cholesterol Cal: 27 mg/dL (ref 5–40)

## 2022-01-25 MED ORDER — TECHNETIUM TC 99M TETROFOSMIN IV KIT
10.3000 | PACK | Freq: Once | INTRAVENOUS | Status: AC | PRN
  Administered 2022-01-25: 10.3 via INTRAVENOUS

## 2022-01-25 MED ORDER — REGADENOSON 0.4 MG/5ML IV SOLN
0.4000 mg | Freq: Once | INTRAVENOUS | Status: AC
  Administered 2022-01-25: 0.4 mg via INTRAVENOUS

## 2022-01-25 MED ORDER — TECHNETIUM TC 99M TETROFOSMIN IV KIT
30.7000 | PACK | Freq: Once | INTRAVENOUS | Status: AC | PRN
  Administered 2022-01-25: 30.7 via INTRAVENOUS

## 2022-01-31 ENCOUNTER — Other Ambulatory Visit: Payer: Self-pay | Admitting: *Deleted

## 2022-01-31 DIAGNOSIS — E785 Hyperlipidemia, unspecified: Secondary | ICD-10-CM

## 2022-01-31 DIAGNOSIS — R0602 Shortness of breath: Secondary | ICD-10-CM

## 2022-02-02 DIAGNOSIS — Z79891 Long term (current) use of opiate analgesic: Secondary | ICD-10-CM | POA: Diagnosis not present

## 2022-02-02 DIAGNOSIS — M5136 Other intervertebral disc degeneration, lumbar region: Secondary | ICD-10-CM | POA: Diagnosis not present

## 2022-02-02 DIAGNOSIS — M5134 Other intervertebral disc degeneration, thoracic region: Secondary | ICD-10-CM | POA: Diagnosis not present

## 2022-02-02 DIAGNOSIS — Z79899 Other long term (current) drug therapy: Secondary | ICD-10-CM | POA: Diagnosis not present

## 2022-02-02 DIAGNOSIS — M797 Fibromyalgia: Secondary | ICD-10-CM | POA: Diagnosis not present

## 2022-02-02 DIAGNOSIS — M25569 Pain in unspecified knee: Secondary | ICD-10-CM | POA: Diagnosis not present

## 2022-02-02 DIAGNOSIS — M961 Postlaminectomy syndrome, not elsewhere classified: Secondary | ICD-10-CM | POA: Diagnosis not present

## 2022-02-02 DIAGNOSIS — M546 Pain in thoracic spine: Secondary | ICD-10-CM | POA: Diagnosis not present

## 2022-02-02 DIAGNOSIS — M47812 Spondylosis without myelopathy or radiculopathy, cervical region: Secondary | ICD-10-CM | POA: Diagnosis not present

## 2022-02-02 DIAGNOSIS — M47814 Spondylosis without myelopathy or radiculopathy, thoracic region: Secondary | ICD-10-CM | POA: Diagnosis not present

## 2022-02-02 DIAGNOSIS — G894 Chronic pain syndrome: Secondary | ICD-10-CM | POA: Diagnosis not present

## 2022-02-02 DIAGNOSIS — M25529 Pain in unspecified elbow: Secondary | ICD-10-CM | POA: Diagnosis not present

## 2022-02-07 ENCOUNTER — Encounter: Payer: Self-pay | Admitting: Gastroenterology

## 2022-02-07 ENCOUNTER — Ambulatory Visit: Payer: Medicare HMO | Admitting: Gastroenterology

## 2022-02-07 VITALS — BP 133/78 | HR 91 | Temp 97.7°F | Ht 65.0 in | Wt 173.2 lb

## 2022-02-07 DIAGNOSIS — K5903 Drug induced constipation: Secondary | ICD-10-CM | POA: Diagnosis not present

## 2022-02-07 DIAGNOSIS — K219 Gastro-esophageal reflux disease without esophagitis: Secondary | ICD-10-CM

## 2022-02-07 MED ORDER — DEXLANSOPRAZOLE 60 MG PO CPDR: 60.0000 mg | DELAYED_RELEASE_CAPSULE | Freq: Every day | ORAL | 3 refills | Status: DC

## 2022-02-07 NOTE — Progress Notes (Signed)
Gastroenterology Office Note     Primary Care Physician:  Claretta Fraise, MD  Primary Gastroenterologist: Dr. Abbey Chatters   Chief Complaint   Chief Complaint  Patient presents with   Follow-up    Still has issues with constipation but states that exlax helps.      History of Present Illness   Sarah Chapman is a 61 y.o. female presenting today in follow-up with a history of constipation and GERD.   Constipation: Linzess 290 mcg without improvement. Amitza sent to pharmacy and no improvement with this. She does not believe she has tried Best boy. 2 exlax daily. Will have a BM daily with this. If stays vigilant on high fiber, water, and ex lax does well. As of last visit, she wanted to continue on this regimen. She will take 3 now instead of 2 at times. She does not want to try Movantik at this point while things are going well.   GERD: Dexilant daily. This controls symptoms. She can tell if she does not take this.        Past Medical History:  Diagnosis Date   Allergic rhinitis    Back pain    Chronic headache    COPD (chronic obstructive pulmonary disease) (HCC)    Emphysema of lung (HCC)    Hyperlipidemia    Hypertension    Neck pain    Sleep apnea    Vaginal Pap smear, abnormal     Past Surgical History:  Procedure Laterality Date   CESAREAN SECTION     COLONOSCOPY N/A 01/07/2015   internal hemorrhoids, diverticulosis in sigmoid colon, exam otherwise normal, no biopsies.   GYNECOLOGIC CRYOSURGERY     NECK SURGERY     TONSILLECTOMY      Current Outpatient Medications  Medication Sig Dispense Refill   albuterol (VENTOLIN HFA) 108 (90 Base) MCG/ACT inhaler INHALE 2 PUFFS EVERY 6 HOURS AS NEEDED FOR WHEEZING OR SHORTNESS OF BREATH 18 g 5   ALPRAZolam (XANAX) 0.5 MG tablet Take 0.25-0.5 mg by mouth daily as needed.     Armodafinil 250 MG tablet Take 250 mg by mouth daily.     aspirin 81 MG EC tablet Take 1 tablet by mouth daily.     atorvastatin (LIPITOR) 80 MG  tablet Take 1 tablet (80 mg total) by mouth daily. 90 tablet 3   BOTOX 200 units SOLR Every 3 months     buPROPion HCl ER, XL, 450 MG TB24 Take 450 mg by mouth daily.     dexlansoprazole (DEXILANT) 60 MG capsule Take 1 capsule (60 mg total) by mouth daily. 90 capsule 3   ezetimibe (ZETIA) 10 MG tablet TAKE 1 TABLET DAILY 30 tablet 3   Fluticasone-Umeclidin-Vilant (TRELEGY ELLIPTA) 200-62.5-25 MCG/ACT AEPB INHALE 1 PUFF ONCE A DAY 60 each 2   HYDROcodone-acetaminophen (NORCO) 10-325 MG tablet Take 1 tablet by mouth 4 (four) times daily as needed.     ipratropium-albuterol (DUONEB) 0.5-2.5 (3) MG/3ML SOLN Take 3 mLs by nebulization every 6 (six) hours as needed. 360 mL 3   Loratadine 10 MG CAPS 1 Once A Day Oral     naloxone (NARCAN) nasal spray 4 mg/0.1 mL      Nebulizers (HOMENEB WITH SIDESTREAM) MISC See admin instructions.     nortriptyline (PAMELOR) 75 MG capsule Take 75 mg by mouth at bedtime.     olmesartan (BENICAR) 40 MG tablet Take 1 tablet by mouth daily.     tiZANidine (ZANAFLEX) 4 MG tablet Take by  mouth.     Ubrogepant (UBRELVY PO) Take by mouth.     No current facility-administered medications for this visit.    Allergies as of 02/07/2022 - Review Complete 02/07/2022  Allergen Reaction Noted   Clindamycin/lincomycin Rash 11/27/2012   Penicillins  04/03/2014    Family History  Problem Relation Age of Onset   Bone cancer Mother    Cancer Maternal Grandmother    Heart attack Brother    Mental illness Son    Colon cancer Neg Hx    Colon polyps Neg Hx    Breast cancer Neg Hx     Social History   Socioeconomic History   Marital status: Divorced    Spouse name: Not on file   Number of children: Not on file   Years of education: Not on file   Highest education level: Not on file  Occupational History   Not on file  Tobacco Use   Smoking status: Former    Packs/day: 1.00    Years: 33.00    Total pack years: 33.00    Types: Cigarettes    Quit date: 07/25/2016     Years since quitting: 5.5   Smokeless tobacco: Never  Vaping Use   Vaping Use: Never used  Substance and Sexual Activity   Alcohol use: No   Drug use: No   Sexual activity: Not Currently    Birth control/protection: Post-menopausal  Other Topics Concern   Not on file  Social History Narrative   Not on file   Social Determinants of Health   Financial Resource Strain: Low Risk  (09/05/2021)   Overall Financial Resource Strain (CARDIA)    Difficulty of Paying Living Expenses: Not hard at all  Food Insecurity: No Food Insecurity (09/05/2021)   Hunger Vital Sign    Worried About Running Out of Food in the Last Year: Never true    Ran Out of Food in the Last Year: Never true  Transportation Needs: Unknown (09/05/2021)   PRAPARE - Hydrologist (Medical): No    Lack of Transportation (Non-Medical): Not on file  Physical Activity: Insufficiently Active (09/05/2021)   Exercise Vital Sign    Days of Exercise per Week: 3 days    Minutes of Exercise per Session: 30 min  Stress: No Stress Concern Present (09/05/2021)   [place]    Feeling of Stress : Not at all  Social Connections: Moderately Integrated (09/05/2021)   Social Connection and Isolation Panel [NHANES]    Frequency of Communication with Friends and Family: More than three times a week    Frequency of Social Gatherings with Friends and Family: More than three times a week    Attends Religious Services: 1 to 4 times per year    Active Member of [employer] or Organizations: Yes    Attends Music therapist: More than 4 times per year    Marital Status: Divorced  Intimate Partner Violence: Not At Risk (09/05/2021)   Humiliation, Afraid, Rape, and Kick questionnaire    Fear of Current or Ex-Partner: No    Emotionally Abused: No    Physically Abused: No    Sexually Abused: No     Review of Systems   Gen: Denies any fever,  chills, fatigue, weight loss, lack of appetite.  CV: Denies chest pain, heart palpitations, peripheral edema, syncope.  Resp: Denies shortness of breath at rest or with exertion. Denies wheezing or cough.  GI: see HPI GU : Denies urinary burning, urinary frequency, urinary hesitancy MS: Denies joint pain, muscle weakness, cramps, or limitation of movement.  Derm: Denies rash, itching, dry skin Psych: Denies depression, anxiety, memory loss, and confusion Heme: Denies bruising, bleeding, and enlarged lymph nodes.   Physical Exam   BP 133/78 (BP Location: Left Arm, Patient Position: Sitting, Cuff Size: Normal)   Pulse 91   Temp 97.7 F (36.5 C) (Temporal)   Ht '5\' 5"'$  (1.651 m)   Wt 173 lb 3.2 oz (78.6 kg)   SpO2 98%   BMI 28.82 kg/m  General:   Alert and oriented. Pleasant and cooperative. Well-nourished and well-developed.  Head:  Normocephalic and atraumatic. Eyes:  Without icterus Abdomen:  +BS, soft, non-tender and non-distended. No HSM noted. No guarding or rebound. No masses appreciated.  Rectal:  Deferred  Msk:  Symmetrical without gross deformities. Normal posture. Extremities:  Without edema. Neurologic:  Alert and  oriented x4;  grossly normal neurologically. Skin:  Intact without significant lesions or rashes. Psych:  Alert and cooperative. Normal mood and affect.   Assessment   Sarah Chapman is a 61 y.o. female presenting today in follow-up with a history of constipation and GERD.   Constipation: tried and failed Linzess and Amitiza. She would benefit from trial of Movantik but would rather stay with OTC laxatives, which is helping her. Continue with increased water, fiber, and laxatives for now.  GERD well managed on Dexilant daily. Refills provided.   PLAN   Continue Dexilant once daily Continue current bowel regimen Return in 1 year or sooner if needed   Annitta Needs, PhD, ANP-BC [facility] Gastroenterology

## 2022-02-07 NOTE — Patient Instructions (Signed)
I have sent in [place] for you.  Continue your current regimen for constipation. Let me know if you want to try a different prescriptive agent!  We will see you in 1 year or sooner if needed!  I enjoyed seeing you again today! As you know, I value our relationship and want to provide genuine, compassionate, and quality care. I welcome your feedback. If you receive a survey regarding your visit,  I greatly appreciate you taking time to fill this out. See you next time!  Annitta Needs, PhD, ANP-BC [facility] Gastroenterology

## 2022-02-08 ENCOUNTER — Ambulatory Visit (HOSPITAL_COMMUNITY): Payer: Medicare HMO | Attending: Cardiology

## 2022-02-09 ENCOUNTER — Other Ambulatory Visit: Payer: Self-pay | Admitting: Cardiology

## 2022-02-09 DIAGNOSIS — R0602 Shortness of breath: Secondary | ICD-10-CM

## 2022-02-09 DIAGNOSIS — R9430 Abnormal result of cardiovascular function study, unspecified: Secondary | ICD-10-CM

## 2022-02-14 ENCOUNTER — Ambulatory Visit (HOSPITAL_COMMUNITY): Payer: Medicare HMO

## 2022-02-15 ENCOUNTER — Other Ambulatory Visit: Payer: Self-pay | Admitting: Cardiology

## 2022-02-15 ENCOUNTER — Ambulatory Visit: Payer: Medicare HMO | Attending: Cardiology

## 2022-02-15 DIAGNOSIS — R0602 Shortness of breath: Secondary | ICD-10-CM

## 2022-02-15 DIAGNOSIS — R9439 Abnormal result of other cardiovascular function study: Secondary | ICD-10-CM

## 2022-02-15 DIAGNOSIS — R9430 Abnormal result of cardiovascular function study, unspecified: Secondary | ICD-10-CM

## 2022-02-15 LAB — ECHOCARDIOGRAM COMPLETE
AR max vel: 1.84 cm2
AV Peak grad: 8.2 mmHg
Ao pk vel: 1.44 m/s
Area-P 1/2: 2.55 cm2
Calc EF: 68 %
S' Lateral: 2.39 cm
Single Plane A2C EF: 74.4 %
Single Plane A4C EF: 61.8 %

## 2022-03-13 DIAGNOSIS — F411 Generalized anxiety disorder: Secondary | ICD-10-CM | POA: Diagnosis not present

## 2022-03-13 DIAGNOSIS — G8921 Chronic pain due to trauma: Secondary | ICD-10-CM | POA: Diagnosis not present

## 2022-03-13 DIAGNOSIS — F339 Major depressive disorder, recurrent, unspecified: Secondary | ICD-10-CM | POA: Diagnosis not present

## 2022-03-13 DIAGNOSIS — R69 Illness, unspecified: Secondary | ICD-10-CM | POA: Diagnosis not present

## 2022-03-13 DIAGNOSIS — F900 Attention-deficit hyperactivity disorder, predominantly inattentive type: Secondary | ICD-10-CM | POA: Diagnosis not present

## 2022-03-14 DIAGNOSIS — M546 Pain in thoracic spine: Secondary | ICD-10-CM | POA: Diagnosis not present

## 2022-03-14 DIAGNOSIS — M5134 Other intervertebral disc degeneration, thoracic region: Secondary | ICD-10-CM | POA: Diagnosis not present

## 2022-03-14 DIAGNOSIS — M961 Postlaminectomy syndrome, not elsewhere classified: Secondary | ICD-10-CM | POA: Diagnosis not present

## 2022-03-14 DIAGNOSIS — M5136 Other intervertebral disc degeneration, lumbar region: Secondary | ICD-10-CM | POA: Diagnosis not present

## 2022-03-14 DIAGNOSIS — M25569 Pain in unspecified knee: Secondary | ICD-10-CM | POA: Diagnosis not present

## 2022-03-14 DIAGNOSIS — M47812 Spondylosis without myelopathy or radiculopathy, cervical region: Secondary | ICD-10-CM | POA: Diagnosis not present

## 2022-03-14 DIAGNOSIS — M47814 Spondylosis without myelopathy or radiculopathy, thoracic region: Secondary | ICD-10-CM | POA: Diagnosis not present

## 2022-03-14 DIAGNOSIS — M797 Fibromyalgia: Secondary | ICD-10-CM | POA: Diagnosis not present

## 2022-03-14 DIAGNOSIS — G894 Chronic pain syndrome: Secondary | ICD-10-CM | POA: Diagnosis not present

## 2022-03-14 DIAGNOSIS — Z79899 Other long term (current) drug therapy: Secondary | ICD-10-CM | POA: Diagnosis not present

## 2022-03-14 DIAGNOSIS — M25529 Pain in unspecified elbow: Secondary | ICD-10-CM | POA: Diagnosis not present

## 2022-03-14 DIAGNOSIS — Z79891 Long term (current) use of opiate analgesic: Secondary | ICD-10-CM | POA: Diagnosis not present

## 2022-03-15 ENCOUNTER — Encounter: Payer: Self-pay | Admitting: Pharmacist Clinician (PhC)/ Clinical Pharmacy Specialist

## 2022-03-15 ENCOUNTER — Ambulatory Visit: Payer: Medicare HMO | Attending: Internal Medicine | Admitting: Pharmacist Clinician (PhC)/ Clinical Pharmacy Specialist

## 2022-03-15 DIAGNOSIS — E785 Hyperlipidemia, unspecified: Secondary | ICD-10-CM | POA: Diagnosis not present

## 2022-03-15 NOTE — Progress Notes (Unsigned)
03/15/2022 Sarah Chapman [birth year] [id number]   HPI:  Sarah Chapman is a 61 y.o. female patient of Dr Gardiner Rhyme, who presents today for a lipid clinic evaluation.  See pertinent past medical history below.  She is in the office today to discuss options for further LDL lowering.   Past Medical History: HTN Controlled on olmesartan 40  CAD 50-69% stenosis of LAD, calcium score 411 (98th percentile)  fibromyalgia     Insurance Carrier: [employer] ; $95 deductible (divided into first 2 fills $94/ea, then $47/mo, $398/mo in coverage gap  Current Medications:    atorvastatin 80 mg qd, ezetimibe 10 mg qd    Cholesterol Goals: LDL < 70   Intolerant/previously tried: simvastatin      Family history: mother had MI, died from bone cancer; father not known, 1/2 brothers healthy; 2 children healthy  Diet: mix of eating out and home cooked, no fast food; mix of proteins at home; plenty of vegetables, usually frozen or canned; does snack, admits to sweet tooth; knows she needs to lose some weight  Exercise:  none      Lipid Panel     Component Value Date/Time   CHOL 192 01/24/2022 0837   TRIG 163 (H) 01/24/2022 0837   HDL 86 01/24/2022 0837   CHOLHDL 2.2 01/24/2022 0837   LDLCALC 79 01/24/2022 0837   LABVLDL 27 01/24/2022 0837     Current Outpatient Medications  Medication Sig Dispense Refill   albuterol (VENTOLIN HFA) 108 (90 Base) MCG/ACT inhaler INHALE 2 PUFFS EVERY 6 HOURS AS NEEDED FOR WHEEZING OR SHORTNESS OF BREATH 18 g 5   ALPRAZolam (XANAX) 0.5 MG tablet Take 0.25-0.5 mg by mouth daily as needed.     Armodafinil 250 MG tablet Take 250 mg by mouth daily.     aspirin 81 MG EC tablet Take 1 tablet by mouth daily.     atorvastatin (LIPITOR) 80 MG tablet Take 1 tablet (80 mg total) by mouth daily. 90 tablet 3   BOTOX 200 units SOLR Every 3 months     buPROPion HCl ER, XL, 450 MG TB24 Take 450 mg by mouth daily.     dexlansoprazole (DEXILANT) 60 MG capsule  Take 1 capsule (60 mg total) by mouth daily. 90 capsule 3   ezetimibe (ZETIA) 10 MG tablet TAKE 1 TABLET DAILY 30 tablet 3   HYDROcodone-acetaminophen (NORCO) 10-325 MG tablet Take 1 tablet by mouth 4 (four) times daily as needed.     ipratropium-albuterol (DUONEB) 0.5-2.5 (3) MG/3ML SOLN Take 3 mLs by nebulization every 6 (six) hours as needed. 360 mL 3   Loratadine 10 MG CAPS 1 Once A Day Oral     naloxone (NARCAN) nasal spray 4 mg/0.1 mL      Nebulizers (HOMENEB WITH SIDESTREAM) MISC See admin instructions.     nortriptyline (PAMELOR) 75 MG capsule Take 75 mg by mouth at bedtime.     olmesartan (BENICAR) 40 MG tablet Take 1 tablet by mouth daily.     tiZANidine (ZANAFLEX) 4 MG tablet Take by mouth.     Ubrogepant (UBRELVY PO) Take by mouth.     Fluticasone-Umeclidin-Vilant (TRELEGY ELLIPTA) 200-62.5-25 MCG/ACT AEPB INHALE 1 PUFF ONCE A DAY (Patient not taking: Reported on 03/15/2022) 60 each 2   No current facility-administered medications for this visit.    Allergies  Allergen Reactions   Clindamycin/Lincomycin Rash   Penicillins     Unknown     Past Medical History:  Diagnosis Date   Allergic rhinitis    Back pain    Chronic headache    COPD (chronic obstructive pulmonary disease) (HCC)    Emphysema of lung ([place])    Hyperlipidemia    Hypertension    Neck pain    Sleep apnea    Vaginal Pap smear, abnormal     There were no vitals taken for this visit.   Hyperlipidemia Patient with elevated calcium score, at 98th percentile for matched controls.  Currently on atorvastatin 80 and ezetimibe, no issues with compliance.  Reviewed options for lowering LDL cholesterol, including PCSK-9 inhibitors, bempedoic acid and inclisiran.  Discussed mechanisms of action, dosing, side effects and potential decreases in LDL cholesterol.  Also reviewed cost information and potential options for patient assistance.  Answered all patient questions.  Based on this information, patient would  prefer to start PCSK9 inhibitor.  Praluent is the preferred medication for her plan.  Once prior authorization complete, she will do one injection every 14 days and repeat labs after 5-6 doses.     Tommy Medal PharmD CPP [place] [address] [place], [place] [address] [phone]

## 2022-03-15 NOTE — Patient Instructions (Addendum)
Your Results:             Your most recent labs Goal  Total Cholesterol 192 < 200  Triglycerides 163 < 150  HDL (happy/good cholesterol) 86 > 40  LDL (lousy/bad cholesterol 79 < 55   Medication changes:  We will start the process to get Praluent covered by your insurance.  Once the prior authorization is complete, [facility] Silos will call you to let you know and confirm pharmacy information.   You will take Praluent 150 mg every 14 days.    Lab orders:  We want to repeat labs after 2-3 months.  We will send you a lab order to remind you once we get closer to that time.    Any questions or concerns call Jaycen Vercher/Chris at [phone]  Thank you for choosing [facility]

## 2022-03-15 NOTE — Assessment & Plan Note (Signed)
Patient with elevated calcium score, at 98th percentile for matched controls.  Currently on atorvastatin 80 and ezetimibe, no issues with compliance.  Reviewed options for lowering LDL cholesterol, including PCSK-9 inhibitors, bempedoic acid and inclisiran.  Discussed mechanisms of action, dosing, side effects and potential decreases in LDL cholesterol.  Also reviewed cost information and potential options for patient assistance.  Answered all patient questions.  Based on this information, patient would prefer to start PCSK9 inhibitor.  Praluent is the preferred medication for her plan.  Once prior authorization complete, she will do one injection every 14 days and repeat labs after 5-6 doses.

## 2022-03-17 ENCOUNTER — Other Ambulatory Visit: Payer: Self-pay | Admitting: Pharmacist Clinician (PhC)/ Clinical Pharmacy Specialist

## 2022-03-17 MED ORDER — REPATHA SURECLICK 140 MG/ML ~~LOC~~ SOAJ: 140.0000 mg | SUBCUTANEOUS | 12 refills | Status: DC

## 2022-03-27 ENCOUNTER — Other Ambulatory Visit: Payer: Self-pay | Admitting: Cardiology

## 2022-03-27 ENCOUNTER — Other Ambulatory Visit: Payer: Self-pay | Admitting: Family Medicine

## 2022-03-27 DIAGNOSIS — I10 Essential (primary) hypertension: Secondary | ICD-10-CM

## 2022-04-05 ENCOUNTER — Other Ambulatory Visit: Payer: Self-pay | Admitting: Pharmacist Clinician (PhC)/ Clinical Pharmacy Specialist

## 2022-04-05 ENCOUNTER — Telehealth: Payer: Self-pay | Admitting: Pharmacist Clinician (PhC)/ Clinical Pharmacy Specialist

## 2022-04-05 MED ORDER — OLMESARTAN MEDOXOMIL 40 MG PO TABS: 40.0000 mg | ORAL_TABLET | Freq: Every day | ORAL | 0 refills | Status: DC

## 2022-04-05 NOTE — Telephone Encounter (Signed)
PA started for Praluent  Key:  [facility]

## 2022-04-06 NOTE — Telephone Encounter (Signed)
PA approved through 05/28/22.

## 2022-04-07 ENCOUNTER — Encounter: Payer: Self-pay | Admitting: *Deleted

## 2022-04-10 ENCOUNTER — Other Ambulatory Visit: Payer: Self-pay | Admitting: Pharmacist Clinician (PhC)/ Clinical Pharmacy Specialist

## 2022-04-10 MED ORDER — REPATHA SURECLICK 140 MG/ML ~~LOC~~ SOAJ: 140.0000 mg | SUBCUTANEOUS | 12 refills | Status: DC

## 2022-04-11 ENCOUNTER — Ambulatory Visit (HOSPITAL_COMMUNITY)
Admission: RE | Admit: 2022-04-11 | Discharge: 2022-04-11 | Disposition: A | Discharge status: Home / Self Care | Payer: Medicare HMO | Source: Ambulatory Visit | Attending: Physician Assistant | Admitting: Physician Assistant

## 2022-04-11 DIAGNOSIS — I6529 Occlusion and stenosis of unspecified carotid artery: Secondary | ICD-10-CM

## 2022-04-13 ENCOUNTER — Telehealth: Payer: Self-pay | Admitting: Cardiology

## 2022-04-13 ENCOUNTER — Other Ambulatory Visit: Payer: Self-pay

## 2022-04-13 DIAGNOSIS — M797 Fibromyalgia: Secondary | ICD-10-CM | POA: Diagnosis not present

## 2022-04-13 DIAGNOSIS — M25529 Pain in unspecified elbow: Secondary | ICD-10-CM | POA: Diagnosis not present

## 2022-04-13 DIAGNOSIS — M47814 Spondylosis without myelopathy or radiculopathy, thoracic region: Secondary | ICD-10-CM | POA: Diagnosis not present

## 2022-04-13 DIAGNOSIS — M5134 Other intervertebral disc degeneration, thoracic region: Secondary | ICD-10-CM | POA: Diagnosis not present

## 2022-04-13 DIAGNOSIS — M546 Pain in thoracic spine: Secondary | ICD-10-CM | POA: Diagnosis not present

## 2022-04-13 DIAGNOSIS — G894 Chronic pain syndrome: Secondary | ICD-10-CM | POA: Diagnosis not present

## 2022-04-13 DIAGNOSIS — Z79891 Long term (current) use of opiate analgesic: Secondary | ICD-10-CM | POA: Diagnosis not present

## 2022-04-13 DIAGNOSIS — Z79899 Other long term (current) drug therapy: Secondary | ICD-10-CM | POA: Diagnosis not present

## 2022-04-13 DIAGNOSIS — M25569 Pain in unspecified knee: Secondary | ICD-10-CM | POA: Diagnosis not present

## 2022-04-13 DIAGNOSIS — M961 Postlaminectomy syndrome, not elsewhere classified: Secondary | ICD-10-CM | POA: Diagnosis not present

## 2022-04-13 DIAGNOSIS — M47812 Spondylosis without myelopathy or radiculopathy, cervical region: Secondary | ICD-10-CM | POA: Diagnosis not present

## 2022-04-13 DIAGNOSIS — M5136 Other intervertebral disc degeneration, lumbar region: Secondary | ICD-10-CM | POA: Diagnosis not present

## 2022-04-13 DIAGNOSIS — I6529 Occlusion and stenosis of unspecified carotid artery: Secondary | ICD-10-CM

## 2022-04-13 MED ORDER — PRALUENT 150 MG/ML ~~LOC~~ SOAJ: 1.0000 mL | SUBCUTANEOUS | 11 refills | Status: DC

## 2022-04-13 NOTE — Telephone Encounter (Signed)
Called patient, gave results.  Carotid US reordered for 1 year.   Patient verbalized understanding to continue medications and repeat in 1 year.   Thanks!

## 2022-04-13 NOTE — Addendum Note (Signed)
Addended by: Marcelle Overlie D on: 04/13/2022 12:26 PM   Modules accepted: Orders

## 2022-04-13 NOTE — Telephone Encounter (Signed)
Pt returning call regarding test results. Please advise ?

## 2022-04-13 NOTE — Telephone Encounter (Signed)
PA was approved for Praluent. Looks like Repatha was sent to pharmacy? Received PA request for Repatha. Rx for Praluent sent.

## 2022-05-05 ENCOUNTER — Telehealth: Payer: Self-pay

## 2022-05-05 NOTE — Telephone Encounter (Addendum)
Called patient regarding results. Left message for patient . Letter mailed 12/23----- Message from Warren Lacy, PA-C sent at 04/12/2022  8:59 AM EST ----- Right Carotid: Velocities in the right ICA are consistent with a 40-59% stenosis. Left Carotid: Velocities in the left ICA are consistent with a 60-79% stenosis. No significant change from last year. Rec: Repeat carotid duplex in 1 year.  Continue aspirin 81, lipitor and zetia.   [facility] [place] -- please order carotid duplex in 1 year.

## 2022-05-11 DIAGNOSIS — M5134 Other intervertebral disc degeneration, thoracic region: Secondary | ICD-10-CM | POA: Diagnosis not present

## 2022-05-11 DIAGNOSIS — G894 Chronic pain syndrome: Secondary | ICD-10-CM | POA: Diagnosis not present

## 2022-05-11 DIAGNOSIS — M961 Postlaminectomy syndrome, not elsewhere classified: Secondary | ICD-10-CM | POA: Diagnosis not present

## 2022-05-11 DIAGNOSIS — Z79891 Long term (current) use of opiate analgesic: Secondary | ICD-10-CM | POA: Diagnosis not present

## 2022-05-11 DIAGNOSIS — Z79899 Other long term (current) drug therapy: Secondary | ICD-10-CM | POA: Diagnosis not present

## 2022-05-11 DIAGNOSIS — M546 Pain in thoracic spine: Secondary | ICD-10-CM | POA: Diagnosis not present

## 2022-05-11 DIAGNOSIS — M5136 Other intervertebral disc degeneration, lumbar region: Secondary | ICD-10-CM | POA: Diagnosis not present

## 2022-05-11 DIAGNOSIS — M25529 Pain in unspecified elbow: Secondary | ICD-10-CM | POA: Diagnosis not present

## 2022-05-11 DIAGNOSIS — M25569 Pain in unspecified knee: Secondary | ICD-10-CM | POA: Diagnosis not present

## 2022-05-11 DIAGNOSIS — M47812 Spondylosis without myelopathy or radiculopathy, cervical region: Secondary | ICD-10-CM | POA: Diagnosis not present

## 2022-05-11 DIAGNOSIS — M47814 Spondylosis without myelopathy or radiculopathy, thoracic region: Secondary | ICD-10-CM | POA: Diagnosis not present

## 2022-05-11 DIAGNOSIS — M797 Fibromyalgia: Secondary | ICD-10-CM | POA: Diagnosis not present

## 2022-05-25 DIAGNOSIS — M7918 Myalgia, other site: Secondary | ICD-10-CM | POA: Diagnosis not present

## 2022-06-01 ENCOUNTER — Other Ambulatory Visit: Payer: Self-pay

## 2022-06-01 DIAGNOSIS — Z122 Encounter for screening for malignant neoplasm of respiratory organs: Secondary | ICD-10-CM

## 2022-06-01 DIAGNOSIS — Z87891 Personal history of nicotine dependence: Secondary | ICD-10-CM

## 2022-06-05 DIAGNOSIS — F411 Generalized anxiety disorder: Secondary | ICD-10-CM | POA: Diagnosis not present

## 2022-06-05 DIAGNOSIS — G8921 Chronic pain due to trauma: Secondary | ICD-10-CM | POA: Diagnosis not present

## 2022-06-05 DIAGNOSIS — F339 Major depressive disorder, recurrent, unspecified: Secondary | ICD-10-CM | POA: Diagnosis not present

## 2022-06-05 DIAGNOSIS — F900 Attention-deficit hyperactivity disorder, predominantly inattentive type: Secondary | ICD-10-CM | POA: Diagnosis not present

## 2022-06-05 DIAGNOSIS — R69 Illness, unspecified: Secondary | ICD-10-CM | POA: Diagnosis not present

## 2022-06-08 DIAGNOSIS — M546 Pain in thoracic spine: Secondary | ICD-10-CM | POA: Diagnosis not present

## 2022-06-08 DIAGNOSIS — M5134 Other intervertebral disc degeneration, thoracic region: Secondary | ICD-10-CM | POA: Diagnosis not present

## 2022-06-08 DIAGNOSIS — M797 Fibromyalgia: Secondary | ICD-10-CM | POA: Diagnosis not present

## 2022-06-08 DIAGNOSIS — M47812 Spondylosis without myelopathy or radiculopathy, cervical region: Secondary | ICD-10-CM | POA: Diagnosis not present

## 2022-06-08 DIAGNOSIS — Z79891 Long term (current) use of opiate analgesic: Secondary | ICD-10-CM | POA: Diagnosis not present

## 2022-06-08 DIAGNOSIS — M25529 Pain in unspecified elbow: Secondary | ICD-10-CM | POA: Diagnosis not present

## 2022-06-08 DIAGNOSIS — M5136 Other intervertebral disc degeneration, lumbar region: Secondary | ICD-10-CM | POA: Diagnosis not present

## 2022-06-08 DIAGNOSIS — M961 Postlaminectomy syndrome, not elsewhere classified: Secondary | ICD-10-CM | POA: Diagnosis not present

## 2022-06-08 DIAGNOSIS — M25569 Pain in unspecified knee: Secondary | ICD-10-CM | POA: Diagnosis not present

## 2022-06-08 DIAGNOSIS — Z79899 Other long term (current) drug therapy: Secondary | ICD-10-CM | POA: Diagnosis not present

## 2022-06-08 DIAGNOSIS — M47814 Spondylosis without myelopathy or radiculopathy, thoracic region: Secondary | ICD-10-CM | POA: Diagnosis not present

## 2022-06-08 DIAGNOSIS — G894 Chronic pain syndrome: Secondary | ICD-10-CM | POA: Diagnosis not present

## 2022-06-13 ENCOUNTER — Other Ambulatory Visit (HOSPITAL_COMMUNITY): Payer: Self-pay

## 2022-06-14 ENCOUNTER — Telehealth: Payer: Self-pay

## 2022-06-14 ENCOUNTER — Other Ambulatory Visit (HOSPITAL_COMMUNITY): Payer: Self-pay

## 2022-06-14 NOTE — Telephone Encounter (Signed)
Pharmacy Patient Advocate Encounter  Prior Authorization for Repatha '140mg'$ /ml has been approved.    KEY# [id number] Effective dates: 1.1.24 through 12.31.24  Please note that rx Praluent in not listed on the patients formulary.

## 2022-07-03 ENCOUNTER — Encounter: Payer: Self-pay | Admitting: Family Medicine

## 2022-07-03 ENCOUNTER — Ambulatory Visit (INDEPENDENT_AMBULATORY_CARE_PROVIDER_SITE_OTHER): Payer: Medicare HMO | Admitting: Family Medicine

## 2022-07-03 VITALS — BP 106/66 | HR 87 | Temp 97.0°F | Ht 65.0 in | Wt 175.4 lb

## 2022-07-03 DIAGNOSIS — J0100 Acute maxillary sinusitis, unspecified: Secondary | ICD-10-CM

## 2022-07-03 DIAGNOSIS — E785 Hyperlipidemia, unspecified: Secondary | ICD-10-CM | POA: Diagnosis not present

## 2022-07-03 DIAGNOSIS — I10 Essential (primary) hypertension: Secondary | ICD-10-CM | POA: Diagnosis not present

## 2022-07-03 DIAGNOSIS — Z13220 Encounter for screening for lipoid disorders: Secondary | ICD-10-CM | POA: Diagnosis not present

## 2022-07-03 MED ORDER — CEFPROZIL 500 MG PO TABS: 500.0000 mg | ORAL_TABLET | Freq: Two times a day (BID) | ORAL | 0 refills | Status: DC

## 2022-07-03 MED ORDER — ATORVASTATIN CALCIUM 80 MG PO TABS: 80.0000 mg | ORAL_TABLET | Freq: Every day | ORAL | 3 refills | Status: DC

## 2022-07-03 NOTE — Addendum Note (Signed)
Addended by: Claretta Fraise on: 07/03/2022 02:49 PM   Modules accepted: Orders

## 2022-07-03 NOTE — Progress Notes (Signed)
Subjective:  Patient ID: Sarah Chapman, female    DOB: 11/03/1960  Age: 63 y.o. MRN: [id number]  CC: Medical Management of Chronic Issues   HPI Sarah Chapman presents for  follow-up of hypertension. Patient has no history of headache chest pain or shortness of breath or recent cough. Patient also denies symptoms of TIA such as focal numbness or weakness. Patient denies side effects from medication. States taking it regularly.  Sick for a week. Head congested. Chest hurt first 4 days. Then vomiting and diarrhea for 24 hours. Shortness of breath with exertion sometimes.  Symptoms include congestion, facial pain, nasal congestion, non productive cough, post nasal drip and sinus pressure. There is no fever, chills, or sweats. Onset of symptoms was a few days ago, gradually worsening since that time.   History Sarah Chapman has a past medical history of Allergic rhinitis, Back pain, Chronic headache, COPD (chronic obstructive pulmonary disease) ([place]), Emphysema of lung ([place]), Hyperlipidemia, Hypertension, Neck pain, Sleep apnea, and Vaginal Pap smear, abnormal.   She has a past surgical history that includes Neck surgery; Tonsillectomy; Cesarean section; Gynecologic cryosurgery; and Colonoscopy (N/A, 01/07/2015).   Her family history includes Bone cancer in her mother; Cancer in her maternal grandmother; Heart attack in her brother; Mental illness in her son.She reports that she quit smoking about 5 years ago. Her smoking use included cigarettes. She has a 33.00 pack-year smoking history. She has never used smokeless tobacco. She reports that she does not drink alcohol and does not use drugs.  Current Outpatient Medications on File Prior to Visit  Medication Sig Dispense Refill   albuterol (VENTOLIN HFA) 108 (90 Base) MCG/ACT inhaler INHALE 2 PUFFS EVERY 6 HOURS AS NEEDED FOR WHEEZING OR SHORTNESS OF BREATH 18 g 5   Alirocumab (PRALUENT) 150 MG/ML SOAJ Inject 1 mL into the skin every 14 (fourteen) days. 2 mL  11   ALPRAZolam (XANAX) 0.5 MG tablet Take 0.25-0.5 mg by mouth daily as needed.     Armodafinil 250 MG tablet Take 250 mg by mouth daily.     aspirin 81 MG EC tablet Take 1 tablet by mouth daily.     atorvastatin (LIPITOR) 80 MG tablet Take 1 tablet (80 mg total) by mouth daily. 90 tablet 3   BOTOX 200 units SOLR Every 3 months     buPROPion HCl ER, XL, 450 MG TB24 Take 450 mg by mouth daily.     dexlansoprazole (DEXILANT) 60 MG capsule Take 1 capsule (60 mg total) by mouth daily. 90 capsule 3   ezetimibe (ZETIA) 10 MG tablet TAKE 1 TABLET DAILY 30 tablet 3   Fluticasone-Umeclidin-Vilant (TRELEGY ELLIPTA) 200-62.5-25 MCG/ACT AEPB INHALE 1 PUFF ONCE A DAY 60 each 2   HYDROcodone-acetaminophen (NORCO) 10-325 MG tablet Take 1 tablet by mouth 4 (four) times daily as needed.     ipratropium-albuterol (DUONEB) 0.5-2.5 (3) MG/3ML SOLN Take 3 mLs by nebulization every 6 (six) hours as needed. 360 mL 3   Loratadine 10 MG CAPS 1 Once A Day Oral     naloxone (NARCAN) nasal spray 4 mg/0.1 mL      Nebulizers (HOMENEB WITH SIDESTREAM) MISC See admin instructions.     nortriptyline (PAMELOR) 75 MG capsule Take 75 mg by mouth at bedtime.     olmesartan (BENICAR) 40 MG tablet Take 1 tablet (40 mg total) by mouth daily. 90 tablet 0   tiZANidine (ZANAFLEX) 4 MG tablet Take by mouth.     Ubrogepant (UBRELVY PO) Take by  mouth.     No current facility-administered medications on file prior to visit.    ROS Review of Systems  Constitutional: Negative.   HENT: Negative.    Eyes:  Negative for visual disturbance.  Respiratory:  Negative for shortness of breath.   Cardiovascular:  Negative for chest pain.  Gastrointestinal:  Negative for abdominal pain.  Musculoskeletal:  Negative for arthralgias.    Objective:  BP 106/66   Pulse 87   Temp (!) 97 F (36.1 C)   Ht '5\' 5"'$  (1.651 m)   Wt 175 lb 6.4 oz (79.6 kg)   SpO2 98%   BMI 29.19 kg/m   BP Readings from Last 3 Encounters:  07/03/22 106/66   02/07/22 133/78  01/18/22 128/80    Wt Readings from Last 3 Encounters:  07/03/22 175 lb 6.4 oz (79.6 kg)  02/07/22 173 lb 3.2 oz (78.6 kg)  01/25/22 170 lb (77.1 kg)     Physical Exam Constitutional:      General: She is not in acute distress.    Appearance: She is well-developed.  Cardiovascular:     Rate and Rhythm: Normal rate and regular rhythm.  Pulmonary:     Breath sounds: Normal breath sounds.  Musculoskeletal:        General: Normal range of motion.  Skin:    General: Skin is warm and dry.  Neurological:     Mental Status: She is alert and oriented to person, place, and time.       Assessment & Plan:   Sarah Chapman was seen today for medical management of chronic issues.  Diagnoses and all orders for this visit:  Lipid screening -     Lipid panel  Essential hypertension -     CBC with Differential/Platelet -     CMP14+EGFR  Hyperlipidemia, unspecified hyperlipidemia type  Acute maxillary sinusitis, recurrence not specified -     COVID-19, Flu A+B and RSV  Other orders -     cefPROZIL (CEFZIL) 500 MG tablet; Take 1 tablet (500 mg total) by mouth 2 (two) times daily. For infection. Take all of this medication.   Allergies as of 07/03/2022       Reactions   Clindamycin/lincomycin Rash   Penicillins    Unknown        Medication List        Accurate as of July 03, 2022  2:47 PM. If you have any questions, ask your nurse or doctor.          albuterol 108 (90 Base) MCG/ACT inhaler Commonly known as: VENTOLIN HFA INHALE 2 PUFFS EVERY 6 HOURS AS NEEDED FOR WHEEZING OR SHORTNESS OF BREATH   ALPRAZolam 0.5 MG tablet Commonly known as: XANAX Take 0.25-0.5 mg by mouth daily as needed.   Armodafinil 250 MG tablet Take 250 mg by mouth daily.   aspirin EC 81 MG tablet Take 1 tablet by mouth daily.   atorvastatin 80 MG tablet Commonly known as: LIPITOR Take 1 tablet (80 mg total) by mouth daily.   Botox 200 units injection Generic drug:  botulinum toxin Type A Every 3 months   buPROPion HCl ER (XL) 450 MG Tb24 Take 450 mg by mouth daily.   cefPROZIL 500 MG tablet Commonly known as: CEFZIL Take 1 tablet (500 mg total) by mouth 2 (two) times daily. For infection. Take all of this medication. Started by: Claretta Fraise, MD   dexlansoprazole 60 MG capsule Commonly known as: Dexilant Take 1 capsule (60 mg total) by  mouth daily.   ezetimibe 10 MG tablet Commonly known as: ZETIA TAKE 1 TABLET DAILY   Fluticasone-Umeclidin-Vilant 200-62.5-25 MCG/ACT Aepb Commonly known as: Trelegy Ellipta INHALE 1 PUFF ONCE A DAY   HomeNeb with Sidestream Misc See admin instructions.   HYDROcodone-acetaminophen 10-325 MG tablet Commonly known as: NORCO Take 1 tablet by mouth 4 (four) times daily as needed.   ipratropium-albuterol 0.5-2.5 (3) MG/3ML Soln Commonly known as: DUONEB Take 3 mLs by nebulization every 6 (six) hours as needed.   Loratadine 10 MG Caps 1 Once A Day Oral   naloxone 4 MG/0.1ML Liqd nasal spray kit Commonly known as: NARCAN   nortriptyline 75 MG capsule Commonly known as: PAMELOR Take 75 mg by mouth at bedtime.   olmesartan 40 MG tablet Commonly known as: BENICAR Take 1 tablet (40 mg total) by mouth daily.   Praluent 150 MG/ML Soaj Generic drug: Alirocumab Inject 1 mL into the skin every 14 (fourteen) days.   tiZANidine 4 MG tablet Commonly known as: ZANAFLEX Take by mouth.   UBRELVY PO Take by mouth.        Meds ordered this encounter  Medications   cefPROZIL (CEFZIL) 500 MG tablet    Sig: Take 1 tablet (500 mg total) by mouth 2 (two) times daily. For infection. Take all of this medication.    Dispense:  20 tablet    Refill:  0      Follow-up: Return in about 6 months (around 01/01/2023).  Claretta Fraise, M.D.

## 2022-07-04 LAB — COVID-19, FLU A+B AND RSV
Influenza A, NAA: NOT DETECTED
Influenza B, NAA: NOT DETECTED
RSV, NAA: NOT DETECTED
SARS-CoV-2, NAA: NOT DETECTED

## 2022-07-05 ENCOUNTER — Other Ambulatory Visit: Payer: Self-pay | Admitting: Family Medicine

## 2022-07-05 ENCOUNTER — Telehealth: Payer: Self-pay | Admitting: Family Medicine

## 2022-07-05 MED ORDER — ONDANSETRON 8 MG PO TBDP: 8.0000 mg | ORAL_TABLET | Freq: Four times a day (QID) | ORAL | 1 refills | Status: DC | PRN

## 2022-07-05 MED ORDER — DIPHENOXYLATE-ATROPINE 2.5-0.025 MG PO TABS: 2.0000 | ORAL_TABLET | Freq: Four times a day (QID) | ORAL | 0 refills | Status: DC | PRN

## 2022-07-05 NOTE — Telephone Encounter (Signed)
Patient was seen on 2/5 and stated that she discussed stomach pain while she was here. She would like to know if something can be called in to help treat. Please call back and advise.

## 2022-07-05 NOTE — Telephone Encounter (Signed)
Please let the patient know that I sent their prescription to their pharmacy. Thanks, WS 

## 2022-07-05 NOTE — Telephone Encounter (Signed)
Patient aware.

## 2022-07-13 DIAGNOSIS — G894 Chronic pain syndrome: Secondary | ICD-10-CM | POA: Diagnosis not present

## 2022-07-13 DIAGNOSIS — M546 Pain in thoracic spine: Secondary | ICD-10-CM | POA: Diagnosis not present

## 2022-07-13 DIAGNOSIS — M797 Fibromyalgia: Secondary | ICD-10-CM | POA: Diagnosis not present

## 2022-07-13 DIAGNOSIS — M25569 Pain in unspecified knee: Secondary | ICD-10-CM | POA: Diagnosis not present

## 2022-07-13 DIAGNOSIS — M47814 Spondylosis without myelopathy or radiculopathy, thoracic region: Secondary | ICD-10-CM | POA: Diagnosis not present

## 2022-07-13 DIAGNOSIS — M47812 Spondylosis without myelopathy or radiculopathy, cervical region: Secondary | ICD-10-CM | POA: Diagnosis not present

## 2022-07-13 DIAGNOSIS — Z79899 Other long term (current) drug therapy: Secondary | ICD-10-CM | POA: Diagnosis not present

## 2022-07-13 DIAGNOSIS — M25529 Pain in unspecified elbow: Secondary | ICD-10-CM | POA: Diagnosis not present

## 2022-07-13 DIAGNOSIS — M5134 Other intervertebral disc degeneration, thoracic region: Secondary | ICD-10-CM | POA: Diagnosis not present

## 2022-07-13 DIAGNOSIS — Z79891 Long term (current) use of opiate analgesic: Secondary | ICD-10-CM | POA: Diagnosis not present

## 2022-07-13 DIAGNOSIS — M5136 Other intervertebral disc degeneration, lumbar region: Secondary | ICD-10-CM | POA: Diagnosis not present

## 2022-07-13 DIAGNOSIS — M961 Postlaminectomy syndrome, not elsewhere classified: Secondary | ICD-10-CM | POA: Diagnosis not present

## 2022-07-20 ENCOUNTER — Other Ambulatory Visit: Payer: Self-pay | Admitting: Family Medicine

## 2022-07-20 ENCOUNTER — Ambulatory Visit (HOSPITAL_COMMUNITY)
Admission: RE | Admit: 2022-07-20 | Discharge: 2022-07-20 | Disposition: A | Discharge status: Home / Self Care | Payer: Medicare HMO | Source: Ambulatory Visit | Attending: Acute Care | Admitting: Acute Care

## 2022-07-20 DIAGNOSIS — J432 Centrilobular emphysema: Secondary | ICD-10-CM | POA: Diagnosis not present

## 2022-07-20 DIAGNOSIS — Z87891 Personal history of nicotine dependence: Secondary | ICD-10-CM | POA: Insufficient documentation

## 2022-07-20 DIAGNOSIS — I700 Atherosclerosis of aorta: Secondary | ICD-10-CM | POA: Insufficient documentation

## 2022-07-20 DIAGNOSIS — I2510 Atherosclerotic heart disease of native coronary artery without angina pectoris: Secondary | ICD-10-CM | POA: Insufficient documentation

## 2022-07-20 DIAGNOSIS — Z122 Encounter for screening for malignant neoplasm of respiratory organs: Secondary | ICD-10-CM | POA: Insufficient documentation

## 2022-07-21 ENCOUNTER — Other Ambulatory Visit: Payer: Self-pay | Admitting: Acute Care

## 2022-07-21 ENCOUNTER — Encounter: Payer: Self-pay | Admitting: Family Medicine

## 2022-07-21 DIAGNOSIS — Z122 Encounter for screening for malignant neoplasm of respiratory organs: Secondary | ICD-10-CM

## 2022-07-21 DIAGNOSIS — Z87891 Personal history of nicotine dependence: Secondary | ICD-10-CM

## 2022-07-21 DIAGNOSIS — I700 Atherosclerosis of aorta: Secondary | ICD-10-CM | POA: Insufficient documentation

## 2022-07-22 ENCOUNTER — Other Ambulatory Visit: Payer: Self-pay | Admitting: Cardiology

## 2022-07-24 NOTE — Telephone Encounter (Signed)
Rx refill sent to pharmacy. 

## 2022-08-08 ENCOUNTER — Other Ambulatory Visit: Payer: Self-pay | Admitting: Cardiology

## 2022-08-08 DIAGNOSIS — M25529 Pain in unspecified elbow: Secondary | ICD-10-CM | POA: Diagnosis not present

## 2022-08-08 DIAGNOSIS — Z79899 Other long term (current) drug therapy: Secondary | ICD-10-CM | POA: Diagnosis not present

## 2022-08-08 DIAGNOSIS — M961 Postlaminectomy syndrome, not elsewhere classified: Secondary | ICD-10-CM | POA: Diagnosis not present

## 2022-08-08 DIAGNOSIS — M5459 Other low back pain: Secondary | ICD-10-CM | POA: Diagnosis not present

## 2022-08-08 DIAGNOSIS — Z79891 Long term (current) use of opiate analgesic: Secondary | ICD-10-CM | POA: Diagnosis not present

## 2022-08-08 DIAGNOSIS — M47814 Spondylosis without myelopathy or radiculopathy, thoracic region: Secondary | ICD-10-CM | POA: Diagnosis not present

## 2022-08-08 DIAGNOSIS — M546 Pain in thoracic spine: Secondary | ICD-10-CM | POA: Diagnosis not present

## 2022-08-08 DIAGNOSIS — M5134 Other intervertebral disc degeneration, thoracic region: Secondary | ICD-10-CM | POA: Diagnosis not present

## 2022-08-08 DIAGNOSIS — M797 Fibromyalgia: Secondary | ICD-10-CM | POA: Diagnosis not present

## 2022-08-08 DIAGNOSIS — M5136 Other intervertebral disc degeneration, lumbar region: Secondary | ICD-10-CM | POA: Diagnosis not present

## 2022-08-08 DIAGNOSIS — M47812 Spondylosis without myelopathy or radiculopathy, cervical region: Secondary | ICD-10-CM | POA: Diagnosis not present

## 2022-08-08 DIAGNOSIS — G894 Chronic pain syndrome: Secondary | ICD-10-CM | POA: Diagnosis not present

## 2022-08-23 ENCOUNTER — Ambulatory Visit (INDEPENDENT_AMBULATORY_CARE_PROVIDER_SITE_OTHER): Payer: Medicare HMO | Admitting: Family Medicine

## 2022-08-23 ENCOUNTER — Encounter: Payer: Self-pay | Admitting: Family Medicine

## 2022-08-23 VITALS — BP 133/83 | HR 93 | Temp 97.5°F | Ht 65.0 in | Wt 176.4 lb

## 2022-08-23 DIAGNOSIS — J329 Chronic sinusitis, unspecified: Secondary | ICD-10-CM | POA: Diagnosis not present

## 2022-08-23 DIAGNOSIS — J40 Bronchitis, not specified as acute or chronic: Secondary | ICD-10-CM

## 2022-08-23 MED ORDER — PREDNISONE 10 MG PO TABS: ORAL_TABLET | ORAL | 0 refills | Status: DC

## 2022-08-23 MED ORDER — MOXIFLOXACIN HCL 400 MG PO TABS: 400.0000 mg | ORAL_TABLET | Freq: Every day | ORAL | 0 refills | Status: DC

## 2022-08-23 MED ORDER — PSEUDOEPHEDRINE-GUAIFENESIN ER 120-1200 MG PO TB12: 1.0000 | ORAL_TABLET | Freq: Two times a day (BID) | ORAL | 0 refills | Status: DC

## 2022-08-23 NOTE — Progress Notes (Signed)
Subjective:  Patient ID: Sarah Chapman, female    DOB: 1960-08-17  Age: 62 y.o. MRN: [id number]  CC: Cough, Nasal Congestion, and Shortness of Breath   HPI Sarah Chapman presents for Symptoms include congestion, facial pain, nasal congestion, non productive cough, and sinus pressure. There is no fever, chills, or sweats. Susternal Chest pain, pressure for 3 days. Onset of symptoms was three days ago, gradually worsening since that time.       07/03/2022    2:16 PM 07/03/2022    2:11 PM 12/29/2021    3:01 PM  Depression screen PHQ 2/9  Decreased Interest 3 0 2  Down, Depressed, Hopeless 3 0 2  PHQ - 2 Score 6 0 4  Altered sleeping 0  0  Tired, decreased energy 3  2  Change in appetite 2  1  Feeling bad or failure about yourself  0  1  Trouble concentrating 3  2  Moving slowly or fidgety/restless 1  1  Suicidal thoughts 0  0  PHQ-9 Score 15  11  Difficult doing work/chores Extremely dIfficult  Somewhat difficult    History Sarah Chapman has a past medical history of Allergic rhinitis, Back pain, Chronic headache, COPD (chronic obstructive pulmonary disease) ([place]), Emphysema of lung ([place]), Hyperlipidemia, Hypertension, Neck pain, Sleep apnea, and Vaginal Pap smear, abnormal.   She has a past surgical history that includes Neck surgery; Tonsillectomy; Cesarean section; Gynecologic cryosurgery; and Colonoscopy (N/A, 01/07/2015).   Sarah Chapman family history includes Bone cancer in Sarah Chapman mother; Cancer in Sarah Chapman maternal grandmother; Heart attack in Sarah Chapman brother; Mental illness in Sarah Chapman son.She reports that she quit smoking about 6 years ago. Sarah Chapman smoking use included cigarettes. She has a 33.00 pack-year smoking history. She has never used smokeless tobacco. She reports that she does not drink alcohol and does not use drugs.    ROS Review of Systems  Constitutional:  Negative for activity change, appetite change, chills and fever.  HENT:  Positive for congestion, ear pain, rhinorrhea, sinus pressure and sore  throat. Negative for hearing loss and postnasal drip.   Respiratory:  Positive for chest tightness and shortness of breath.   Skin:  Negative for rash.    Objective:  BP 133/83   Pulse 93   Temp (!) 97.5 F (36.4 C)   Ht 5\' 5"  (1.651 m)   Wt 176 lb 6.4 oz (80 kg)   SpO2 98%   BMI 29.35 kg/m   BP Readings from Last 3 Encounters:  08/23/22 133/83  07/03/22 106/66  02/07/22 133/78    Wt Readings from Last 3 Encounters:  08/23/22 176 lb 6.4 oz (80 kg)  07/03/22 175 lb 6.4 oz (79.6 kg)  02/07/22 173 lb 3.2 oz (78.6 kg)     Physical Exam Constitutional:      General: She is not in acute distress.    Appearance: She is well-developed.  HENT:     Head: Normocephalic and atraumatic.     Right Ear: Tympanic membrane and external ear normal. No decreased hearing noted.     Left Ear: Tympanic membrane and external ear normal. No decreased hearing noted.     Nose: Mucosal edema present.     Right Sinus: No frontal sinus tenderness.     Left Sinus: No frontal sinus tenderness.     Mouth/Throat:     Pharynx: No oropharyngeal exudate or posterior oropharyngeal erythema.  Neck:     Meningeal: Brudzinski's sign absent.  Cardiovascular:  Rate and Rhythm: Normal rate and regular rhythm.  Pulmonary:     Effort: No respiratory distress.     Breath sounds: Normal breath sounds.  Musculoskeletal:        General: Normal range of motion.  Lymphadenopathy:     Head:     Right side of head: No preauricular adenopathy.     Left side of head: No preauricular adenopathy.     Cervical:     Right cervical: No superficial cervical adenopathy.    Left cervical: No superficial cervical adenopathy.  Skin:    General: Skin is warm and dry.  Neurological:     Mental Status: She is alert and oriented to person, place, and time.       Assessment & Plan:   Sarah Chapman was seen today for cough, nasal congestion and shortness of breath.  Diagnoses and all orders for this  visit:  Sinobronchitis  Other orders -     moxifloxacin (AVELOX) 400 MG tablet; Take 1 tablet (400 mg total) by mouth daily. -     Pseudoephedrine-Guaifenesin [phone] MG TB12; Take 1 tablet by mouth 2 (two) times daily. For congestion -     predniSONE (DELTASONE) 10 MG tablet; Take 5 daily for 2 days followed by 4,3,2 and 1 for 2 days each.       I am having Sarah Chapman start on moxifloxacin, Pseudoephedrine-Guaifenesin, and predniSONE. I am also having Sarah Chapman maintain Sarah Chapman aspirin EC, buPROPion HCl ER (XL), nortriptyline, Ubrogepant (UBRELVY PO), Armodafinil, ipratropium-albuterol, Fluticasone-Umeclidin-Vilant, HYDROcodone-acetaminophen, HomeNeb with Sidestream, Loratadine, naloxone, Botox, tiZANidine, albuterol, ALPRAZolam, dexlansoprazole, Praluent, cefPROZIL, atorvastatin, diphenoxylate-atropine, ondansetron, olmesartan, and ezetimibe.  Allergies as of 08/23/2022       Reactions   Clindamycin/lincomycin Rash   Penicillins    Unknown        Medication List        Accurate as of August 23, 2022  4:50 PM. If you have any questions, ask your nurse or doctor.          albuterol 108 (90 Base) MCG/ACT inhaler Commonly known as: VENTOLIN HFA INHALE 2 PUFFS EVERY 6 HOURS AS NEEDED FOR WHEEZING OR SHORTNESS OF BREATH   ALPRAZolam 0.5 MG tablet Commonly known as: XANAX Take 0.25-0.5 mg by mouth daily as needed.   Armodafinil 250 MG tablet Take 250 mg by mouth daily.   aspirin EC 81 MG tablet Take 1 tablet by mouth daily.   atorvastatin 80 MG tablet Commonly known as: LIPITOR Take 1 tablet (80 mg total) by mouth daily.   Botox 200 units injection Generic drug: botulinum toxin Type A Every 3 months   buPROPion HCl ER (XL) 450 MG Tb24 Take 450 mg by mouth daily.   cefPROZIL 500 MG tablet Commonly known as: CEFZIL Take 1 tablet (500 mg total) by mouth 2 (two) times daily. For infection. Take all of this medication.   dexlansoprazole 60 MG capsule Commonly known as:  Dexilant Take 1 capsule (60 mg total) by mouth daily.   diphenoxylate-atropine 2.5-0.025 MG tablet Commonly known as: Lomotil Take 2 tablets by mouth 4 (four) times daily as needed for diarrhea or loose stools.   ezetimibe 10 MG tablet Commonly known as: ZETIA TAKE 1 TABLET DAILY   Fluticasone-Umeclidin-Vilant 200-62.5-25 MCG/ACT Aepb Commonly known as: Trelegy Ellipta INHALE 1 PUFF ONCE A DAY   HomeNeb with Sidestream Misc See admin instructions.   HYDROcodone-acetaminophen 10-325 MG tablet Commonly known as: NORCO Take 1 tablet by mouth 4 (four) times daily as  needed.   ipratropium-albuterol 0.5-2.5 (3) MG/3ML Soln Commonly known as: DUONEB Take 3 mLs by nebulization every 6 (six) hours as needed.   Loratadine 10 MG Caps 1 Once A Day Oral   moxifloxacin 400 MG tablet Commonly known as: AVELOX Take 1 tablet (400 mg total) by mouth daily. Started by: Claretta Fraise, MD   naloxone 4 MG/0.1ML Liqd nasal spray kit Commonly known as: NARCAN   nortriptyline 75 MG capsule Commonly known as: PAMELOR Take 75 mg by mouth at bedtime.   olmesartan 40 MG tablet Commonly known as: BENICAR TAKE ONE TABLET EVERY DAY   ondansetron 8 MG disintegrating tablet Commonly known as: ZOFRAN-ODT Take 1 tablet (8 mg total) by mouth every 6 (six) hours as needed for nausea or vomiting.   Praluent 150 MG/ML Soaj Generic drug: Alirocumab Inject 1 mL into the skin every 14 (fourteen) days.   predniSONE 10 MG tablet Commonly known as: DELTASONE Take 5 daily for 2 days followed by 4,3,2 and 1 for 2 days each. Started by: Claretta Fraise, MD   Pseudoephedrine-Guaifenesin [phone] MG Tb12 Take 1 tablet by mouth 2 (two) times daily. For congestion Started by: Claretta Fraise, MD   tiZANidine 4 MG tablet Commonly known as: ZANAFLEX Take by mouth.   UBRELVY PO Take by mouth.         Follow-up: No follow-ups on file.  Claretta Fraise, M.D.

## 2022-08-30 DIAGNOSIS — F339 Major depressive disorder, recurrent, unspecified: Secondary | ICD-10-CM | POA: Diagnosis not present

## 2022-08-30 DIAGNOSIS — F900 Attention-deficit hyperactivity disorder, predominantly inattentive type: Secondary | ICD-10-CM | POA: Diagnosis not present

## 2022-08-30 DIAGNOSIS — F411 Generalized anxiety disorder: Secondary | ICD-10-CM | POA: Diagnosis not present

## 2022-08-30 DIAGNOSIS — G8921 Chronic pain due to trauma: Secondary | ICD-10-CM | POA: Diagnosis not present

## 2022-08-30 DIAGNOSIS — R69 Illness, unspecified: Secondary | ICD-10-CM | POA: Diagnosis not present

## 2022-08-31 ENCOUNTER — Other Ambulatory Visit: Payer: Self-pay | Admitting: *Deleted

## 2022-08-31 MED ORDER — OLMESARTAN MEDOXOMIL 40 MG PO TABS: 40.0000 mg | ORAL_TABLET | Freq: Every day | ORAL | 1 refills | Status: DC

## 2022-09-04 DIAGNOSIS — M5134 Other intervertebral disc degeneration, thoracic region: Secondary | ICD-10-CM | POA: Diagnosis not present

## 2022-09-04 DIAGNOSIS — M47812 Spondylosis without myelopathy or radiculopathy, cervical region: Secondary | ICD-10-CM | POA: Diagnosis not present

## 2022-09-04 DIAGNOSIS — M961 Postlaminectomy syndrome, not elsewhere classified: Secondary | ICD-10-CM | POA: Diagnosis not present

## 2022-09-04 DIAGNOSIS — Z79891 Long term (current) use of opiate analgesic: Secondary | ICD-10-CM | POA: Diagnosis not present

## 2022-09-04 DIAGNOSIS — M25569 Pain in unspecified knee: Secondary | ICD-10-CM | POA: Diagnosis not present

## 2022-09-04 DIAGNOSIS — M5136 Other intervertebral disc degeneration, lumbar region: Secondary | ICD-10-CM | POA: Diagnosis not present

## 2022-09-04 DIAGNOSIS — M5459 Other low back pain: Secondary | ICD-10-CM | POA: Diagnosis not present

## 2022-09-04 DIAGNOSIS — M47814 Spondylosis without myelopathy or radiculopathy, thoracic region: Secondary | ICD-10-CM | POA: Diagnosis not present

## 2022-09-04 DIAGNOSIS — G894 Chronic pain syndrome: Secondary | ICD-10-CM | POA: Diagnosis not present

## 2022-09-04 DIAGNOSIS — M546 Pain in thoracic spine: Secondary | ICD-10-CM | POA: Diagnosis not present

## 2022-09-04 DIAGNOSIS — M25529 Pain in unspecified elbow: Secondary | ICD-10-CM | POA: Diagnosis not present

## 2022-09-04 DIAGNOSIS — M797 Fibromyalgia: Secondary | ICD-10-CM | POA: Diagnosis not present

## 2022-09-11 ENCOUNTER — Ambulatory Visit (INDEPENDENT_AMBULATORY_CARE_PROVIDER_SITE_OTHER): Payer: Medicare HMO

## 2022-09-11 VITALS — Ht 65.0 in | Wt 178.0 lb

## 2022-09-11 DIAGNOSIS — Z0000 Encounter for general adult medical examination without abnormal findings: Secondary | ICD-10-CM

## 2022-09-11 NOTE — Progress Notes (Signed)
Subjective:   Sarah Chapman is a 62 y.o. female who presents for an Initial Medicare Annual Wellness Visit. I connected with  Sarah Chapman on 09/11/22 by a audio enabled telemedicine application and verified that I am speaking with the correct person using two identifiers.  Patient Location: Home  Provider Location: Home Office  I discussed the limitations of evaluation and management by telemedicine. The patient expressed understanding and agreed to proceed.  Review of Systems     Cardiac Risk Factors include: advanced age (>69men, >4 women)     Objective:    Today's Vitals   09/11/22 1535  Weight: 178 lb (80.7 kg)  Height: 5\' 5"  (1.651 m)   Body mass index is 29.62 kg/m.     09/11/2022    3:40 PM 09/05/2021    3:04 PM 04/03/2014    3:41 PM  Advanced Directives  Does Patient Have a Medical Advance Directive? No No No  Would patient like information on creating a medical advance directive? No - Patient declined No - Patient declined No - patient declined information    Current Medications (verified) Outpatient Encounter Medications as of 09/11/2022  Medication Sig   albuterol (VENTOLIN HFA) 108 (90 Base) MCG/ACT inhaler INHALE 2 PUFFS EVERY 6 HOURS AS NEEDED FOR WHEEZING OR SHORTNESS OF BREATH   Alirocumab (PRALUENT) 150 MG/ML SOAJ Inject 1 mL into the skin every 14 (fourteen) days.   ALPRAZolam (XANAX) 0.5 MG tablet Take 0.25-0.5 mg by mouth daily as needed.   Armodafinil 250 MG tablet Take 250 mg by mouth daily.   aspirin 81 MG EC tablet Take 1 tablet by mouth daily.   atorvastatin (LIPITOR) 80 MG tablet Take 1 tablet (80 mg total) by mouth daily.   BOTOX 200 units SOLR Every 3 months   buPROPion HCl ER, XL, 450 MG TB24 Take 450 mg by mouth daily.   cefPROZIL (CEFZIL) 500 MG tablet Take 1 tablet (500 mg total) by mouth 2 (two) times daily. For infection. Take all of this medication.   dexlansoprazole (DEXILANT) 60 MG capsule Take 1 capsule (60 mg total) by mouth daily.    diphenoxylate-atropine (LOMOTIL) 2.5-0.025 MG tablet Take 2 tablets by mouth 4 (four) times daily as needed for diarrhea or loose stools.   ezetimibe (ZETIA) 10 MG tablet TAKE 1 TABLET DAILY   Fluticasone-Umeclidin-Vilant (TRELEGY ELLIPTA) 200-62.5-25 MCG/ACT AEPB INHALE 1 PUFF ONCE A DAY   HYDROcodone-acetaminophen (NORCO) 10-325 MG tablet Take 1 tablet by mouth 4 (four) times daily as needed.   ipratropium-albuterol (DUONEB) 0.5-2.5 (3) MG/3ML SOLN Take 3 mLs by nebulization every 6 (six) hours as needed.   Loratadine 10 MG CAPS 1 Once A Day Oral   moxifloxacin (AVELOX) 400 MG tablet Take 1 tablet (400 mg total) by mouth daily.   naloxone (NARCAN) nasal spray 4 mg/0.1 mL    Nebulizers (HOMENEB WITH SIDESTREAM) MISC See admin instructions.   nortriptyline (PAMELOR) 75 MG capsule Take 75 mg by mouth at bedtime.   olmesartan (BENICAR) 40 MG tablet Take 1 tablet (40 mg total) by mouth daily.   ondansetron (ZOFRAN-ODT) 8 MG disintegrating tablet Take 1 tablet (8 mg total) by mouth every 6 (six) hours as needed for nausea or vomiting.   predniSONE (DELTASONE) 10 MG tablet Take 5 daily for 2 days followed by 4,3,2 and 1 for 2 days each.   Pseudoephedrine-Guaifenesin 6091936050 MG TB12 Take 1 tablet by mouth 2 (two) times daily. For congestion   tiZANidine (ZANAFLEX) 4 MG  tablet Take by mouth.   Ubrogepant (UBRELVY PO) Take by mouth.   No facility-administered encounter medications on file as of 09/11/2022.    Allergies (verified) Clindamycin/lincomycin and Penicillins   History: Past Medical History:  Diagnosis Date   Allergic rhinitis    Back pain    Chronic headache    COPD (chronic obstructive pulmonary disease)    Emphysema of lung    Hyperlipidemia    Hypertension    Neck pain    Sleep apnea    Vaginal Pap smear, abnormal    Past Surgical History:  Procedure Laterality Date   CESAREAN SECTION     COLONOSCOPY N/A 01/07/2015   internal hemorrhoids, diverticulosis in sigmoid  colon, exam otherwise normal, no biopsies.   GYNECOLOGIC CRYOSURGERY     NECK SURGERY     TONSILLECTOMY     Family History  Problem Relation Age of Onset   Bone cancer Mother    Cancer Maternal Grandmother    Heart attack Brother    Mental illness Son    Colon cancer Neg Hx    Colon polyps Neg Hx    Breast cancer Neg Hx    Social History   Socioeconomic History   Marital status: Divorced    Spouse name: Not on file   Number of children: Not on file   Years of education: Not on file   Highest education level: Not on file  Occupational History   Not on file  Tobacco Use   Smoking status: Former    Packs/day: 1.00    Years: 33.00    Additional pack years: 0.00    Total pack years: 33.00    Types: Cigarettes    Quit date: 07/25/2016    Years since quitting: 6.1   Smokeless tobacco: Never  Vaping Use   Vaping Use: Never used  Substance and Sexual Activity   Alcohol use: No   Drug use: No   Sexual activity: Not Currently    Birth control/protection: Post-menopausal  Other Topics Concern   Not on file  Social History Narrative   Not on file   Social Determinants of Health   Financial Resource Strain: Low Risk  (09/11/2022)   Overall Financial Resource Strain (CARDIA)    Difficulty of Paying Living Expenses: Not hard at all  Food Insecurity: No Food Insecurity (09/11/2022)   Hunger Vital Sign    Worried About Running Out of Food in the Last Year: Never true    Ran Out of Food in the Last Year: Never true  Transportation Needs: No Transportation Needs (09/11/2022)   PRAPARE - Administrator, Civil Service (Medical): No    Lack of Transportation (Non-Medical): No  Physical Activity: Insufficiently Active (09/11/2022)   Exercise Vital Sign    Days of Exercise per Week: 3 days    Minutes of Exercise per Session: 30 min  Stress: No Stress Concern Present (09/11/2022)   [employer] of Occupational Health - Occupational Stress Questionnaire    Feeling of  Stress : Not at all  Social Connections: Socially Integrated (09/11/2022)   Social Connection and Isolation Panel [NHANES]    Frequency of Communication with Friends and Family: More than three times a week    Frequency of Social Gatherings with Friends and Family: More than three times a week    Attends Religious Services: More than 4 times per year    Active Member of [employer] or Organizations: Yes    Attends Banker  Meetings: More than 4 times per year    Marital Status: Living with partner    Tobacco Counseling Counseling given: Not Answered   Clinical Intake:  Pre-visit preparation completed: Yes  Pain : No/denies pain     Nutritional Risks: None Diabetes: No  How often do you need to have someone help you when you [place] instructions, pamphlets, or other written materials from your doctor or pharmacy?: 1 - Never  Diabetic?no   Interpreter Needed?: No  Information entered by :: Renie Ora, LPN   Activities of Daily Living    09/11/2022    3:40 PM  In your present state of health, do you have any difficulty performing the following activities:  Hearing? 0  Vision? 0  Difficulty concentrating or making decisions? 0  Walking or climbing stairs? 0  Dressing or bathing? 0  Doing errands, shopping? 0  Preparing Food and eating ? N  Using the Toilet? N  In the past six months, have you accidently leaked urine? N  Do you have problems with loss of bowel control? N  Managing your Medications? N  Managing your Finances? N  Housekeeping or managing your Housekeeping? N    Patient Care Team: Mechele Claude, MD as PCP - General (Family Medicine) Little Ishikawa, MD as PCP - Cardiology (Cardiology) West Bali, MD (Inactive) as Consulting Physician (Gastroenterology) Azalee Course, [place] (Cardiology)  Indicate any recent Medical Services you may have received from other than [facility] providers in the past year (date may be approximate).     Assessment:    This is a routine wellness examination for Bobette.  Hearing/Vision screen Vision Screening - Comments:: Wears rx glasses - up to date with routine eye exams [facility] [place]   Dietary issues and exercise activities discussed: Current Exercise Habits: Home exercise routine, Type of exercise: walking, Time (Minutes): 30, Frequency (Times/Week): 3, Weekly Exercise (Minutes/Week): 90, Intensity: Mild, Exercise limited by: respiratory conditions(s)   Goals Addressed             This Visit's Progress    DIET - INCREASE WATER INTAKE         Depression Screen    09/11/2022    3:39 PM 08/23/2022    4:54 PM 07/03/2022    2:16 PM 07/03/2022    2:11 PM 12/29/2021    3:01 PM 12/29/2021    2:57 PM 09/05/2021    3:01 PM  PHQ 2/9 Scores  PHQ - 2 Score 0 6 6 0 4 0 1  PHQ- 9 Score 0 21 15  11       Fall Risk    09/11/2022    3:37 PM 08/23/2022    4:35 PM 07/03/2022    2:11 PM 12/29/2021    2:57 PM 09/05/2021    3:06 PM  Fall Risk   Falls in the past year? 0 0 0 0 1  Number falls in past yr: 0    0  Injury with Fall? 0    1  Risk for fall due to : No Fall Risks    Impaired balance/gait;History of fall(s)  Follow up Falls prevention discussed    Falls prevention discussed    FALL RISK PREVENTION PERTAINING TO THE HOME:  Any stairs in or around the home? No  If so, are there any without handrails? No  Home free of loose throw rugs in walkways, pet beds, electrical cords, etc? Yes  Adequate lighting in your home to reduce  risk of falls? Yes   ASSISTIVE DEVICES UTILIZED TO PREVENT FALLS:  Life alert? No  Use of a cane, walker or w/c? No  Grab bars in the bathroom? No  Shower chair or bench in shower? No  Elevated toilet seat or a handicapped toilet? No          09/11/2022    3:41 PM 09/05/2021    3:09 PM  6CIT Screen  What Year? 0 points 0 points  What month? 0 points 0 points  What time? 0 points 0 points  Count back from 20 0 points 0 points  Months in  reverse 0 points 2 points  Repeat phrase 0 points 0 points  Total Score 0 points 2 points    Immunizations Immunization History  Administered Date(s) Administered   Moderna Sars-Covid-2 Vaccination 08/25/2019, 09/22/2019   PFIZER Comirnaty(Gray Top)Covid-19 Tri-Sucrose Vaccine 03/02/2020   Tdap 06/10/2007, 04/03/2014   Zoster Recombinat (Shingrix) 07/08/2020, 09/16/2020    TDAP status: Up to date  Flu Vaccine status: Declined, Education has been provided regarding the importance of this vaccine but patient still declined. Advised may receive this vaccine at local pharmacy or Health Dept. Aware to provide a copy of the vaccination record if obtained from local pharmacy or Health Dept. Verbalized acceptance and understanding.  Pneumococcal vaccine status: Due, Education has been provided regarding the importance of this vaccine. Advised may receive this vaccine at local pharmacy or Health Dept. Aware to provide a copy of the vaccination record if obtained from local pharmacy or Health Dept. Verbalized acceptance and understanding.  Covid-19 vaccine status: Completed vaccines  Qualifies for Shingles Vaccine? Yes   Zostavax completed Yes   Shingrix Completed?: Yes  Screening Tests Health Maintenance  Topic Date Due   PAP SMEAR-Modifier  11/06/2020   COVID-19 Vaccine (4 - 2023-24 season) 01/27/2022   Hepatitis C Screening  12/30/2022 (Originally 06/03/1978)   MAMMOGRAM  12/01/2022   INFLUENZA VACCINE  12/28/2022   Lung Cancer Screening  07/21/2023   Medicare Annual Wellness (AWV)  09/11/2023   DTaP/Tdap/Td (3 - Td or Tdap) 04/03/2024   COLONOSCOPY (Pts 45-27yrs Insurance coverage will need to be confirmed)  01/06/2025   HIV Screening  Completed   Zoster Vaccines- Shingrix  Completed   HPV VACCINES  Aged Out    Health Maintenance  Health Maintenance Due  Topic Date Due   PAP SMEAR-Modifier  11/06/2020   COVID-19 Vaccine (4 - 2023-24 season) 01/27/2022    Colorectal cancer  screening: Type of screening: Colonoscopy. Completed 01/07/2015. Repeat every 10 years  Mammogram status: Completed 11/30/2021. Repeat every year  Bone Density status: Ordered not of age . Pt provided with contact info and advised to call to schedule appt.  Lung Cancer Screening: (Low Dose CT Chest recommended if Age 58-80 years, 30 pack-year currently smoking OR have quit w/in 15years.) does qualify.   Lung Cancer Screening Referral: 07/21/2022  Additional Screening:  Hepatitis C Screening: does not qualify;   Vision Screening: Recommended annual ophthalmology exams for early detection of glaucoma and other disorders of the eye. Is the patient up to date with their annual eye exam?  Yes  Who is the provider or what is the name of the office in which the patient attends annual eye exams? [facility]  If pt is not established with a provider, would they like to be referred to a provider to establish care? No .   Dental Screening: Recommended annual dental exams for proper oral hygiene  [facility]  Referral / Chronic Care Management: CRR required this visit?  No   CCM required this visit?  No      Plan:     I have personally reviewed and noted the following in the patient's chart:   Medical and social history Use of alcohol, tobacco or illicit drugs  Current medications and supplements including opioid prescriptions. Patient is not currently taking opioid prescriptions. Functional ability and status Nutritional status Physical activity Advanced directives List of other physicians Hospitalizations, surgeries, and ER visits in previous 12 months Vitals Screenings to include cognitive, depression, and falls Referrals and appointments  In addition, I have reviewed and discussed with patient certain preventive protocols, quality metrics, and best practice recommendations. A written personalized care plan for preventive services as well as general preventive health  recommendations were provided to patient.     Lorrene Reid, LPN   1/61/0960   Nurse Notes: Due pneumonia vaccine

## 2022-09-11 NOTE — Patient Instructions (Signed)
Sarah Chapman , Thank you for taking time to come for your Medicare Wellness Visit. I appreciate your ongoing commitment to your health goals. Please review the following plan we discussed and let me know if I can assist you in the future.   These are the goals we discussed:  Goals      DIET - INCREASE WATER INTAKE     Weight (lb) < 200 lb (90.7 kg)     Pt states she would like to lose 50# in the next year.         This is a list of the screening recommended for you and due dates:  Health Maintenance  Topic Date Due   Pap Smear  11/06/2020   COVID-19 Vaccine (4 - 2023-24 season) 01/27/2022   Hepatitis C Screening: USPSTF Recommendation to screen - Ages 18-79 yo.  12/30/2022*   Mammogram  12/01/2022   Flu Shot  12/28/2022   Screening for Lung Cancer  07/21/2023   Medicare Annual Wellness Visit  09/11/2023   DTaP/Tdap/Td vaccine (3 - Td or Tdap) 04/03/2024   Colon Cancer Screening  01/06/2025   HIV Screening  Completed   Zoster (Shingles) Vaccine  Completed   HPV Vaccine  Aged Out  *Topic was postponed. The date shown is not the original due date.    Advanced directives: Advance directive discussed with you today. I have provided a copy for you to complete at home and have notarized. Once this is complete please bring a copy in to our office so we can scan it into your chart.   Conditions/risks identified: Aim for 30 minutes of exercise or brisk walking, 6-8 glasses of water, and 5 servings of fruits and vegetables each day.   Next appointment: Follow up in one year for your annual wellness visit.   Preventive Care 40-64 Years, Female Preventive care refers to lifestyle choices and visits with your health care provider that can promote health and wellness. What does preventive care include? A yearly physical exam. This is also called an annual well check. Dental exams once or twice a year. Routine eye exams. Ask your health care provider how often you should have your eyes  checked. Personal lifestyle choices, including: Daily care of your teeth and gums. Regular physical activity. Eating a healthy diet. Avoiding tobacco and drug use. Limiting alcohol use. Practicing safe sex. Taking low-dose aspirin daily starting at age 60. Taking vitamin and mineral supplements as recommended by your health care provider. What happens during an annual well check? The services and screenings done by your health care provider during your annual well check will depend on your age, overall health, lifestyle risk factors, and family history of disease. Counseling  Your health care provider may ask you questions about your: Alcohol use. Tobacco use. Drug use. Emotional well-being. Home and relationship well-being. Sexual activity. Eating habits. Work and work Astronomer. Method of birth control. Menstrual cycle. Pregnancy history. Screening  You may have the following tests or measurements: Height, weight, and BMI. Blood pressure. Lipid and cholesterol levels. These may be checked every 5 years, or more frequently if you are over 10 years old. Skin check. Lung cancer screening. You may have this screening every year starting at age 52 if you have a 30-pack-year history of smoking and currently smoke or have quit within the past 15 years. Fecal occult blood test (FOBT) of the stool. You may have this test every year starting at age 52. Flexible sigmoidoscopy or colonoscopy. You may  have a sigmoidoscopy every 5 years or a colonoscopy every 10 years starting at age 62. Hepatitis C blood test. Hepatitis B blood test. Sexually transmitted disease (STD) testing. Diabetes screening. This is done by checking your blood sugar (glucose) after you have not eaten for a while (fasting). You may have this done every 1-3 years. Mammogram. This may be done every 1-2 years. Talk to your health care provider about when you should start having regular mammograms. This may depend on  whether you have a family history of breast cancer. BRCA-related cancer screening. This may be done if you have a family history of breast, ovarian, tubal, or peritoneal cancers. Pelvic exam and Pap test. This may be done every 3 years starting at age 54. Starting at age 9, this may be done every 5 years if you have a Pap test in combination with an HPV test. Bone density scan. This is done to screen for osteoporosis. You may have this scan if you are at high risk for osteoporosis. Discuss your test results, treatment options, and if necessary, the need for more tests with your health care provider. Vaccines  Your health care provider may recommend certain vaccines, such as: Influenza vaccine. This is recommended every year. Tetanus, diphtheria, and acellular pertussis (Tdap, Td) vaccine. You may need a Td booster every 10 years. Zoster vaccine. You may need this after age 3. Pneumococcal 13-valent conjugate (PCV13) vaccine. You may need this if you have certain conditions and were not previously vaccinated. Pneumococcal polysaccharide (PPSV23) vaccine. You may need one or two doses if you smoke cigarettes or if you have certain conditions. Talk to your health care provider about which screenings and vaccines you need and how often you need them. This information is not intended to replace advice given to you by your health care provider. Make sure you discuss any questions you have with your health care provider. Document Released: 06/11/2015 Document Revised: 02/02/2016 Document Reviewed: 03/16/2015 Elsevier Interactive Patient Education  2017 [employer].    Fall Prevention in the Home Falls can cause injuries. They can happen to people of all ages. There are many things you can do to make your home safe and to help prevent falls. What can I do on the outside of my home? Regularly fix the edges of walkways and driveways and fix any cracks. Remove anything that might make you trip as you  walk through a door, such as a raised step or threshold. Trim any bushes or trees on the path to your home. Use bright outdoor lighting. Clear any walking paths of anything that might make someone trip, such as rocks or tools. Regularly check to see if handrails are loose or broken. Make sure that both sides of any steps have handrails. Any raised decks and porches should have guardrails on the edges. Have any leaves, snow, or ice cleared regularly. Use sand or salt on walking paths during winter. Clean up any spills in your garage right away. This includes oil or grease spills. What can I do in the bathroom? Use night lights. Install grab bars by the toilet and in the tub and shower. Do not use towel bars as grab bars. Use non-skid mats or decals in the tub or shower. If you need to sit down in the shower, use a plastic, non-slip stool. Keep the floor dry. Clean up any water that spills on the floor as soon as it happens. Remove soap buildup in the tub or shower regularly. Attach  bath mats securely with double-sided non-slip rug tape. Do not have throw rugs and other things on the floor that can make you trip. What can I do in the bedroom? Use night lights. Make sure that you have a light by your bed that is easy to reach. Do not use any sheets or blankets that are too big for your bed. They should not hang down onto the floor. Have a firm chair that has side arms. You can use this for support while you get dressed. Do not have throw rugs and other things on the floor that can make you trip. What can I do in the kitchen? Clean up any spills right away. Avoid walking on wet floors. Keep items that you use a lot in easy-to-reach places. If you need to reach something above you, use a strong step stool that has a grab bar. Keep electrical cords out of the way. Do not use floor polish or wax that makes floors slippery. If you must use wax, use non-skid floor wax. Do not have throw rugs  and other things on the floor that can make you trip. What can I do with my stairs? Do not leave any items on the stairs. Make sure that there are handrails on both sides of the stairs and use them. Fix handrails that are broken or loose. Make sure that handrails are as long as the stairways. Check any carpeting to make sure that it is firmly attached to the stairs. Fix any carpet that is loose or worn. Avoid having throw rugs at the top or bottom of the stairs. If you do have throw rugs, attach them to the floor with carpet tape. Make sure that you have a light switch at the top of the stairs and the bottom of the stairs. If you do not have them, ask someone to add them for you. What else can I do to help prevent falls? Wear shoes that: Do not have high heels. Have rubber bottoms. Are comfortable and fit you well. Are closed at the toe. Do not wear sandals. If you use a stepladder: Make sure that it is fully opened. Do not climb a closed stepladder. Make sure that both sides of the stepladder are locked into place. Ask someone to hold it for you, if possible. Clearly mark and make sure that you can see: Any grab bars or handrails. First and last steps. Where the edge of each step is. Use tools that help you move around (mobility aids) if they are needed. These include: Canes. Walkers. Scooters. Crutches. Turn on the lights when you go into a dark area. Replace any light bulbs as soon as they burn out. Set up your furniture so you have a clear path. Avoid moving your furniture around. If any of your floors are uneven, fix them. If there are any pets around you, be aware of where they are. Review your medicines with your doctor. Some medicines can make you feel dizzy. This can increase your chance of falling. Ask your doctor what other things that you can do to help prevent falls. This information is not intended to replace advice given to you by your health care provider. Make sure  you discuss any questions you have with your health care provider. Document Released: 03/11/2009 Document Revised: 10/21/2015 Document Reviewed: 06/19/2014 Elsevier Interactive Patient Education  2017 [employer].

## 2022-09-14 LAB — HM MAMMOGRAPHY

## 2022-09-27 ENCOUNTER — Encounter: Payer: Self-pay | Admitting: Family Medicine

## 2022-09-27 ENCOUNTER — Telehealth (INDEPENDENT_AMBULATORY_CARE_PROVIDER_SITE_OTHER): Payer: Medicare HMO | Admitting: Family Medicine

## 2022-09-27 DIAGNOSIS — L237 Allergic contact dermatitis due to plants, except food: Secondary | ICD-10-CM | POA: Diagnosis not present

## 2022-09-27 MED ORDER — PREDNISONE 10 MG (21) PO TBPK: ORAL_TABLET | ORAL | 0 refills | Status: DC

## 2022-09-27 NOTE — Progress Notes (Addendum)
Virtual Visit via Video   I connected with patient on 09/27/22 at 1258 by a video enabled telemedicine application and verified that I am speaking with the correct person using two identifiers.  Location patient: Home Location provider: [facility] Persons participating in the virtual visit: Patient and Provider  I discussed the limitations of evaluation and management by telemedicine and the availability of in person appointments. The patient expressed understanding and agreed to proceed.  Subjective:   HPI:  Pt presents today for  Chief Complaint  Patient presents with   Rash   Rash: Patient complains of rash involving the bilateral arm and face. Rash started 2 days ago. Appearance of rash at onset: Color of lesion(s): red, Texture of lesion(s):  raised and vesicular , Size of lesion(s): small, linear pattern. Rash has changed over time Initial distribution:  spreading .  Discomfort associated with rash: is pruritic.  Associated symptoms: none. Denies: abdominal pain, arthralgia, congestion, cough, crankiness, decrease in appetite, decrease in energy level, fever, headache, irritability, myalgia, nausea, sore throat, and vomiting. She was working in the yard and got exposed to poison ivy. Patient has identified precipitant. She has not used anything for the symptoms. States it is on her face but not in her eye. No visual changes. No throat swelling or shortness of breath. Has had this in the past and prednisone has worked well for her.    Review of Systems  Eyes:  Negative for blurred vision, double vision, photophobia, pain, discharge and redness.  Respiratory:  Negative for cough, hemoptysis, sputum production, shortness of breath, wheezing and stridor.   Cardiovascular:  Negative for chest pain, palpitations, orthopnea, claudication, leg swelling and PND.  Gastrointestinal:  Negative for abdominal pain, diarrhea, nausea and vomiting.  Skin:  Positive  for itching and rash.  Neurological:  Negative for dizziness and headaches.  All other systems reviewed and are negative.    Patient Active Problem List   Diagnosis Date Noted   Aortic atherosclerosis (HCC) 07/21/2022   Hyperlipidemia 03/15/2022   Cervical spondylosis without myelopathy 06/07/2021   Complication of surgical procedure 06/07/2021   Fibromyalgia 06/07/2021   Allergic contact dermatitis 12/01/2020   Nail fungus 11/02/2020   Essential hypertension 07/06/2020   Centrilobular emphysema (HCC) 08/25/2019   Former smoker 08/25/2019   OSA (obstructive sleep apnea) 08/25/2019   Bilateral carotid artery stenosis 06/09/2019   Decreased libido 11/06/2017   Drug-induced constipation 03/26/2017   Gastroesophageal reflux disease without esophagitis 03/26/2017   Right lateral abdominal pain 01/22/2017    Social History   Tobacco Use   Smoking status: Former    Packs/day: 1.00    Years: 33.00    Additional pack years: 0.00    Total pack years: 33.00    Types: Cigarettes    Quit date: 07/25/2016    Years since quitting: 6.1   Smokeless tobacco: Never  Substance Use Topics   Alcohol use: No    Current Outpatient Medications:    predniSONE (STERAPRED UNI-PAK 21 TAB) 10 MG (21) TBPK tablet, As directed x 6 days, Disp: 21 tablet, Rfl: 0   REPATHA SURECLICK 140 MG/ML SOAJ, Inject 1 mL into the skin every 14 (fourteen) days., Disp: , Rfl:    albuterol (VENTOLIN HFA) 108 (90 Base) MCG/ACT inhaler, INHALE 2 PUFFS EVERY 6 HOURS AS NEEDED FOR WHEEZING OR SHORTNESS OF BREATH, Disp: 18 g, Rfl: 5   Alirocumab (PRALUENT) 150 MG/ML SOAJ, Inject 1 mL into the skin every 14 (fourteen)  days., Disp: 2 mL, Rfl: 11   ALPRAZolam (XANAX) 0.5 MG tablet, Take 0.25-0.5 mg by mouth daily as needed., Disp: , Rfl:    Armodafinil 250 MG tablet, Take 250 mg by mouth daily., Disp: , Rfl:    aspirin 81 MG EC tablet, Take 1 tablet by mouth daily., Disp: , Rfl:    atorvastatin (LIPITOR) 80 MG tablet, Take 1  tablet (80 mg total) by mouth daily., Disp: 90 tablet, Rfl: 3   BOTOX 200 units SOLR, Every 3 months, Disp: , Rfl:    buPROPion HCl ER, XL, 450 MG TB24, Take 450 mg by mouth daily., Disp: , Rfl:    cefPROZIL (CEFZIL) 500 MG tablet, Take 1 tablet (500 mg total) by mouth 2 (two) times daily. For infection. Take all of this medication., Disp: 20 tablet, Rfl: 0   dexlansoprazole (DEXILANT) 60 MG capsule, Take 1 capsule (60 mg total) by mouth daily., Disp: 90 capsule, Rfl: 3   diphenoxylate-atropine (LOMOTIL) 2.5-0.025 MG tablet, Take 2 tablets by mouth 4 (four) times daily as needed for diarrhea or loose stools., Disp: 30 tablet, Rfl: 0   ezetimibe (ZETIA) 10 MG tablet, TAKE 1 TABLET DAILY, Disp: 30 tablet, Rfl: 3   Fluticasone-Umeclidin-Vilant (TRELEGY ELLIPTA) 200-62.5-25 MCG/ACT AEPB, INHALE 1 PUFF ONCE A DAY, Disp: 60 each, Rfl: 2   HYDROcodone-acetaminophen (NORCO) 10-325 MG tablet, Take 1 tablet by mouth 4 (four) times daily as needed., Disp: , Rfl:    ipratropium-albuterol (DUONEB) 0.5-2.5 (3) MG/3ML SOLN, Take 3 mLs by nebulization every 6 (six) hours as needed., Disp: 360 mL, Rfl: 3   Loratadine 10 MG CAPS, 1 Once A Day Oral, Disp: , Rfl:    moxifloxacin (AVELOX) 400 MG tablet, Take 1 tablet (400 mg total) by mouth daily., Disp: 10 tablet, Rfl: 0   naloxone (NARCAN) nasal spray 4 mg/0.1 mL, , Disp: , Rfl:    Nebulizers (HOMENEB WITH SIDESTREAM) MISC, See admin instructions., Disp: , Rfl:    nortriptyline (PAMELOR) 75 MG capsule, Take 75 mg by mouth at bedtime., Disp: , Rfl:    olmesartan (BENICAR) 40 MG tablet, Take 1 tablet (40 mg total) by mouth daily., Disp: 100 tablet, Rfl: 1   ondansetron (ZOFRAN-ODT) 8 MG disintegrating tablet, Take 1 tablet (8 mg total) by mouth every 6 (six) hours as needed for nausea or vomiting., Disp: 20 tablet, Rfl: 1   Pseudoephedrine-Guaifenesin [phone] MG TB12, Take 1 tablet by mouth 2 (two) times daily. For congestion, Disp: 20 tablet, Rfl: 0   tiZANidine  (ZANAFLEX) 4 MG tablet, Take by mouth., Disp: , Rfl:    Ubrogepant (UBRELVY PO), Take by mouth., Disp: , Rfl:   Allergies  Allergen Reactions   Clindamycin/Lincomycin Rash   Penicillins     Unknown     Objective:   There were no vitals taken for this visit.  Patient is well-developed, well-nourished in no acute distress.  Resting comfortably at home.  Head is normocephalic, atraumatic.  No labored breathing.  Speech is clear and coherent with logical content.  Patient is alert and oriented at baseline.  Red, raised linear rash to forearms and cheeks noted.   Assessment and Plan:   Tiffane was seen today for rash.  Diagnoses and all orders for this visit:  Poison ivy dermatitis Classic poison ivy pattern. Will treat with below. Aware of symptomatic care. Report new, worsening, or persistent symptoms.  -     predniSONE (STERAPRED UNI-PAK 21 TAB) 10 MG (21) TBPK tablet; As directed x  6 days      Return if symptoms worsen or fail to improve.  Kari Baars, FNP-C [facility] [address] [place], [place] [address] [phone]  09/27/2022  Time spent with the patient: 12 minutes, of which >50% was spent in obtaining information about symptoms, reviewing previous labs, evaluations, and treatments, counseling about condition (please see the discussed topics above), and developing a plan to further investigate it; had a number of questions which I addressed.

## 2022-09-27 NOTE — Addendum Note (Signed)
Addended by: Sonny Masters on: 09/27/2022 01:10 PM   Modules accepted: Orders

## 2022-10-02 DIAGNOSIS — M546 Pain in thoracic spine: Secondary | ICD-10-CM | POA: Diagnosis not present

## 2022-10-02 DIAGNOSIS — M5134 Other intervertebral disc degeneration, thoracic region: Secondary | ICD-10-CM | POA: Diagnosis not present

## 2022-10-02 DIAGNOSIS — Z79899 Other long term (current) drug therapy: Secondary | ICD-10-CM | POA: Diagnosis not present

## 2022-10-02 DIAGNOSIS — M961 Postlaminectomy syndrome, not elsewhere classified: Secondary | ICD-10-CM | POA: Diagnosis not present

## 2022-10-02 DIAGNOSIS — M47812 Spondylosis without myelopathy or radiculopathy, cervical region: Secondary | ICD-10-CM | POA: Diagnosis not present

## 2022-10-02 DIAGNOSIS — M47814 Spondylosis without myelopathy or radiculopathy, thoracic region: Secondary | ICD-10-CM | POA: Diagnosis not present

## 2022-10-02 DIAGNOSIS — M25569 Pain in unspecified knee: Secondary | ICD-10-CM | POA: Diagnosis not present

## 2022-10-02 DIAGNOSIS — G894 Chronic pain syndrome: Secondary | ICD-10-CM | POA: Diagnosis not present

## 2022-10-02 DIAGNOSIS — M797 Fibromyalgia: Secondary | ICD-10-CM | POA: Diagnosis not present

## 2022-10-02 DIAGNOSIS — M5136 Other intervertebral disc degeneration, lumbar region: Secondary | ICD-10-CM | POA: Diagnosis not present

## 2022-10-02 DIAGNOSIS — Z79891 Long term (current) use of opiate analgesic: Secondary | ICD-10-CM | POA: Diagnosis not present

## 2022-10-02 DIAGNOSIS — M25529 Pain in unspecified elbow: Secondary | ICD-10-CM | POA: Diagnosis not present

## 2022-10-31 DIAGNOSIS — M5136 Other intervertebral disc degeneration, lumbar region: Secondary | ICD-10-CM | POA: Diagnosis not present

## 2022-10-31 DIAGNOSIS — M5459 Other low back pain: Secondary | ICD-10-CM | POA: Diagnosis not present

## 2022-10-31 DIAGNOSIS — M961 Postlaminectomy syndrome, not elsewhere classified: Secondary | ICD-10-CM | POA: Diagnosis not present

## 2022-10-31 DIAGNOSIS — M5134 Other intervertebral disc degeneration, thoracic region: Secondary | ICD-10-CM | POA: Diagnosis not present

## 2022-10-31 DIAGNOSIS — M25569 Pain in unspecified knee: Secondary | ICD-10-CM | POA: Diagnosis not present

## 2022-10-31 DIAGNOSIS — M47812 Spondylosis without myelopathy or radiculopathy, cervical region: Secondary | ICD-10-CM | POA: Diagnosis not present

## 2022-10-31 DIAGNOSIS — M797 Fibromyalgia: Secondary | ICD-10-CM | POA: Diagnosis not present

## 2022-10-31 DIAGNOSIS — Z79891 Long term (current) use of opiate analgesic: Secondary | ICD-10-CM | POA: Diagnosis not present

## 2022-10-31 DIAGNOSIS — M47814 Spondylosis without myelopathy or radiculopathy, thoracic region: Secondary | ICD-10-CM | POA: Diagnosis not present

## 2022-10-31 DIAGNOSIS — M25529 Pain in unspecified elbow: Secondary | ICD-10-CM | POA: Diagnosis not present

## 2022-10-31 DIAGNOSIS — M546 Pain in thoracic spine: Secondary | ICD-10-CM | POA: Diagnosis not present

## 2022-10-31 DIAGNOSIS — G894 Chronic pain syndrome: Secondary | ICD-10-CM | POA: Diagnosis not present

## 2022-11-22 DIAGNOSIS — G8921 Chronic pain due to trauma: Secondary | ICD-10-CM | POA: Diagnosis not present

## 2022-11-22 DIAGNOSIS — F339 Major depressive disorder, recurrent, unspecified: Secondary | ICD-10-CM | POA: Diagnosis not present

## 2022-11-22 DIAGNOSIS — F411 Generalized anxiety disorder: Secondary | ICD-10-CM | POA: Diagnosis not present

## 2022-11-22 DIAGNOSIS — F900 Attention-deficit hyperactivity disorder, predominantly inattentive type: Secondary | ICD-10-CM | POA: Diagnosis not present

## 2022-12-13 DIAGNOSIS — M47814 Spondylosis without myelopathy or radiculopathy, thoracic region: Secondary | ICD-10-CM | POA: Diagnosis not present

## 2022-12-13 DIAGNOSIS — M961 Postlaminectomy syndrome, not elsewhere classified: Secondary | ICD-10-CM | POA: Diagnosis not present

## 2022-12-13 DIAGNOSIS — M5134 Other intervertebral disc degeneration, thoracic region: Secondary | ICD-10-CM | POA: Diagnosis not present

## 2022-12-13 DIAGNOSIS — G894 Chronic pain syndrome: Secondary | ICD-10-CM | POA: Diagnosis not present

## 2022-12-13 DIAGNOSIS — M47812 Spondylosis without myelopathy or radiculopathy, cervical region: Secondary | ICD-10-CM | POA: Diagnosis not present

## 2022-12-13 DIAGNOSIS — M797 Fibromyalgia: Secondary | ICD-10-CM | POA: Diagnosis not present

## 2022-12-13 DIAGNOSIS — M5136 Other intervertebral disc degeneration, lumbar region: Secondary | ICD-10-CM | POA: Diagnosis not present

## 2022-12-13 DIAGNOSIS — M25529 Pain in unspecified elbow: Secondary | ICD-10-CM | POA: Diagnosis not present

## 2022-12-13 DIAGNOSIS — M25569 Pain in unspecified knee: Secondary | ICD-10-CM | POA: Diagnosis not present

## 2022-12-13 DIAGNOSIS — Z79891 Long term (current) use of opiate analgesic: Secondary | ICD-10-CM | POA: Diagnosis not present

## 2022-12-19 ENCOUNTER — Other Ambulatory Visit: Payer: Self-pay | Admitting: Cardiology

## 2022-12-22 ENCOUNTER — Other Ambulatory Visit: Payer: Self-pay | Admitting: Family Medicine

## 2023-01-02 ENCOUNTER — Ambulatory Visit (INDEPENDENT_AMBULATORY_CARE_PROVIDER_SITE_OTHER): Payer: Medicare HMO | Admitting: Family Medicine

## 2023-01-02 ENCOUNTER — Encounter: Payer: Self-pay | Admitting: Family Medicine

## 2023-01-02 VITALS — BP 134/69 | HR 88 | Temp 97.9°F | Ht 65.0 in | Wt 179.0 lb

## 2023-01-02 DIAGNOSIS — E559 Vitamin D deficiency, unspecified: Secondary | ICD-10-CM

## 2023-01-02 DIAGNOSIS — Z1231 Encounter for screening mammogram for malignant neoplasm of breast: Secondary | ICD-10-CM | POA: Diagnosis not present

## 2023-01-02 DIAGNOSIS — J432 Centrilobular emphysema: Secondary | ICD-10-CM | POA: Diagnosis not present

## 2023-01-02 DIAGNOSIS — I700 Atherosclerosis of aorta: Secondary | ICD-10-CM | POA: Diagnosis not present

## 2023-01-02 DIAGNOSIS — E785 Hyperlipidemia, unspecified: Secondary | ICD-10-CM | POA: Diagnosis not present

## 2023-01-02 DIAGNOSIS — I10 Essential (primary) hypertension: Secondary | ICD-10-CM | POA: Diagnosis not present

## 2023-01-02 DIAGNOSIS — R799 Abnormal finding of blood chemistry, unspecified: Secondary | ICD-10-CM | POA: Diagnosis not present

## 2023-01-02 LAB — URINALYSIS
Bilirubin, UA: NEGATIVE
Glucose, UA: NEGATIVE
Ketones, UA: NEGATIVE
Nitrite, UA: NEGATIVE
Protein,UA: NEGATIVE
RBC, UA: NEGATIVE
Specific Gravity, UA: 1.01 (ref 1.005–1.030)
Urobilinogen, Ur: 0.2 mg/dL (ref 0.2–1.0)
pH, UA: 6 (ref 5.0–7.5)

## 2023-01-02 MED ORDER — ALBUTEROL SULFATE HFA 108 (90 BASE) MCG/ACT IN AERS: 2.0000 | INHALATION_SPRAY | RESPIRATORY_TRACT | 11 refills | Status: AC | PRN

## 2023-01-02 MED ORDER — HYDROCODONE-ACETAMINOPHEN 10-325 MG PO TABS: 1.0000 | ORAL_TABLET | Freq: Three times a day (TID) | ORAL | Status: AC | PRN

## 2023-01-02 MED ORDER — PREDNISONE 10 MG PO TABS: ORAL_TABLET | ORAL | 0 refills | Status: DC

## 2023-01-02 MED ORDER — IPRATROPIUM-ALBUTEROL 0.5-2.5 (3) MG/3ML IN SOLN: 3.0000 mL | Freq: Four times a day (QID) | RESPIRATORY_TRACT | 3 refills | Status: DC | PRN

## 2023-01-02 MED ORDER — TIZANIDINE HCL 4 MG PO TABS: 2.0000 mg | ORAL_TABLET | Freq: Every day | ORAL | 5 refills | Status: DC

## 2023-01-02 NOTE — Patient Instructions (Signed)
Try remifemin (OTC) for hot flashes

## 2023-01-02 NOTE — Progress Notes (Signed)
Subjective:  Patient ID: Sarah Chapman, female    DOB: [birth year]  Age: 62 y.o. MRN: [id number]  CC: Medical Management of Chronic Issues   HPI Sarah Chapman presents for  in for follow-up of elevated cholesterol. Doing well without complaints on current medication. Denies side effects of statin including myalgia and arthralgia and nausea. Currently no chest pain, shortness of breath or other cardiovascular related symptoms noted. Also taking praluent and zetia per cardiology.    presents for  follow-up of hypertension. Patient has no history of headache chest pain or shortness of breath or recent cough. Patient also denies symptoms of TIA such as focal numbness or weakness. Patient denies side effects from medication. States taking it regularly.       01/02/2023    2:05 PM 09/11/2022    3:39 PM 08/23/2022    4:54 PM  Depression screen PHQ 2/9  Decreased Interest 2 0 3  Down, Depressed, Hopeless 1 0 3  PHQ - 2 Score 3 0 6  Altered sleeping 1 0 2  Tired, decreased energy 0 0 3  Change in appetite 2 0 3  Feeling bad or failure about yourself  1 0 2  Trouble concentrating 1 0 3  Moving slowly or fidgety/restless 0 0 2  Suicidal thoughts 0 0 0  PHQ-9 Score 8 0 21  Difficult doing work/chores Somewhat difficult Not difficult at all Extremely dIfficult    History Sarah Chapman has a past medical history of Allergic rhinitis, Back pain, Chronic headache, COPD (chronic obstructive pulmonary disease) (HCC), Emphysema of lung (HCC), Hyperlipidemia, Hypertension, Neck pain, Sleep apnea, and Vaginal Pap smear, abnormal.   Sarah Chapman has a past surgical history that includes Neck surgery; Tonsillectomy; Cesarean section; Gynecologic cryosurgery; and Colonoscopy (N/A, 01/07/2015).   Sarah Chapman family history includes Bone cancer in Sarah Chapman mother; Cancer in Sarah Chapman maternal grandmother; Heart attack in Sarah Chapman brother; Mental illness in Sarah Chapman son.Sarah Chapman reports that Sarah Chapman quit smoking about 6 years ago. Sarah Chapman smoking use included cigarettes.  Sarah Chapman started smoking about 39 years ago. Sarah Chapman has a 33 pack-year smoking history. Sarah Chapman has never used smokeless tobacco. Sarah Chapman reports that Sarah Chapman does not drink alcohol and does not use drugs.    ROS Review of Systems  Constitutional: Negative.   HENT: Negative.    Eyes:  Negative for visual disturbance.  Respiratory:  Positive for shortness of breath (chronic with exertion sucha s walking).   Cardiovascular:  Negative for chest pain.  Gastrointestinal:  Negative for abdominal pain.  Musculoskeletal:  Positive for arthralgias (right elbow pain from squeezing trigger of Round up).    Objective:  BP 134/69   Pulse 88   Temp 97.9 F (36.6 C)   Ht 5\' 5"  (1.651 m)   Wt 179 lb (81.2 kg)   SpO2 93%   BMI 29.79 kg/m   BP Readings from Last 3 Encounters:  01/02/23 134/69  08/23/22 133/83  07/03/22 106/66    Wt Readings from Last 3 Encounters:  01/02/23 179 lb (81.2 kg)  09/11/22 178 lb (80.7 kg)  08/23/22 176 lb 6.4 oz (80 kg)     Physical Exam Constitutional:      General: Sarah Chapman is not in acute distress.    Appearance: Sarah Chapman is well-developed.  Cardiovascular:     Rate and Rhythm: Normal rate and regular rhythm.  Pulmonary:     Breath sounds: Normal breath sounds.  Musculoskeletal:        General: Tenderness (right elbow extensor) present. Normal range  of motion.  Skin:    General: Skin is warm and dry.  Neurological:     Mental Status: Sarah Chapman is alert and oriented to person, place, and time.       Assessment & Plan:   [name] was seen today for medical management of chronic issues.  Diagnoses and all orders for this visit:  Centrilobular emphysema (HCC)  Encounter for screening mammogram for malignant neoplasm of breast -     MM 3D SCREENING MAMMOGRAM BILATERAL BREAST; Future  Hyperlipidemia, unspecified hyperlipidemia type -     Lipid panel  Essential hypertension -     CBC with Differential/Platelet -     CMP14+EGFR -     Urinalysis  Aortic atherosclerosis  (HCC)  Vitamin D deficiency -     VITAMIN D 25 Hydroxy (Vit-D Deficiency, Fractures)  Other orders -     albuterol (VENTOLIN HFA) 108 (90 Base) MCG/ACT inhaler; Inhale 2 puffs into the lungs every 4 (four) hours as needed for wheezing or shortness of breath. -     tiZANidine (ZANAFLEX) 4 MG tablet; Take 0.5-1 tablets (2-4 mg total) by mouth at bedtime. -     HYDROcodone-acetaminophen (NORCO) 10-325 MG tablet; Take 1 tablet by mouth 3 (three) times daily as needed. -     ipratropium-albuterol (DUONEB) 0.5-2.5 (3) MG/3ML SOLN; Take 3 mLs by nebulization every 6 (six) hours as needed. -     predniSONE (DELTASONE) 10 MG tablet; Take 5 daily for 2 days followed by 4,3,2 and 1 for 2 days each.       I have discontinued Sheryle Spray. Gable's Ubrogepant (UBRELVY PO), Botox, cefPROZIL, ondansetron, moxifloxacin, Pseudoephedrine-Guaifenesin, and predniSONE. I have also changed Sarah Chapman albuterol, tiZANidine, and HYDROcodone-acetaminophen. Additionally, I am having Sarah Chapman start on predniSONE. Lastly, I am having Sarah Chapman maintain Sarah Chapman aspirin EC, buPROPion HCl ER (XL), nortriptyline, Armodafinil, Fluticasone-Umeclidin-Vilant, HomeNeb with Sidestream, Loratadine, naloxone, ALPRAZolam, dexlansoprazole, Praluent, atorvastatin, diphenoxylate-atropine, olmesartan, Repatha SureClick, ezetimibe, and ipratropium-albuterol.  Allergies as of 01/02/2023       Reactions   Clindamycin/lincomycin Rash   Penicillins    Unknown        Medication List        Accurate as of January 02, 2023  2:40 PM. If you have any questions, ask your nurse or doctor.          STOP taking these medications    Botox 200 units injection Generic drug: botulinum toxin Type A Stopped by: Marquarius Lofton   cefPROZIL 500 MG tablet Commonly known as: CEFZIL Stopped by: Dorotea Hand   moxifloxacin 400 MG tablet Commonly known as: AVELOX Stopped by: Ismael Karge   ondansetron 8 MG disintegrating tablet Commonly known as: ZOFRAN-ODT Stopped  by: Talea Manges   predniSONE 10 MG (21) Tbpk tablet Commonly known as: STERAPRED UNI-PAK 21 TAB Replaced by: predniSONE 10 MG tablet Stopped by: Marua Qin   Pseudoephedrine-Guaifenesin [phone] MG Tb12 Stopped by: Doraine Schexnider   UBRELVY PO Stopped by: Jamielynn Wigley       TAKE these medications    albuterol 108 (90 Base) MCG/ACT inhaler Commonly known as: VENTOLIN HFA Inhale 2 puffs into the lungs every 4 (four) hours as needed for wheezing or shortness of breath. What changed: See the new instructions. Changed by: Mekhai Venuto   ALPRAZolam 0.5 MG tablet Commonly known as: XANAX Take 0.25-0.5 mg by mouth daily as needed.   Armodafinil 250 MG tablet Take 250 mg by mouth daily.   aspirin EC 81 MG tablet Take 1  tablet by mouth daily.   atorvastatin 80 MG tablet Commonly known as: LIPITOR Take 1 tablet (80 mg total) by mouth daily.   buPROPion HCl ER (XL) 450 MG Tb24 Take 450 mg by mouth daily.   dexlansoprazole 60 MG capsule Commonly known as: Dexilant Take 1 capsule (60 mg total) by mouth daily.   diphenoxylate-atropine 2.5-0.025 MG tablet Commonly known as: Lomotil Take 2 tablets by mouth 4 (four) times daily as needed for diarrhea or loose stools.   ezetimibe 10 MG tablet Commonly known as: ZETIA TAKE ONE TABLET DAILY   Fluticasone-Umeclidin-Vilant 200-62.5-25 MCG/ACT Aepb Commonly known as: Trelegy Ellipta INHALE 1 PUFF ONCE A DAY   HomeNeb with Sidestream Misc See admin instructions.   HYDROcodone-acetaminophen 10-325 MG tablet Commonly known as: NORCO Take 1 tablet by mouth 3 (three) times daily as needed. What changed: when to take this Changed by: Kiva Norland   ipratropium-albuterol 0.5-2.5 (3) MG/3ML Soln Commonly known as: DUONEB Take 3 mLs by nebulization every 6 (six) hours as needed.   Loratadine 10 MG Caps 1 Once A Day Oral   naloxone 4 MG/0.1ML Liqd nasal spray kit Commonly known as: NARCAN   nortriptyline 75 MG  capsule Commonly known as: PAMELOR Take 75 mg by mouth at bedtime.   olmesartan 40 MG tablet Commonly known as: BENICAR Take 1 tablet (40 mg total) by mouth daily.   Praluent 150 MG/ML Soaj Generic drug: Alirocumab Inject 1 mL into the skin every 14 (fourteen) days.   predniSONE 10 MG tablet Commonly known as: DELTASONE Take 5 daily for 2 days followed by 4,3,2 and 1 for 2 days each. Replaces: predniSONE 10 MG (21) Tbpk tablet Started by: Macio Kissoon   Repatha SureClick 140 MG/ML Soaj Generic drug: Evolocumab Inject 1 mL into the skin every 14 (fourteen) days.   tiZANidine 4 MG tablet Commonly known as: ZANAFLEX Take 0.5-1 tablets (2-4 mg total) by mouth at bedtime. What changed:  how much to take when to take this Changed by: Broadus John Zya Finkle         Follow-up: Return in about 6 months (around 07/05/2023).  Mechele Claude, M.D.

## 2023-01-04 LAB — IRON AND TIBC

## 2023-01-04 LAB — SPECIMEN STATUS REPORT

## 2023-01-04 LAB — FERRITIN

## 2023-01-08 ENCOUNTER — Telehealth: Payer: Self-pay | Admitting: Cardiology

## 2023-01-08 NOTE — Telephone Encounter (Signed)
Pt c/o medication issue:  1. Name of Medication:   atorvastatin (LIPITOR) 80 MG tablet    ezetimibe (ZETIA) 10 MG tablet    REPATHA SURECLICK 140 MG/ML SOAJ    2. How are you currently taking this medication (dosage and times per day)?  Take 1 tablet (80 mg total) by mouth daily.     TAKE ONE TABLET DAILY     Inject 1 mL into the skin every 14 (fourteen) days.      3. Are you having a reaction (difficulty breathing--STAT)? No  4. What is your medication issue? Stew called from Pharmacy requesting a callback for clarity on these 3 medications and would like to discuss further with a nurse. Please advise

## 2023-01-08 NOTE — Telephone Encounter (Signed)
Called Stew from the pharmacy back. Clarification given on lipitor, atrovastatin and repatha. Pt is to continue these medications per information pulled from the chart.

## 2023-01-09 NOTE — Progress Notes (Signed)
Patient r/c  

## 2023-01-10 ENCOUNTER — Telehealth: Payer: Self-pay | Admitting: Cardiology

## 2023-01-10 DIAGNOSIS — M5134 Other intervertebral disc degeneration, thoracic region: Secondary | ICD-10-CM | POA: Diagnosis not present

## 2023-01-10 DIAGNOSIS — Z79899 Other long term (current) drug therapy: Secondary | ICD-10-CM | POA: Diagnosis not present

## 2023-01-10 DIAGNOSIS — M47814 Spondylosis without myelopathy or radiculopathy, thoracic region: Secondary | ICD-10-CM | POA: Diagnosis not present

## 2023-01-10 DIAGNOSIS — G894 Chronic pain syndrome: Secondary | ICD-10-CM | POA: Diagnosis not present

## 2023-01-10 DIAGNOSIS — M797 Fibromyalgia: Secondary | ICD-10-CM | POA: Diagnosis not present

## 2023-01-10 DIAGNOSIS — M546 Pain in thoracic spine: Secondary | ICD-10-CM | POA: Diagnosis not present

## 2023-01-10 DIAGNOSIS — Z79891 Long term (current) use of opiate analgesic: Secondary | ICD-10-CM | POA: Diagnosis not present

## 2023-01-10 DIAGNOSIS — M961 Postlaminectomy syndrome, not elsewhere classified: Secondary | ICD-10-CM | POA: Diagnosis not present

## 2023-01-10 DIAGNOSIS — M25569 Pain in unspecified knee: Secondary | ICD-10-CM | POA: Diagnosis not present

## 2023-01-10 DIAGNOSIS — M25529 Pain in unspecified elbow: Secondary | ICD-10-CM | POA: Diagnosis not present

## 2023-01-10 DIAGNOSIS — M5136 Other intervertebral disc degeneration, lumbar region: Secondary | ICD-10-CM | POA: Diagnosis not present

## 2023-01-10 DIAGNOSIS — M47812 Spondylosis without myelopathy or radiculopathy, cervical region: Secondary | ICD-10-CM | POA: Diagnosis not present

## 2023-01-10 NOTE — Telephone Encounter (Signed)
Returned pt call. She wants clarification from a call earlier. She just needs to know what cholesterol medication she is to be taking or not taking. Let pt know I would forward this to the provider and his nurse.

## 2023-01-10 NOTE — Telephone Encounter (Signed)
Pt is requesting a callback regarding her medications. She couldn't remember the names of then due to her driving but she'd like to discuss further with nurse. Please advise

## 2023-01-10 NOTE — Telephone Encounter (Signed)
Message sent to pharmacist for clarification on cholesterol medications.

## 2023-01-11 NOTE — Telephone Encounter (Signed)
Spoke with patient and explained that she could probably stop the ezetimibe, but would need to stay on other 2, to keep cholesterol down.  She is happy with the current results and chooses to stay on all 3 medications.    Spoke with pharmacist, explained that patient will need statin, zetia and repatha for cholesterol management.  He thanked [place] for calling to clarify.

## 2023-01-11 NOTE — Telephone Encounter (Signed)
LMOM to return call  LDL 23 on atorvastatin 80, ezetimibe 10 and evolocumab 140.

## 2023-01-23 ENCOUNTER — Encounter: Payer: Self-pay | Admitting: Internal Medicine

## 2023-01-25 ENCOUNTER — Other Ambulatory Visit: Payer: Self-pay | Admitting: Cardiology

## 2023-01-30 DIAGNOSIS — M791 Myalgia, unspecified site: Secondary | ICD-10-CM | POA: Diagnosis not present

## 2023-01-31 ENCOUNTER — Telehealth: Payer: Self-pay | Admitting: Pulmonary Disease

## 2023-01-31 NOTE — Telephone Encounter (Signed)
Patient is calling because she would like to be seen for sleep apnea. Her last appointment was in 2022 with Dr.Mannam. She currently has a Cpap machine but says that she should be using a bipap. Please call and advise [phone]

## 2023-02-05 ENCOUNTER — Inpatient Hospital Stay: Admission: RE | Admit: 2023-02-05 | Payer: Medicare HMO | Source: Ambulatory Visit

## 2023-02-05 NOTE — Telephone Encounter (Signed)
Per chart patient last seen 07/12/21 and asked to follow up in 6 months. No appointment was scheduled at that time.   Patient currently scheduled with Mannam 04/10/23. Nothing further needed at this time.

## 2023-02-06 ENCOUNTER — Encounter: Payer: Self-pay | Admitting: Family Medicine

## 2023-02-06 ENCOUNTER — Ambulatory Visit (INDEPENDENT_AMBULATORY_CARE_PROVIDER_SITE_OTHER): Payer: Medicare HMO | Admitting: Family Medicine

## 2023-02-06 VITALS — BP 127/65 | HR 97 | Temp 97.6°F | Ht 65.0 in | Wt 175.6 lb

## 2023-02-06 DIAGNOSIS — I10 Essential (primary) hypertension: Secondary | ICD-10-CM

## 2023-02-06 DIAGNOSIS — R42 Dizziness and giddiness: Secondary | ICD-10-CM | POA: Diagnosis not present

## 2023-02-06 DIAGNOSIS — G4733 Obstructive sleep apnea (adult) (pediatric): Secondary | ICD-10-CM

## 2023-02-06 MED ORDER — AMLODIPINE BESYLATE 5 MG PO TABS: 5.0000 mg | ORAL_TABLET | Freq: Every day | ORAL | 5 refills | Status: DC

## 2023-02-06 MED ORDER — OLMESARTAN MEDOXOMIL 20 MG PO TABS: 20.0000 mg | ORAL_TABLET | Freq: Every day | ORAL | 1 refills | Status: DC

## 2023-02-06 NOTE — Progress Notes (Signed)
Subjective:  Patient ID: Sarah Chapman, female    DOB: Aug 21, 1960  Age: 62 y.o. MRN: [id number]  CC: Hypertension   HPI Sarah Chapman presents for  follow-up of hypertension. Olmesartan caused her to be light headed, so she cut back to 1/2 . That took care of the side effect, but BP rebounded as high as 189 systolic. Resumed Benicar 40 mg a day, but now light headed again.   Needs new referral to sleep med. Using cpap. Not breathing or sleeping well.Was using Bipap before, but was switched bby previous provider.  That office is in [place], she wants to work with someone who will be closer to home and consider her for Bipap.   Taking magnesium wants to know how to dose. Helps with fibromyalgia.   History Sarah Chapman has a past medical history of Allergic rhinitis, Back pain, Chronic headache, COPD (chronic obstructive pulmonary disease) (HCC), Emphysema of lung (HCC), Hyperlipidemia, Hypertension, Neck pain, Sleep apnea, and Vaginal Pap smear, abnormal.   She has a past surgical history that includes Neck surgery; Tonsillectomy; Cesarean section; Gynecologic cryosurgery; and Colonoscopy (N/A, 01/07/2015).   Her family history includes Bone cancer in her mother; Cancer in her maternal grandmother; Heart attack in her brother; Mental illness in her son.She reports that she quit smoking about 6 years ago. Her smoking use included cigarettes. She started smoking about 39 years ago. She has a 33 pack-year smoking history. She has never used smokeless tobacco. She reports that she does not drink alcohol and does not use drugs.  Current Outpatient Medications on File Prior to Visit  Medication Sig Dispense Refill   albuterol (VENTOLIN HFA) 108 (90 Base) MCG/ACT inhaler Inhale 2 puffs into the lungs every 4 (four) hours as needed for wheezing or shortness of breath. 18 g 11   Alirocumab (PRALUENT) 150 MG/ML SOAJ Inject 1 mL into the skin every 14 (fourteen) days. 2 mL 11   ALPRAZolam (XANAX) 0.5 MG tablet Take  0.25-0.5 mg by mouth daily as needed.     Armodafinil 250 MG tablet Take 250 mg by mouth daily.     aspirin 81 MG EC tablet Take 1 tablet by mouth daily.     atorvastatin (LIPITOR) 80 MG tablet Take 1 tablet (80 mg total) by mouth daily. 90 tablet 3   buPROPion HCl ER, XL, 450 MG TB24 Take 450 mg by mouth daily.     dexlansoprazole (DEXILANT) 60 MG capsule Take 1 capsule (60 mg total) by mouth daily. 90 capsule 3   diphenoxylate-atropine (LOMOTIL) 2.5-0.025 MG tablet Take 2 tablets by mouth 4 (four) times daily as needed for diarrhea or loose stools. 30 tablet 0   ezetimibe (ZETIA) 10 MG tablet TAKE ONE TABLET DAILY 15 tablet 0   Fluticasone-Umeclidin-Vilant (TRELEGY ELLIPTA) 200-62.5-25 MCG/ACT AEPB INHALE 1 PUFF ONCE A DAY 60 each 2   HYDROcodone-acetaminophen (NORCO) 10-325 MG tablet Take 1 tablet by mouth 3 (three) times daily as needed.     ipratropium-albuterol (DUONEB) 0.5-2.5 (3) MG/3ML SOLN Take 3 mLs by nebulization every 6 (six) hours as needed. 360 mL 3   Loratadine 10 MG CAPS 1 Once A Day Oral     naloxone (NARCAN) nasal spray 4 mg/0.1 mL      Nebulizers (HOMENEB WITH SIDESTREAM) MISC See admin instructions.     nortriptyline (PAMELOR) 75 MG capsule Take 75 mg by mouth at bedtime.     REPATHA SURECLICK 140 MG/ML SOAJ Inject 1 mL into the skin every 14 (  fourteen) days.     tiZANidine (ZANAFLEX) 4 MG tablet Take 0.5-1 tablets (2-4 mg total) by mouth at bedtime. 30 tablet 5   No current facility-administered medications on file prior to visit.    ROS Review of Systems  Constitutional: Negative.   HENT: Negative.    Eyes:  Negative for visual disturbance.  Respiratory:  Negative for shortness of breath.   Cardiovascular:  Negative for chest pain.  Gastrointestinal:  Negative for abdominal pain.  Musculoskeletal:  Negative for arthralgias.    Objective:  BP 127/65   Pulse 97   Temp 97.6 F (36.4 C)   Ht 5\' 5"  (1.651 m)   Wt 175 lb 9.6 oz (79.7 kg)   SpO2 99%   BMI  29.22 kg/m   BP Readings from Last 3 Encounters:  02/06/23 127/65  01/02/23 134/69  08/23/22 133/83    Wt Readings from Last 3 Encounters:  02/06/23 175 lb 9.6 oz (79.7 kg)  01/02/23 179 lb (81.2 kg)  09/11/22 178 lb (80.7 kg)     Physical Exam Constitutional:      General: She is not in acute distress.    Appearance: She is well-developed.  Cardiovascular:     Rate and Rhythm: Normal rate and regular rhythm.  Pulmonary:     Breath sounds: Normal breath sounds.  Musculoskeletal:        General: Normal range of motion.  Skin:    General: Skin is warm and dry.  Neurological:     Mental Status: She is alert and oriented to person, place, and time.       Assessment & Plan:   Sarah Chapman was seen today for hypertension.  Diagnoses and all orders for this visit:  Essential hypertension  OSA (obstructive sleep apnea) -     Ambulatory referral to Sleep Studies  Dizziness  Other orders -     olmesartan (BENICAR) 20 MG tablet; Take 1 tablet (20 mg total) by mouth daily. -     amLODipine (NORVASC) 5 MG tablet; Take 1 tablet (5 mg total) by mouth daily. For blood pressure   Allergies as of 02/06/2023       Reactions   Clindamycin/lincomycin Rash   Penicillins    Unknown        Medication List        Accurate as of February 06, 2023  3:54 PM. If you have any questions, ask your nurse or doctor.          STOP taking these medications    predniSONE 10 MG tablet Commonly known as: DELTASONE Stopped by: Jashawn Floyd       TAKE these medications    albuterol 108 (90 Base) MCG/ACT inhaler Commonly known as: VENTOLIN HFA Inhale 2 puffs into the lungs every 4 (four) hours as needed for wheezing or shortness of breath.   ALPRAZolam 0.5 MG tablet Commonly known as: XANAX Take 0.25-0.5 mg by mouth daily as needed.   amLODipine 5 MG tablet Commonly known as: NORVASC Take 1 tablet (5 mg total) by mouth daily. For blood pressure Started by: Li Fragoso   Armodafinil 250 MG tablet Take 250 mg by mouth daily.   aspirin EC 81 MG tablet Take 1 tablet by mouth daily.   atorvastatin 80 MG tablet Commonly known as: LIPITOR Take 1 tablet (80 mg total) by mouth daily.   buPROPion HCl ER (XL) 450 MG Tb24 Take 450 mg by mouth daily.   dexlansoprazole 60 MG capsule Commonly known  as: Dexilant Take 1 capsule (60 mg total) by mouth daily.   diphenoxylate-atropine 2.5-0.025 MG tablet Commonly known as: Lomotil Take 2 tablets by mouth 4 (four) times daily as needed for diarrhea or loose stools.   ezetimibe 10 MG tablet Commonly known as: ZETIA TAKE ONE TABLET DAILY   Fluticasone-Umeclidin-Vilant 200-62.5-25 MCG/ACT Aepb Commonly known as: Trelegy Ellipta INHALE 1 PUFF ONCE A DAY   HomeNeb with Sidestream Misc See admin instructions.   HYDROcodone-acetaminophen 10-325 MG tablet Commonly known as: NORCO Take 1 tablet by mouth 3 (three) times daily as needed.   ipratropium-albuterol 0.5-2.5 (3) MG/3ML Soln Commonly known as: DUONEB Take 3 mLs by nebulization every 6 (six) hours as needed.   Loratadine 10 MG Caps 1 Once A Day Oral   naloxone 4 MG/0.1ML Liqd nasal spray kit Commonly known as: NARCAN   nortriptyline 75 MG capsule Commonly known as: PAMELOR Take 75 mg by mouth at bedtime.   olmesartan 20 MG tablet Commonly known as: BENICAR Take 1 tablet (20 mg total) by mouth daily. What changed:  medication strength how much to take Changed by: Clover Feehan   Praluent 150 MG/ML Soaj Generic drug: Alirocumab Inject 1 mL into the skin every 14 (fourteen) days.   Repatha SureClick 140 MG/ML Soaj Generic drug: Evolocumab Inject 1 mL into the skin every 14 (fourteen) days.   tiZANidine 4 MG tablet Commonly known as: ZANAFLEX Take 0.5-1 tablets (2-4 mg total) by mouth at bedtime.        Meds ordered this encounter  Medications   olmesartan (BENICAR) 20 MG tablet    Sig: Take 1 tablet (20 mg total) by  mouth daily.    Dispense:  90 tablet    Refill:  1   amLODipine (NORVASC) 5 MG tablet    Sig: Take 1 tablet (5 mg total) by mouth daily. For blood pressure    Dispense:  30 tablet    Refill:  5    OTC magnesium 200 mg BID recommended.   Follow-up: Return in about 1 month (around 03/08/2023).  Mechele Claude, M.D.

## 2023-02-12 ENCOUNTER — Other Ambulatory Visit: Payer: Self-pay | Admitting: Cardiology

## 2023-02-12 DIAGNOSIS — M546 Pain in thoracic spine: Secondary | ICD-10-CM | POA: Diagnosis not present

## 2023-02-12 DIAGNOSIS — M961 Postlaminectomy syndrome, not elsewhere classified: Secondary | ICD-10-CM | POA: Diagnosis not present

## 2023-02-12 DIAGNOSIS — Z79891 Long term (current) use of opiate analgesic: Secondary | ICD-10-CM | POA: Diagnosis not present

## 2023-02-12 DIAGNOSIS — M797 Fibromyalgia: Secondary | ICD-10-CM | POA: Diagnosis not present

## 2023-02-12 DIAGNOSIS — M47812 Spondylosis without myelopathy or radiculopathy, cervical region: Secondary | ICD-10-CM | POA: Diagnosis not present

## 2023-02-12 DIAGNOSIS — Z79899 Other long term (current) drug therapy: Secondary | ICD-10-CM | POA: Diagnosis not present

## 2023-02-12 DIAGNOSIS — M25569 Pain in unspecified knee: Secondary | ICD-10-CM | POA: Diagnosis not present

## 2023-02-12 DIAGNOSIS — M47814 Spondylosis without myelopathy or radiculopathy, thoracic region: Secondary | ICD-10-CM | POA: Diagnosis not present

## 2023-02-12 DIAGNOSIS — M25529 Pain in unspecified elbow: Secondary | ICD-10-CM | POA: Diagnosis not present

## 2023-02-12 DIAGNOSIS — M5134 Other intervertebral disc degeneration, thoracic region: Secondary | ICD-10-CM | POA: Diagnosis not present

## 2023-02-12 DIAGNOSIS — G894 Chronic pain syndrome: Secondary | ICD-10-CM | POA: Diagnosis not present

## 2023-02-12 DIAGNOSIS — M5136 Other intervertebral disc degeneration, lumbar region: Secondary | ICD-10-CM | POA: Diagnosis not present

## 2023-02-19 DIAGNOSIS — F900 Attention-deficit hyperactivity disorder, predominantly inattentive type: Secondary | ICD-10-CM | POA: Diagnosis not present

## 2023-02-19 DIAGNOSIS — G8921 Chronic pain due to trauma: Secondary | ICD-10-CM | POA: Diagnosis not present

## 2023-02-19 DIAGNOSIS — F339 Major depressive disorder, recurrent, unspecified: Secondary | ICD-10-CM | POA: Diagnosis not present

## 2023-02-19 DIAGNOSIS — F411 Generalized anxiety disorder: Secondary | ICD-10-CM | POA: Diagnosis not present

## 2023-02-20 ENCOUNTER — Other Ambulatory Visit: Payer: Self-pay | Admitting: Gastroenterology

## 2023-02-21 ENCOUNTER — Ambulatory Visit
Admission: RE | Admit: 2023-02-21 | Discharge: 2023-02-21 | Disposition: A | Discharge status: Home / Self Care | Payer: Medicare HMO | Source: Ambulatory Visit | Attending: Family Medicine | Admitting: Family Medicine

## 2023-02-21 DIAGNOSIS — Z1231 Encounter for screening mammogram for malignant neoplasm of breast: Secondary | ICD-10-CM

## 2023-02-22 ENCOUNTER — Other Ambulatory Visit: Payer: Self-pay | Admitting: Pulmonary Disease

## 2023-02-22 ENCOUNTER — Ambulatory Visit: Payer: Medicare HMO | Attending: Physician Assistant | Admitting: Physician Assistant

## 2023-02-22 ENCOUNTER — Encounter: Payer: Self-pay | Admitting: Physician Assistant

## 2023-02-22 VITALS — BP 120/70 | HR 82 | Ht 65.0 in | Wt 178.0 lb

## 2023-02-22 DIAGNOSIS — I2510 Atherosclerotic heart disease of native coronary artery without angina pectoris: Secondary | ICD-10-CM

## 2023-02-22 DIAGNOSIS — G4733 Obstructive sleep apnea (adult) (pediatric): Secondary | ICD-10-CM | POA: Diagnosis not present

## 2023-02-22 DIAGNOSIS — I6523 Occlusion and stenosis of bilateral carotid arteries: Secondary | ICD-10-CM | POA: Diagnosis not present

## 2023-02-22 DIAGNOSIS — Z87891 Personal history of nicotine dependence: Secondary | ICD-10-CM

## 2023-02-22 DIAGNOSIS — I10 Essential (primary) hypertension: Secondary | ICD-10-CM | POA: Diagnosis not present

## 2023-02-22 DIAGNOSIS — E785 Hyperlipidemia, unspecified: Secondary | ICD-10-CM | POA: Diagnosis not present

## 2023-02-22 DIAGNOSIS — R42 Dizziness and giddiness: Secondary | ICD-10-CM | POA: Diagnosis not present

## 2023-02-22 DIAGNOSIS — R400 Somnolence: Secondary | ICD-10-CM

## 2023-02-22 DIAGNOSIS — R0683 Snoring: Secondary | ICD-10-CM

## 2023-02-22 NOTE — Progress Notes (Signed)
Cardiology Office Note:    Date:  02/22/2023   ID:  Sarah Chapman, DOB March 06, 1961, MRN [id number]  PCP:  Mechele Claude, MD   [facility] Providers Cardiologist:  Little Ishikawa, MD Cardiology APP:  Marcelino Duster, PA     Referring MD: Mechele Claude, MD   Chief Complaint  Patient presents with   Follow-up    dizziness    History of Present Illness:    Sarah Chapman is a 62 y.o. female with a hx of Sarah Chapman is a 62 y.o. female with a hx of hypertension, bilateral carotid artery stenosis, OSA, GERD, former smoker.  She established care with cardiology in 08/2019 for dyspnea on exertion and daily chest pain.  Coronary CTA on 10/23/2019 showed calcium score of 411 placing her at the 98th percentile, nonobstructive CAD with mixed plaque in the proximal LAD with 50 to 69% stenosis in a FFR of 0.84, 0 to 24% stenosis in the proximal circumflex and 25 to 49% stenosis in the mid circumflex with an FFR of 0.82, mixed plaque in the proximal and mid RCA with 25 to 49% stenosis.  Medical management was recommended.  In response to leg pain, LE Dopplers were normal.  She follows with pulmonology for COPD with 22 pack year history.  I saw her in follow up 09/2021 and she had self-discontinued trelegy and CPAP stating she could breathe better without both. She was walking 1 mile in 30 min. She reported bendopnea, she had orthostatic hypotension. She saw Dr. Bjorn Pippin 3 months later and continued to report DOE leading to nuclear stress test that was nonischemic but showed reduced EF. Follow up echo 01/2022 showed preserved LVEF 60-65%, no WMA, normal RV, and no significant valvular disease.   She presents today for scheduled follow up. She states she feels short of breath and lightheaded from time to time. BP at home has been 107-120 systolic. SOB occurs with rest and with minimal walking. She is  now taking proair and and nebulizers (but she can't find her machine). She is now back on  trelegy. She is concerned that her sleep apnea is not being treated. She used to have BiPAP now has CPAP, but not using anything because she can still gasp for air on the CPAP. She is now taking nuvigil for daytime fatigue.      Past Medical History:  Diagnosis Date   Allergic rhinitis    Back pain    Chronic headache    COPD (chronic obstructive pulmonary disease) (HCC)    Emphysema of lung (HCC)    Hyperlipidemia    Hypertension    Neck pain    Sleep apnea    Vaginal Pap smear, abnormal     Past Surgical History:  Procedure Laterality Date   CESAREAN SECTION     COLONOSCOPY N/A 01/07/2015   internal hemorrhoids, diverticulosis in sigmoid colon, exam otherwise normal, no biopsies.   GYNECOLOGIC CRYOSURGERY     NECK SURGERY     TONSILLECTOMY      Current Medications: Current Meds  Medication Sig   albuterol (VENTOLIN HFA) 108 (90 Base) MCG/ACT inhaler Inhale 2 puffs into the lungs every 4 (four) hours as needed for wheezing or shortness of breath.   ALPRAZolam (XANAX) 0.5 MG tablet Take 0.25-0.5 mg by mouth daily as needed.   amLODipine (NORVASC) 5 MG tablet Take 1 tablet (5 mg total) by mouth daily. For blood pressure   Armodafinil 250 MG tablet Take  250 mg by mouth daily.   aspirin 81 MG EC tablet Take 1 tablet by mouth daily.   atorvastatin (LIPITOR) 80 MG tablet Take 1 tablet (80 mg total) by mouth daily.   buPROPion HCl ER, XL, 450 MG TB24 Take 450 mg by mouth daily.   dexlansoprazole (DEXILANT) 60 MG capsule TAKE ONE CAPSULE ONCE DAILY   ezetimibe (ZETIA) 10 MG tablet TAKE ONE TABLET DAILY   Fluticasone-Umeclidin-Vilant (TRELEGY ELLIPTA) 200-62.5-25 MCG/ACT AEPB INHALE 1 PUFF ONCE A DAY   HYDROcodone-acetaminophen (NORCO) 10-325 MG tablet Take 1 tablet by mouth 3 (three) times daily as needed.   ipratropium-albuterol (DUONEB) 0.5-2.5 (3) MG/3ML SOLN Take 3 mLs by nebulization every 6 (six) hours as needed.   Loratadine 10 MG CAPS 1 Once A Day Oral   naloxone (NARCAN)  nasal spray 4 mg/0.1 mL    Nebulizers (HOMENEB WITH SIDESTREAM) MISC See admin instructions.   nortriptyline (PAMELOR) 75 MG capsule Take 75 mg by mouth at bedtime.   olmesartan (BENICAR) 20 MG tablet Take 1 tablet (20 mg total) by mouth daily.   REPATHA SURECLICK 140 MG/ML SOAJ Inject 1 mL into the skin every 14 (fourteen) days.   tiZANidine (ZANAFLEX) 4 MG tablet Take 0.5-1 tablets (2-4 mg total) by mouth at bedtime.     Allergies:   Clindamycin/lincomycin and Penicillins   Social History   Socioeconomic History   Marital status: Divorced    Spouse name: Not on file   Number of children: Not on file   Years of education: Not on file   Highest education level: Not on file  Occupational History   Not on file  Tobacco Use   Smoking status: Former    Current packs/day: 0.00    Average packs/day: 1 pack/day for 33.0 years (33.0 ttl pk-yrs)    Types: Cigarettes    Start date: 07/26/1983    Quit date: 07/25/2016    Years since quitting: 6.5   Smokeless tobacco: Never  Vaping Use   Vaping status: Never Used  Substance and Sexual Activity   Alcohol use: No   Drug use: No   Sexual activity: Not Currently    Birth control/protection: Post-menopausal  Other Topics Concern   Not on file  Social History Narrative   Not on file   Social Determinants of Health   Financial Resource Strain: Low Risk  (09/11/2022)   Overall Financial Resource Strain (CARDIA)    Difficulty of Paying Living Expenses: Not hard at all  Food Insecurity: No Food Insecurity (09/11/2022)   Hunger Vital Sign    Worried About Running Out of Food in the Last Year: Never true    Ran Out of Food in the Last Year: Never true  Transportation Needs: No Transportation Needs (09/11/2022)   PRAPARE - Administrator, Civil Service (Medical): No    Lack of Transportation (Non-Medical): No  Physical Activity: Insufficiently Active (09/11/2022)   Exercise Vital Sign    Days of Exercise per Week: 3 days     Minutes of Exercise per Session: 30 min  Stress: No Stress Concern Present (09/11/2022)   [employer] of Occupational Health - Occupational Stress Questionnaire    Feeling of Stress : Not at all  Social Connections: Socially Integrated (09/11/2022)   Social Connection and Isolation Panel [NHANES]    Frequency of Communication with Friends and Family: More than three times a week    Frequency of Social Gatherings with Friends and Family: More than three times a  week    Attends Religious Services: More than 4 times per year    Active Member of Clubs or Organizations: Yes    Attends Banker Meetings: More than 4 times per year    Marital Status: Living with partner     Family History: The patient's family history includes Bone cancer in her mother; Cancer in her maternal grandmother; Heart attack in her brother; Mental illness in her son. There is no history of Colon cancer, Colon polyps, or Breast cancer.  ROS:   Please see the history of present illness.     All other systems reviewed and are negative.  EKGs/Labs/Other Studies Reviewed:    The following studies were reviewed today:  Echo 01/2022:  1. Left ventricular ejection fraction, by estimation, is 60 to 65%. The  left ventricle has normal function. The left ventricle has no regional  wall motion abnormalities. Left ventricular diastolic parameters are  consistent with Grade I diastolic  dysfunction (impaired relaxation). The average left ventricular global  longitudinal strain is -18.0 %. The global longitudinal strain is normal.   2. Right ventricular systolic function is normal. The right ventricular  size is normal. Tricuspid regurgitation signal is inadequate for assessing  PA pressure.   3. The mitral valve is grossly normal with mild annular calcification.  Trivial mitral valve regurgitation.   4. The aortic valve is tricuspid. Aortic valve regurgitation is not  visualized.   5. The inferior vena cava  is normal in size with greater than 50%  respiratory variability, suggesting right atrial pressure of 3 mmHg.    Nuclear stress test 12/2021:   The study is intermediate risk.   No ST deviation was noted.   Left ventricular function is normal. Nuclear stress EF: 40 %. The left ventricular ejection fraction is moderately decreased (30-44%). End diastolic cavity size is normal. End systolic cavity size is normal.   Prior study not available for comparison.   There are no significant perfusion defects seen at both rest and stress.   This is an intermediate risk study based upon reduced ejection fraction of 40%.  Prior echocardiogram in 2021 showed ejection fraction of 60 to 65%.  EKG Interpretation Date/Time:  Thursday February 22 2023 11:19:38 EDT Ventricular Rate:  84 PR Interval:  162 QRS Duration:  94 QT Interval:  376 QTC Calculation: 444 R Axis:   76  Text Interpretation: Normal sinus rhythm Normal ECG No previous ECGs available Confirmed by Micah Flesher ([id number]) on 02/22/2023 11:28:46 AM    Recent Labs: 01/02/2023: ALT 45; BUN 9; Creatinine, Ser 1.01; Hemoglobin 10.9; Platelets 383; Potassium 4.3; Sodium 143  Recent Lipid Panel    Component Value Date/Time   CHOL 124 01/02/2023 1443   TRIG 102 01/02/2023 1443   HDL 83 01/02/2023 1443   CHOLHDL 1.5 01/02/2023 1443   LDLCALC 23 01/02/2023 1443     Risk Assessment/Calculations:           STOP-Bang Score:  5       Physical Exam:    VS:  BP 120/70 (BP Location: Left Arm, Patient Position: Sitting, Cuff Size: Normal)   Pulse 82   Ht 5\' 5"  (1.651 m)   Wt 178 lb (80.7 kg)   SpO2 100%   BMI 29.62 kg/m     Wt Readings from Last 3 Encounters:  02/22/23 178 lb (80.7 kg)  02/06/23 175 lb 9.6 oz (79.7 kg)  01/02/23 179 lb (81.2 kg)     GEN:  Well nourished, well developed in no acute distress HEENT: Normal NECK: No JVD; No carotid bruits LYMPHATICS: No lymphadenopathy CARDIAC: RRR, no murmurs, rubs,  gallops RESPIRATORY:  Clear to auscultation without rales, wheezing or rhonchi  ABDOMEN: Soft, non-tender, non-distended MUSCULOSKELETAL:  No edema; No deformity  SKIN: Warm and dry NEUROLOGIC:  Alert and oriented x 3 PSYCHIATRIC:  Normal affect   ASSESSMENT:    1. Essential hypertension   2. Obstructive sleep apnea   3. Snoring   4. Daytime somnolence   5. Hyperlipidemia, unspecified hyperlipidemia type   6. Coronary artery disease involving native coronary artery of native heart without angina pectoris   7. Dizziness   8. Bilateral carotid artery stenosis   9. OSA (obstructive sleep apnea)   10. Former smoker    PLAN:    In order of problems listed above:  CAD - atypical chest pain Coronary CTA with nonobstructive and FFR negative stenosis Continue 80 mg Lipitor, ASA Recent reassuring nuclear stress test and echo   Hyperlipidemia with LDL goal less than 70 Lipitor 80 mg -LDL remained above goal and Zetia was added, now on PCSK9i 01/02/2023: Cholesterol, Total 124; HDL 83; LDL Chol Calc (NIH) 23; Triglycerides 102 She is taking repatha   Hypertension - 5 mg amlodipine, 20 mg olmesartan - BMP in 12/2022 stable   Carotid artery stenosis Most recent ultrasound with stable disease No syncope, but lightheaded Was seeing vascular surgery [facility] Nov 2023 with 40-59% R ICA and 60-79% in the left ICA, plan to repeat Nov 2024 If VVS needed, needs referral to [facility]   SOB OSA COPD - I suspect OSA and COPD is the etiology for her dyspnea - she states she used to be on BiPAP, but now on CPAP - OSA followed by [facility] neurology, but she hasn't seen anyone in 2 years - will submit for an in-house split night study since she used to require BiPAP      Follow up in 6 months.      Medication Adjustments/Labs and Tests Ordered: Current medicines are reviewed at length with the patient today.  Concerns regarding medicines are outlined above.  Orders Placed This  Encounter  Procedures   EKG 12-Lead   Split night study   No orders of the defined types were placed in this encounter.   Patient Instructions  Medication Instructions:  No changes *If you need a refill on your cardiac medications before your next appointment, please call your pharmacy*  Testing/Procedures: Your physician has recommended that you have a sleep study. This test records several body functions during sleep, including: brain activity, eye movement, oxygen and carbon dioxide blood levels, heart rate and rhythm, breathing rate and rhythm, the flow of air through your mouth and nose, snoring, body muscle movements, and chest and belly movement.   Someone will contact you to set up this testing.    Follow-Up: At [facility], you and your health needs are our priority.  As part of our continuing mission to provide you with exceptional heart care, we have created designated Provider Care Teams.  These Care Teams include your primary Cardiologist (physician) and Advanced Practice Providers (APPs -  Physician Assistants and Nurse Practitioners) who all work together to provide you with the care you need, when you need it.  We recommend signing up for the patient portal called "MyChart".  Sign up information is provided on this After Visit Summary.  MyChart is used to connect with patients for Virtual Visits (  Telemedicine).  Patients are able to view lab/test results, encounter notes, upcoming appointments, etc.  Non-urgent messages can be sent to your provider as well.   To learn more about what you can do with MyChart, go to [email or web address].    Your next appointment:   6 month(s)  Provider:   Micah Flesher, PA-C          Signed, Roe Rutherford Tuyet Bader, [place]  02/22/2023 11:56 AM    [facility]

## 2023-02-22 NOTE — Patient Instructions (Signed)
Medication Instructions:  No changes *If you need a refill on your cardiac medications before your next appointment, please call your pharmacy*  Testing/Procedures: Your physician has recommended that you have a sleep study. This test records several body functions during sleep, including: brain activity, eye movement, oxygen and carbon dioxide blood levels, heart rate and rhythm, breathing rate and rhythm, the flow of air through your mouth and nose, snoring, body muscle movements, and chest and belly movement.   Someone will contact you to set up this testing.    Follow-Up: At [facility], you and your health needs are our priority.  As part of our continuing mission to provide you with exceptional heart care, we have created designated Provider Care Teams.  These Care Teams include your primary Cardiologist (physician) and Advanced Practice Providers (APPs -  Physician Assistants and Nurse Practitioners) who all work together to provide you with the care you need, when you need it.  We recommend signing up for the patient portal called "MyChart".  Sign up information is provided on this After Visit Summary.  MyChart is used to connect with patients for Virtual Visits (Telemedicine).  Patients are able to view lab/test results, encounter notes, upcoming appointments, etc.  Non-urgent messages can be sent to your provider as well.   To learn more about what you can do with MyChart, go to [email or web address].    Your next appointment:   6 month(s)  Provider:   Micah Flesher, PA-C

## 2023-02-26 ENCOUNTER — Encounter (HOSPITAL_COMMUNITY): Payer: Medicare HMO

## 2023-03-06 ENCOUNTER — Ambulatory Visit: Payer: Medicare HMO | Attending: Physician Assistant

## 2023-03-06 DIAGNOSIS — I6529 Occlusion and stenosis of unspecified carotid artery: Secondary | ICD-10-CM | POA: Diagnosis not present

## 2023-03-07 ENCOUNTER — Other Ambulatory Visit: Payer: Self-pay | Admitting: Cardiology

## 2023-03-12 ENCOUNTER — Telehealth: Payer: Self-pay

## 2023-03-12 DIAGNOSIS — M25529 Pain in unspecified elbow: Secondary | ICD-10-CM | POA: Diagnosis not present

## 2023-03-12 DIAGNOSIS — G894 Chronic pain syndrome: Secondary | ICD-10-CM | POA: Diagnosis not present

## 2023-03-12 DIAGNOSIS — M546 Pain in thoracic spine: Secondary | ICD-10-CM | POA: Diagnosis not present

## 2023-03-12 DIAGNOSIS — M25569 Pain in unspecified knee: Secondary | ICD-10-CM | POA: Diagnosis not present

## 2023-03-12 DIAGNOSIS — Z79899 Other long term (current) drug therapy: Secondary | ICD-10-CM | POA: Diagnosis not present

## 2023-03-12 DIAGNOSIS — M47814 Spondylosis without myelopathy or radiculopathy, thoracic region: Secondary | ICD-10-CM | POA: Diagnosis not present

## 2023-03-12 DIAGNOSIS — Z79891 Long term (current) use of opiate analgesic: Secondary | ICD-10-CM | POA: Diagnosis not present

## 2023-03-12 DIAGNOSIS — M5134 Other intervertebral disc degeneration, thoracic region: Secondary | ICD-10-CM | POA: Diagnosis not present

## 2023-03-12 DIAGNOSIS — M961 Postlaminectomy syndrome, not elsewhere classified: Secondary | ICD-10-CM | POA: Diagnosis not present

## 2023-03-12 DIAGNOSIS — M51361 Other intervertebral disc degeneration, lumbar region with lower extremity pain only: Secondary | ICD-10-CM | POA: Diagnosis not present

## 2023-03-12 DIAGNOSIS — M797 Fibromyalgia: Secondary | ICD-10-CM | POA: Diagnosis not present

## 2023-03-12 DIAGNOSIS — M47812 Spondylosis without myelopathy or radiculopathy, cervical region: Secondary | ICD-10-CM | POA: Diagnosis not present

## 2023-03-12 NOTE — Telephone Encounter (Addendum)
Called patient regarding results. Patient had understanding of results.----- Message from Cannon Kettle sent at 03/06/2023  8:18 PM EDT ----- Evidence of moderate bilateral carotid stenosis.   -Recommend follow-up duplex in 12 months. -Continue aspirin, statin.

## 2023-03-13 ENCOUNTER — Ambulatory Visit (INDEPENDENT_AMBULATORY_CARE_PROVIDER_SITE_OTHER): Payer: Medicare HMO | Admitting: Gastroenterology

## 2023-03-13 ENCOUNTER — Ambulatory Visit: Payer: Medicare HMO | Admitting: Family Medicine

## 2023-03-13 ENCOUNTER — Other Ambulatory Visit: Payer: Self-pay | Admitting: Physician Assistant

## 2023-03-13 ENCOUNTER — Encounter: Payer: Self-pay | Admitting: Gastroenterology

## 2023-03-13 VITALS — BP 151/86 | HR 88 | Temp 97.8°F | Ht 65.0 in | Wt 180.4 lb

## 2023-03-13 DIAGNOSIS — K219 Gastro-esophageal reflux disease without esophagitis: Secondary | ICD-10-CM

## 2023-03-13 DIAGNOSIS — R1031 Right lower quadrant pain: Secondary | ICD-10-CM

## 2023-03-13 DIAGNOSIS — K5900 Constipation, unspecified: Secondary | ICD-10-CM | POA: Diagnosis not present

## 2023-03-13 DIAGNOSIS — M47812 Spondylosis without myelopathy or radiculopathy, cervical region: Secondary | ICD-10-CM

## 2023-03-13 MED ORDER — DEXLANSOPRAZOLE 60 MG PO CPDR: 1.0000 | DELAYED_RELEASE_CAPSULE | Freq: Every day | ORAL | 3 refills | Status: DC

## 2023-03-13 NOTE — Progress Notes (Signed)
Gastroenterology Office Note     Primary Care Physician:  Mechele Claude, MD  Primary Gastroenterologist: Dr. Marletta Lor    Chief Complaint   Chief Complaint  Patient presents with   Follow-up    Pt here for follow up on GERD and refills on medications     History of Present Illness   Sarah Chapman is a 62 y.o. female presenting today with a history of constipation and GERD for one year follow-up.   Eats oatmeal and blueberries for morning and evening with excellent results. Will sometimes have to take a laxative but not often and much improved from last visit No rectal bleeding.   GERD controlled on Dexilant. States this is an excellent medication for her. No dysphagia. Notes vague RLQ discomfort if eating too much but not consistent. No overt GI bleeding. No unexplained weight loss or lack of appetite.   Colonoscopy 2016: internal hemorrhoids and diverticulosis    Past Medical History:  Diagnosis Date   Allergic rhinitis    Back pain    Chronic headache    COPD (chronic obstructive pulmonary disease) (HCC)    Emphysema of lung (HCC)    Hyperlipidemia    Hypertension    Neck pain    Sleep apnea    Vaginal Pap smear, abnormal     Past Surgical History:  Procedure Laterality Date   CESAREAN SECTION     COLONOSCOPY N/A 01/07/2015   internal hemorrhoids, diverticulosis in sigmoid colon, exam otherwise normal, no biopsies.   GYNECOLOGIC CRYOSURGERY     NECK SURGERY     TONSILLECTOMY      Current Outpatient Medications  Medication Sig Dispense Refill   albuterol (VENTOLIN HFA) 108 (90 Base) MCG/ACT inhaler Inhale 2 puffs into the lungs every 4 (four) hours as needed for wheezing or shortness of breath. 18 g 11   ALPRAZolam (XANAX) 0.5 MG tablet Take 0.25-0.5 mg by mouth daily as needed.     amLODipine (NORVASC) 5 MG tablet Take 1 tablet (5 mg total) by mouth daily. For blood pressure 30 tablet 5   Armodafinil 250 MG tablet Take 250 mg by mouth daily.     aspirin 81  MG EC tablet Take 1 tablet by mouth daily.     atorvastatin (LIPITOR) 80 MG tablet Take 1 tablet (80 mg total) by mouth daily. 90 tablet 3   buPROPion HCl ER, XL, 450 MG TB24 Take 450 mg by mouth daily.     dexlansoprazole (DEXILANT) 60 MG capsule TAKE ONE CAPSULE ONCE DAILY 90 capsule 1   ezetimibe (ZETIA) 10 MG tablet TAKE ONE TABLET DAILY 90 tablet 3   HYDROcodone-acetaminophen (NORCO) 10-325 MG tablet Take 1 tablet by mouth 3 (three) times daily as needed.     ipratropium-albuterol (DUONEB) 0.5-2.5 (3) MG/3ML SOLN Take 3 mLs by nebulization every 6 (six) hours as needed. 360 mL 3   Loratadine 10 MG CAPS 1 Once A Day Oral     naloxone (NARCAN) nasal spray 4 mg/0.1 mL      Nebulizers (HOMENEB WITH SIDESTREAM) MISC See admin instructions.     nortriptyline (PAMELOR) 75 MG capsule Take 75 mg by mouth at bedtime.     olmesartan (BENICAR) 20 MG tablet Take 1 tablet (20 mg total) by mouth daily. 90 tablet 1   REPATHA SURECLICK 140 MG/ML SOAJ Inject 1 mL into the skin every 14 (fourteen) days.     tiZANidine (ZANAFLEX) 4 MG tablet Take 0.5-1 tablets (2-4 mg total) by  mouth at bedtime. 30 tablet 5   TRELEGY ELLIPTA 200-62.5-25 MCG/ACT AEPB INHALE 1 PUFF ONCE DAILY 60 each 2   diphenoxylate-atropine (LOMOTIL) 2.5-0.025 MG tablet Take 2 tablets by mouth 4 (four) times daily as needed for diarrhea or loose stools. (Patient not taking: Reported on 02/22/2023) 30 tablet 0   No current facility-administered medications for this visit.    Allergies as of 03/13/2023 - Review Complete 03/13/2023  Allergen Reaction Noted   Clindamycin/lincomycin Rash 11/27/2012   Penicillins  04/03/2014    Family History  Problem Relation Age of Onset   Bone cancer Mother    Cancer Maternal Grandmother    Heart attack Brother    Mental illness Son    Colon cancer Neg Hx    Colon polyps Neg Hx    Breast cancer Neg Hx     Social History   Socioeconomic History   Marital status: Divorced    Spouse name: Not on  file   Number of children: Not on file   Years of education: Not on file   Highest education level: Not on file  Occupational History   Not on file  Tobacco Use   Smoking status: Former    Current packs/day: 0.00    Average packs/day: 1 pack/day for 33.0 years (33.0 ttl pk-yrs)    Types: Cigarettes    Start date: 07/26/1983    Quit date: 07/25/2016    Years since quitting: 6.6   Smokeless tobacco: Never  Vaping Use   Vaping status: Never Used  Substance and Sexual Activity   Alcohol use: No   Drug use: No   Sexual activity: Not Currently    Birth control/protection: Post-menopausal  Other Topics Concern   Not on file  Social History Narrative   Not on file   Social Determinants of Health   Financial Resource Strain: Low Risk  (09/11/2022)   Overall Financial Resource Strain (CARDIA)    Difficulty of Paying Living Expenses: Not hard at all  Food Insecurity: No Food Insecurity (09/11/2022)   Hunger Vital Sign    Worried About Running Out of Food in the Last Year: Never true    Ran Out of Food in the Last Year: Never true  Transportation Needs: No Transportation Needs (09/11/2022)   PRAPARE - Administrator, Civil Service (Medical): No    Lack of Transportation (Non-Medical): No  Physical Activity: Insufficiently Active (09/11/2022)   Exercise Vital Sign    Days of Exercise per Week: 3 days    Minutes of Exercise per Session: 30 min  Stress: No Stress Concern Present (09/11/2022)   [employer] of Occupational Health - Occupational Stress Questionnaire    Feeling of Stress : Not at all  Social Connections: Socially Integrated (09/11/2022)   Social Connection and Isolation Panel [NHANES]    Frequency of Communication with Friends and Family: More than three times a week    Frequency of Social Gatherings with Friends and Family: More than three times a week    Attends Religious Services: More than 4 times per year    Active Member of [employer] or Organizations: Yes     Attends Banker Meetings: More than 4 times per year    Marital Status: Living with partner  Intimate Partner Violence: Not At Risk (09/11/2022)   Humiliation, Afraid, Rape, and Kick questionnaire    Fear of Current or Ex-Partner: No    Emotionally Abused: No    Physically Abused: No  Sexually Abused: No     Review of Systems   Gen: Denies any fever, chills, fatigue, weight loss, lack of appetite.  CV: Denies chest pain, heart palpitations, peripheral edema, syncope.  Resp: Denies shortness of breath at rest or with exertion. Denies wheezing or cough.  GI: Denies dysphagia or odynophagia. Denies jaundice, hematemesis, fecal incontinence. GU : Denies urinary burning, urinary frequency, urinary hesitancy MS: Denies joint pain, muscle weakness, cramps, or limitation of movement.  Derm: Denies rash, itching, dry skin Psych: Denies depression, anxiety, memory loss, and confusion Heme: Denies bruising, bleeding, and enlarged lymph nodes.   Physical Exam   BP (!) 151/86   Pulse 88   Temp 97.8 F (36.6 C)   Ht 5\' 5"  (1.651 m)   Wt 180 lb 6.4 oz (81.8 kg)   BMI 30.02 kg/m  General:   Alert and oriented. Pleasant and cooperative. Well-nourished and well-developed.  Head:  Normocephalic and atraumatic. Eyes:  Without icterus Abdomen:  +BS, soft, non-tender and non-distended. No HSM noted. No guarding or rebound. No masses appreciated.  Rectal:  Deferred  Msk:  Symmetrical without gross deformities. Normal posture. Extremities:  Without edema. Neurologic:  Alert and  oriented x4;  grossly normal neurologically. Skin:  Intact without significant lesions or rashes. Psych:  Alert and cooperative. Normal mood and affect.   Assessment   Sarah Chapman is a 61 y.o. female presenting today with a history of  constipation and GERD for one year follow-up.   Constipation: doing well with dietary changes and increased fiber. Rarely using laxatives. Much improved.  GERD:  continue Dexilant. No alarm signs/symptoms  RLQ discomfort: noted to be fleeting and if eating too much. Benign abdominal exam. No other associated signs/symptoms. No alarm signs/symptoms. Monitor for now and call if persistent.    PLAN   Continue Dexilant daily Colonoscopy 2026 Can return in 2 year Call if needed to be seen sooner   Gelene Mink, PhD, [facility]

## 2023-03-13 NOTE — Patient Instructions (Signed)
I am glad you are doing well!  We can see you back in 2 years, unless you need to be seen sooner.  I have sent in refills!  Your next colonoscopy is due in 2026!  I enjoyed seeing you again today! I value our relationship and want to provide genuine, compassionate, and quality care. You may receive a survey regarding your visit with me, and I welcome your feedback! Thanks so much for taking the time to complete this. I look forward to seeing you again.      Gelene Mink, PhD, ANP-BC [facility]

## 2023-04-02 DIAGNOSIS — M50223 Other cervical disc displacement at C6-C7 level: Secondary | ICD-10-CM | POA: Diagnosis not present

## 2023-04-02 DIAGNOSIS — M5021 Other cervical disc displacement,  high cervical region: Secondary | ICD-10-CM | POA: Diagnosis not present

## 2023-04-02 DIAGNOSIS — M47812 Spondylosis without myelopathy or radiculopathy, cervical region: Secondary | ICD-10-CM | POA: Diagnosis not present

## 2023-04-02 DIAGNOSIS — M4802 Spinal stenosis, cervical region: Secondary | ICD-10-CM | POA: Diagnosis not present

## 2023-04-09 DIAGNOSIS — M25569 Pain in unspecified knee: Secondary | ICD-10-CM | POA: Diagnosis not present

## 2023-04-09 DIAGNOSIS — M25529 Pain in unspecified elbow: Secondary | ICD-10-CM | POA: Diagnosis not present

## 2023-04-09 DIAGNOSIS — Z79891 Long term (current) use of opiate analgesic: Secondary | ICD-10-CM | POA: Diagnosis not present

## 2023-04-09 DIAGNOSIS — M47814 Spondylosis without myelopathy or radiculopathy, thoracic region: Secondary | ICD-10-CM | POA: Diagnosis not present

## 2023-04-09 DIAGNOSIS — G894 Chronic pain syndrome: Secondary | ICD-10-CM | POA: Diagnosis not present

## 2023-04-09 DIAGNOSIS — M47812 Spondylosis without myelopathy or radiculopathy, cervical region: Secondary | ICD-10-CM | POA: Diagnosis not present

## 2023-04-09 DIAGNOSIS — M5134 Other intervertebral disc degeneration, thoracic region: Secondary | ICD-10-CM | POA: Diagnosis not present

## 2023-04-09 DIAGNOSIS — M546 Pain in thoracic spine: Secondary | ICD-10-CM | POA: Diagnosis not present

## 2023-04-09 DIAGNOSIS — M797 Fibromyalgia: Secondary | ICD-10-CM | POA: Diagnosis not present

## 2023-04-09 DIAGNOSIS — M51361 Other intervertebral disc degeneration, lumbar region with lower extremity pain only: Secondary | ICD-10-CM | POA: Diagnosis not present

## 2023-04-09 DIAGNOSIS — M961 Postlaminectomy syndrome, not elsewhere classified: Secondary | ICD-10-CM | POA: Diagnosis not present

## 2023-04-09 DIAGNOSIS — Z79899 Other long term (current) drug therapy: Secondary | ICD-10-CM | POA: Diagnosis not present

## 2023-04-10 ENCOUNTER — Ambulatory Visit: Payer: Medicare HMO | Admitting: Adult Health

## 2023-04-10 ENCOUNTER — Encounter: Payer: Self-pay | Admitting: Adult Health

## 2023-04-10 ENCOUNTER — Ambulatory Visit: Payer: Medicare HMO | Admitting: Pulmonary Disease

## 2023-04-10 ENCOUNTER — Encounter: Payer: Self-pay | Admitting: Pulmonary Disease

## 2023-04-10 VITALS — BP 138/82 | HR 88 | Temp 98.1°F | Ht 65.0 in | Wt 182.4 lb

## 2023-04-10 DIAGNOSIS — G4733 Obstructive sleep apnea (adult) (pediatric): Secondary | ICD-10-CM | POA: Diagnosis not present

## 2023-04-10 DIAGNOSIS — J432 Centrilobular emphysema: Secondary | ICD-10-CM | POA: Diagnosis not present

## 2023-04-10 NOTE — Assessment & Plan Note (Signed)
Mild COPD with emphysema currently compensated on present regimen-continue on Trelegy  Continue with yearly LDCT   Plan  Patient Instructions  CPAP download requested  Continue on CPAP At bedtime , wear all night long .  Work on healthy weight Do not drive if sleepy.   Continue on Trelegy 1 puff daily, rinse after use.  Albuterol inhaler or Duoneb As needed   Continue with yearly CT chest scan.   Follow up with Dr. Isaiah Serge in 1 year and As needed.

## 2023-04-10 NOTE — Progress Notes (Signed)
@Patient  ID: Sarah Chapman, female    DOB: 04/07/1961, 62 y.o.   MRN: [id number]  Chief Complaint  Patient presents with   Follow-up    Referring provider: Mechele Claude, MD  HPI: 62 year old female former smoker followed for COPD and sleep apnea  TEST/EVENTS :   Imaging:  Chest x-ray 08/25/19-linear atelectasis in the right middle lobe.  Screening CT chest 09/11/2019-moderate emphysema. Screening CT chest 02/14/2021-moderate emphysema, bibasal cylindrical bronchiectasis and volume loss I have reviewed the images personally   PFTs: 04/16/15 Mild to moderate obstruction with reduction in expiratory flow post inhaled bronchodilator. Normal lung volumes and diffusing capacity.   09/04/19 FVC 3.24 [91%], FEV1 2.48 [91%] F/F 77, DLCO 15.82 [73%] Minimal obstructive airway disease with minimal restriction or diffusion defect.   Labs: A1AT 08/22/16- 137, PIMM   CBC 02/07/2022-WBC 11.3, eos 1%, absolute eosinophil count 113 IgE 06/22/2020-246   Sleep: Sleep study 03/02/15 RDI 24.9 Sat 87% Average Sat 91%    Cardiac: Echocardiogram 09/30/2019-LVEF 60-65%, RV systolic function is normal  04/10/2023 Follow up : COPD and OSA  Patient presents for a follow-up for COPD and sleep apnea.  Last seen February 2023.  She is followed for mild COPD with emphysema. Remains on Trelegy daily.  Says overall breathing is doing okay.  Denies any wheezing or increased cough.  Participates in the lung cancer CT screening program.  CT chest July 20, 2022 showed no suspicious pulmonary nodules.  Mild emphysema.  Has a planned CT chest in 1 year in February 2025.  Has OSA on CPAP At bedtime. Does not like it but she wears it. Was previously on BIPAP feels it worked better for controlling her sleep apnea. DME is [employer].  Uses a full face mask. Download has been requested.  Has hypersomnia take armodafinil daily, managed by Psych. Says it helps some.     Allergies  Allergen Reactions    Clindamycin/Lincomycin Rash   Penicillins     Unknown     Immunization History  Administered Date(s) Administered   Moderna Sars-Covid-2 Vaccination 08/25/2019, 09/22/2019   PFIZER Comirnaty(Gray Top)Covid-19 Tri-Sucrose Vaccine 03/02/2020   Tdap 06/10/2007, 04/03/2014   Zoster Recombinant(Shingrix) 07/08/2020, 09/16/2020    Past Medical History:  Diagnosis Date   Allergic rhinitis    Back pain    Chronic headache    COPD (chronic obstructive pulmonary disease) (HCC)    Emphysema of lung (HCC)    Hyperlipidemia    Hypertension    Neck pain    Sleep apnea    Vaginal Pap smear, abnormal     Tobacco History: Social History   Tobacco Use  Smoking Status Former   Current packs/day: 0.00   Average packs/day: 1 pack/day for 33.0 years (33.0 ttl pk-yrs)   Types: Cigarettes   Start date: 07/26/1983   Quit date: 07/25/2016   Years since quitting: 6.7  Smokeless Tobacco Never   Counseling given: Not Answered   Outpatient Medications Prior to Visit  Medication Sig Dispense Refill   albuterol (VENTOLIN HFA) 108 (90 Base) MCG/ACT inhaler Inhale 2 puffs into the lungs every 4 (four) hours as needed for wheezing or shortness of breath. 18 g 11   ALPRAZolam (XANAX) 0.5 MG tablet Take 0.25-0.5 mg by mouth daily as needed.     amLODipine (NORVASC) 5 MG tablet Take 1 tablet (5 mg total) by mouth daily. For blood pressure 30 tablet 5   Armodafinil 250 MG tablet Take 250 mg by mouth daily.  aspirin 81 MG EC tablet Take 1 tablet by mouth daily.     atorvastatin (LIPITOR) 80 MG tablet Take 1 tablet (80 mg total) by mouth daily. 90 tablet 3   buPROPion HCl ER, XL, 450 MG TB24 Take 450 mg by mouth daily.     dexlansoprazole (DEXILANT) 60 MG capsule Take 1 capsule (60 mg total) by mouth daily. 90 capsule 3   ezetimibe (ZETIA) 10 MG tablet TAKE ONE TABLET DAILY 90 tablet 3   HYDROcodone-acetaminophen (NORCO) 10-325 MG tablet Take 1 tablet by mouth 3 (three) times daily as needed.      ipratropium-albuterol (DUONEB) 0.5-2.5 (3) MG/3ML SOLN Take 3 mLs by nebulization every 6 (six) hours as needed. 360 mL 3   Loratadine 10 MG CAPS 1 Once A Day Oral     naloxone (NARCAN) nasal spray 4 mg/0.1 mL      Nebulizers (HOMENEB WITH SIDESTREAM) MISC See admin instructions.     nortriptyline (PAMELOR) 75 MG capsule Take 75 mg by mouth at bedtime.     olmesartan (BENICAR) 20 MG tablet Take 1 tablet (20 mg total) by mouth daily. 90 tablet 1   REPATHA SURECLICK 140 MG/ML SOAJ Inject 1 mL into the skin every 14 (fourteen) days.     tiZANidine (ZANAFLEX) 4 MG tablet Take 0.5-1 tablets (2-4 mg total) by mouth at bedtime. 30 tablet 5   TRELEGY ELLIPTA 200-62.5-25 MCG/ACT AEPB INHALE 1 PUFF ONCE DAILY 60 each 2   No facility-administered medications prior to visit.     Review of Systems:   Constitutional:   No  weight loss, night sweats,  Fevers, chills, +fatigue, or  lassitude.  HEENT:   No headaches,  Difficulty swallowing,  Tooth/dental problems, or  Sore throat,                No sneezing, itching, ear ache, nasal congestion, post nasal drip,   CV:  No chest pain,  Orthopnea, PND, swelling in lower extremities, anasarca, dizziness, palpitations, syncope.   GI  No heartburn, indigestion, abdominal pain, nausea, vomiting, diarrhea, change in bowel habits, loss of appetite, bloody stools.   Resp:  No chest wall deformity  Skin: no rash or lesions.  GU: no dysuria, change in color of urine, no urgency or frequency.  No flank pain, no hematuria   MS:  No joint pain or swelling.  No decreased range of motion.  No back pain.    Physical Exam  BP 138/82 (BP Location: Left Arm, Patient Position: Sitting, Cuff Size: Normal)   Pulse 88   Temp 98.1 F (36.7 C) (Oral)   Ht 5\' 5"  (1.651 m)   Wt 182 lb 6.4 oz (82.7 kg)   SpO2 99%   BMI 30.35 kg/m   GEN: A/Ox3; pleasant , NAD, well nourished    HEENT:  [place]/AT,  NOSE-clear, THROAT-clear, no lesions, no postnasal drip or exudate  noted.   NECK:  Supple w/ fair ROM; no JVD; normal carotid impulses w/o bruits; no thyromegaly or nodules palpated; no lymphadenopathy.    RESP  Clear  P & A; w/o, wheezes/ rales/ or rhonchi. no accessory muscle use, no dullness to percussion  CARD:  RRR, no m/r/g, no peripheral edema, pulses intact, no cyanosis or clubbing.  GI:   Soft & nt; nml bowel sounds; no organomegaly or masses detected.   Musco: Warm bil, no deformities or joint swelling noted.   Neuro: alert, no focal deficits noted.    Skin: Warm, no lesions or  rashes    Lab Results:  CBC   BMET   BNP No results found for: "BNP"  ProBNP No results found for: "PROBNP"  Imaging: No results found.  Administration History     None          Latest Ref Rng & Units 09/04/2019   12:57 PM 09/04/2019   11:57 AM  PFT Results  FVC-Pre L  3.18   FVC-Predicted Pre % 90  P 90   FVC-Post L 3.24  P 3.24   FVC-Predicted Post % 91  P 91   Pre FEV1/FVC % % 73  P 73   Post FEV1/FCV % % 77  P 77   FEV1-Pre L 2.33  P 2.33   FEV1-Predicted Pre % 85  P 85   FEV1-Post L 2.48  P 2.48   DLCO uncorrected ml/min/mmHg 15.82  P 15.82   DLCO UNC% % 73  P 73   DLCO corrected ml/min/mmHg 15.82  P 15.82   DLCO COR %Predicted % 73  P 73   DLVA Predicted % 73  P 73     P Preliminary result    No results found for: "NITRICOXIDE"      Assessment & Plan:   Centrilobular emphysema (HCC) Mild COPD with emphysema currently compensated on present regimen-continue on Trelegy  Continue with yearly LDCT   Plan  Patient Instructions  CPAP download requested  Continue on CPAP At bedtime , wear all night long .  Work on healthy weight Do not drive if sleepy.   Continue on Trelegy 1 puff daily, rinse after use.  Albuterol inhaler or Duoneb As needed   Continue with yearly CT chest scan.   Follow up with Dr. Isaiah Serge in 1 year and As needed.           OSA (obstructive sleep apnea) Moderate obstructive sleep apnea on  nocturnal CPAP.   Will check CPAP download.  For now continue on CPAP at bedtime once download is available decide on next step.    Plan Patient Instructions  CPAP download requested  Continue on CPAP At bedtime , wear all night long .  Work on healthy weight Do not drive if sleepy.   Continue on Trelegy 1 puff daily, rinse after use.  Albuterol inhaler or Duoneb As needed   Continue with yearly CT chest scan.   Follow up with Dr. Isaiah Serge in 1 year and As needed.             Rubye Oaks, NP 04/10/2023

## 2023-04-10 NOTE — Assessment & Plan Note (Signed)
Moderate obstructive sleep apnea on nocturnal CPAP.   Will check CPAP download.  For now continue on CPAP at bedtime once download is available decide on next step.    Plan Patient Instructions  CPAP download requested  Continue on CPAP At bedtime , wear all night long .  Work on healthy weight Do not drive if sleepy.   Continue on Trelegy 1 puff daily, rinse after use.  Albuterol inhaler or Duoneb As needed   Continue with yearly CT chest scan.   Follow up with Dr. Isaiah Serge in 1 year and As needed.

## 2023-04-10 NOTE — Patient Instructions (Addendum)
CPAP download requested  Continue on CPAP At bedtime , wear all night long .  Work on healthy weight Do not drive if sleepy.   Continue on Trelegy 1 puff daily, rinse after use.  Albuterol inhaler or Duoneb As needed   Continue with yearly CT chest scan.   Follow up with Dr. Isaiah Serge in 1 year and As needed.

## 2023-04-11 ENCOUNTER — Ambulatory Visit: Payer: Medicare HMO | Admitting: Family Medicine

## 2023-04-11 ENCOUNTER — Encounter: Payer: Self-pay | Admitting: Family Medicine

## 2023-04-11 VITALS — BP 135/80 | HR 86 | Temp 97.4°F | Ht 65.0 in | Wt 182.0 lb

## 2023-04-11 DIAGNOSIS — E785 Hyperlipidemia, unspecified: Secondary | ICD-10-CM

## 2023-04-11 DIAGNOSIS — K219 Gastro-esophageal reflux disease without esophagitis: Secondary | ICD-10-CM | POA: Diagnosis not present

## 2023-04-11 DIAGNOSIS — I10 Essential (primary) hypertension: Secondary | ICD-10-CM

## 2023-04-11 DIAGNOSIS — J432 Centrilobular emphysema: Secondary | ICD-10-CM | POA: Diagnosis not present

## 2023-04-11 MED ORDER — PREDNISONE 10 MG PO TABS: ORAL_TABLET | ORAL | 0 refills | Status: DC

## 2023-04-11 NOTE — Progress Notes (Signed)
Subjective:  Patient ID: Sarah Chapman, female    DOB: August 19, 1960  Age: 62 y.o. MRN: [id number]  CC: Follow-up   HPI Welma Crile Tersigni presents for  presents for  follow-up of hypertension. Patient has no history of headache chest pain or shortness of breath or recent cough. Patient also denies symptoms of TIA such as focal numbness or weakness. Patient denies side effects from medication. States taking it regularly.  Patient in for follow-up of GERD. Currently asymptomatic taking  PPI daily. There is no chest pain or heartburn. No hematemesis and no melena. No dysphagia or choking. Onset is remote. Progression is stable. Complicating factors, none.   in for follow-up of elevated cholesterol. Doing well without complaints on current medication. Denies side effects of statin including myalgia and arthralgia and nausea. Currently no chest pain, shortness of breath or other cardiovascular related symptoms noted. Also Taking repatha.   COPD trestedby pulmonary. Has trelegy, duonebs for tx.      04/11/2023    1:11 PM 04/11/2023    1:05 PM 02/06/2023   10:15 AM  Depression screen PHQ 2/9  Decreased Interest 3 0 2  Down, Depressed, Hopeless 2 0 2  PHQ - 2 Score 5 0 4  Altered sleeping 1  2  Tired, decreased energy 3  3  Change in appetite 2  2  Feeling bad or failure about yourself  0  2  Trouble concentrating 1  3  Moving slowly or fidgety/restless 2  1  Suicidal thoughts 0  0  PHQ-9 Score 14  17  Difficult doing work/chores Extremely dIfficult  Very difficult    History Lauralyn has a past medical history of Allergic rhinitis, Back pain, Chronic headache, COPD (chronic obstructive pulmonary disease) (HCC), Emphysema of lung (HCC), Hyperlipidemia, Hypertension, Neck pain, Sleep apnea, and Vaginal Pap smear, abnormal.   She has a past surgical history that includes Neck surgery; Tonsillectomy; Cesarean section; Gynecologic cryosurgery; and Colonoscopy (N/A, 01/07/2015).   Her family history  includes Bone cancer in her mother; Cancer in her maternal grandmother; Heart attack in her brother; Mental illness in her son.She reports that she quit smoking about 6 years ago. Her smoking use included cigarettes. She started smoking about 39 years ago. She has a 33 pack-year smoking history. She has never used smokeless tobacco. She reports that she does not drink alcohol and does not use drugs.    ROS Review of Systems  Constitutional: Negative.   HENT: Negative.    Eyes:  Negative for visual disturbance.  Respiratory:  Negative for shortness of breath.   Cardiovascular:  Negative for chest pain.  Gastrointestinal:  Negative for abdominal pain.  Musculoskeletal:  Negative for arthralgias.    Objective:  BP 135/80   Pulse 86   Temp (!) 97.4 F (36.3 C)   Ht 5\' 5"  (1.651 m)   Wt 182 lb (82.6 kg)   SpO2 96%   BMI 30.29 kg/m   BP Readings from Last 3 Encounters:  04/11/23 135/80  04/10/23 138/82  03/13/23 (!) 151/86    Wt Readings from Last 3 Encounters:  04/11/23 182 lb (82.6 kg)  04/10/23 182 lb 6.4 oz (82.7 kg)  03/13/23 180 lb 6.4 oz (81.8 kg)     Physical Exam Constitutional:      General: She is not in acute distress.    Appearance: She is well-developed.  Cardiovascular:     Rate and Rhythm: Normal rate and regular rhythm.  Pulmonary:  Breath sounds: Normal breath sounds.  Musculoskeletal:        General: Normal range of motion.  Skin:    General: Skin is warm and dry.  Neurological:     Mental Status: She is alert and oriented to person, place, and time.       Assessment & Plan:   Layann was seen today for follow-up.  Diagnoses and all orders for this visit:  Centrilobular emphysema (HCC) -     CBC with Differential/Platelet -     CMP14+EGFR  Essential hypertension -     CBC with Differential/Platelet -     CMP14+EGFR -     Lipid panel  Gastroesophageal reflux disease without esophagitis -     CBC with Differential/Platelet -      CMP14+EGFR  Hyperlipidemia, unspecified hyperlipidemia type -     CBC with Differential/Platelet -     CMP14+EGFR -     Lipid panel  Other orders -     predniSONE (DELTASONE) 10 MG tablet; Take 5 daily for 3 days followed by 4,3,2 and 1 for 3 days each.       I am having Joda C. Kryder start on predniSONE. I am also having her maintain her aspirin EC, buPROPion HCl ER (XL), nortriptyline, Armodafinil, HomeNeb with Sidestream, Loratadine, naloxone, ALPRAZolam, atorvastatin, Repatha SureClick, albuterol, tiZANidine, HYDROcodone-acetaminophen, ipratropium-albuterol, olmesartan, amLODipine, Trelegy Ellipta, ezetimibe, and dexlansoprazole.  Allergies as of 04/11/2023       Reactions   Clindamycin/lincomycin Rash   Penicillins    Unknown        Medication List        Accurate as of April 11, 2023 11:59 PM. If you have any questions, ask your nurse or doctor.          albuterol 108 (90 Base) MCG/ACT inhaler Commonly known as: VENTOLIN HFA Inhale 2 puffs into the lungs every 4 (four) hours as needed for wheezing or shortness of breath.   ALPRAZolam 0.5 MG tablet Commonly known as: XANAX Take 0.25-0.5 mg by mouth daily as needed.   amLODipine 5 MG tablet Commonly known as: NORVASC Take 1 tablet (5 mg total) by mouth daily. For blood pressure   Armodafinil 250 MG tablet Take 250 mg by mouth daily.   aspirin EC 81 MG tablet Take 1 tablet by mouth daily.   atorvastatin 80 MG tablet Commonly known as: LIPITOR Take 1 tablet (80 mg total) by mouth daily.   buPROPion HCl ER (XL) 450 MG Tb24 Take 450 mg by mouth daily.   dexlansoprazole 60 MG capsule Commonly known as: DEXILANT Take 1 capsule (60 mg total) by mouth daily.   ezetimibe 10 MG tablet Commonly known as: ZETIA TAKE ONE TABLET DAILY   HomeNeb with Sidestream Misc See admin instructions.   HYDROcodone-acetaminophen 10-325 MG tablet Commonly known as: NORCO Take 1 tablet by mouth 3 (three) times  daily as needed.   ipratropium-albuterol 0.5-2.5 (3) MG/3ML Soln Commonly known as: DUONEB Take 3 mLs by nebulization every 6 (six) hours as needed.   Loratadine 10 MG Caps 1 Once A Day Oral   naloxone 4 MG/0.1ML Liqd nasal spray kit Commonly known as: NARCAN   nortriptyline 75 MG capsule Commonly known as: PAMELOR Take 75 mg by mouth at bedtime.   olmesartan 20 MG tablet Commonly known as: BENICAR Take 1 tablet (20 mg total) by mouth daily.   predniSONE 10 MG tablet Commonly known as: DELTASONE Take 5 daily for 3 days followed by 4,3,2 and  1 for 3 days each. Started by: Odean Mcelwain   Repatha SureClick 140 MG/ML Soaj Generic drug: Evolocumab Inject 1 mL into the skin every 14 (fourteen) days.   tiZANidine 4 MG tablet Commonly known as: ZANAFLEX Take 0.5-1 tablets (2-4 mg total) by mouth at bedtime.   Trelegy Ellipta 200-62.5-25 MCG/ACT Aepb Generic drug: Fluticasone-Umeclidin-Vilant INHALE 1 PUFF ONCE DAILY         Follow-up: Return in about 6 months (around 10/09/2023).  Mechele Claude, M.D.

## 2023-04-12 LAB — CMP14+EGFR
ALT: 53 IU/L — ABNORMAL HIGH (ref 0–32)
AST: 33 IU/L (ref 0–40)
Albumin: 4.4 g/dL (ref 3.9–4.9)
Alkaline Phosphatase: 158 IU/L — ABNORMAL HIGH (ref 44–121)
BUN/Creatinine Ratio: 9 — ABNORMAL LOW (ref 12–28)
BUN: 8 mg/dL (ref 8–27)
Bilirubin Total: 0.2 mg/dL (ref 0.0–1.2)
CO2: 23 mmol/L (ref 20–29)
Calcium: 9.8 mg/dL (ref 8.7–10.3)
Chloride: 100 mmol/L (ref 96–106)
Creatinine, Ser: 0.88 mg/dL (ref 0.57–1.00)
Globulin, Total: 2.6 g/dL (ref 1.5–4.5)
Glucose: 81 mg/dL (ref 70–99)
Potassium: 4.5 mmol/L (ref 3.5–5.2)
Sodium: 140 mmol/L (ref 134–144)
Total Protein: 7 g/dL (ref 6.0–8.5)
eGFR: 74 mL/min/{1.73_m2} (ref >59)

## 2023-04-12 LAB — CBC WITH DIFFERENTIAL/PLATELET
Basophils Absolute: 0.1 10*3/uL (ref 0.0–0.2)
Basos: 1 %
EOS (ABSOLUTE): 0.1 10*3/uL (ref 0.0–0.4)
Eos: 2 %
Hematocrit: 41 % (ref 34.0–46.6)
Hemoglobin: 13.2 g/dL (ref 11.1–15.9)
Immature Grans (Abs): 0 10*3/uL (ref 0.0–0.1)
Immature Granulocytes: 0 %
Lymphocytes Absolute: 2.4 10*3/uL (ref 0.7–3.1)
Lymphs: 39 %
MCH: 30.1 pg (ref 26.6–33.0)
MCHC: 32.2 g/dL (ref 31.5–35.7)
MCV: 93 fL (ref 79–97)
Monocytes Absolute: 0.6 10*3/uL (ref 0.1–0.9)
Monocytes: 10 %
Neutrophils Absolute: 3 10*3/uL (ref 1.4–7.0)
Neutrophils: 48 %
Platelets: 375 10*3/uL (ref 150–450)
RBC: 4.39 x10E6/uL (ref 3.77–5.28)
RDW: 15.4 % (ref 11.7–15.4)
WBC: 6.2 10*3/uL (ref 3.4–10.8)

## 2023-04-12 LAB — LIPID PANEL
Chol/HDL Ratio: 1.7 ratio (ref 0.0–4.4)
Cholesterol, Total: 158 mg/dL (ref 100–199)
HDL: 93 mg/dL (ref >39)
LDL Chol Calc (NIH): 45 mg/dL (ref 0–99)
Triglycerides: 118 mg/dL (ref 0–149)
VLDL Cholesterol Cal: 20 mg/dL (ref 5–40)

## 2023-04-13 NOTE — Progress Notes (Signed)
Hello Darleene,  Your lab result is normal and/or stable.Some minor variations that are not significant are commonly marked abnormal, but do not represent any medical problem for you.  Best regards, Nahlia Hellmann, M.D.

## 2023-04-16 ENCOUNTER — Encounter: Payer: Self-pay | Admitting: Family Medicine

## 2023-04-19 ENCOUNTER — Telehealth: Payer: Self-pay | Admitting: *Deleted

## 2023-04-19 NOTE — Telephone Encounter (Signed)
Results not available yet.

## 2023-04-23 NOTE — Telephone Encounter (Signed)
Severe arthritis changes that are likely painful. No herniated disc. She should follow up with the ordering physician for further correlation of the results with her symptoms.

## 2023-04-23 NOTE — Telephone Encounter (Signed)
PLEASE REVIEW MRI C SPINE IN CARE EVERYWHERE AND ADVISE

## 2023-04-24 NOTE — Telephone Encounter (Signed)
PATIENT AWARE

## 2023-05-02 ENCOUNTER — Other Ambulatory Visit: Payer: Self-pay | Admitting: Cardiology

## 2023-05-02 DIAGNOSIS — I2510 Atherosclerotic heart disease of native coronary artery without angina pectoris: Secondary | ICD-10-CM

## 2023-05-02 DIAGNOSIS — E785 Hyperlipidemia, unspecified: Secondary | ICD-10-CM

## 2023-05-03 DIAGNOSIS — G4734 Idiopathic sleep related nonobstructive alveolar hypoventilation: Secondary | ICD-10-CM | POA: Diagnosis not present

## 2023-05-03 DIAGNOSIS — Z9989 Dependence on other enabling machines and devices: Secondary | ICD-10-CM | POA: Diagnosis not present

## 2023-05-03 DIAGNOSIS — Z8659 Personal history of other mental and behavioral disorders: Secondary | ICD-10-CM | POA: Diagnosis not present

## 2023-05-03 DIAGNOSIS — J432 Centrilobular emphysema: Secondary | ICD-10-CM | POA: Diagnosis not present

## 2023-05-03 DIAGNOSIS — G4731 Primary central sleep apnea: Secondary | ICD-10-CM | POA: Diagnosis not present

## 2023-05-03 DIAGNOSIS — G478 Other sleep disorders: Secondary | ICD-10-CM | POA: Diagnosis not present

## 2023-05-03 DIAGNOSIS — R5383 Other fatigue: Secondary | ICD-10-CM | POA: Diagnosis not present

## 2023-05-07 DIAGNOSIS — M47812 Spondylosis without myelopathy or radiculopathy, cervical region: Secondary | ICD-10-CM | POA: Diagnosis not present

## 2023-05-07 DIAGNOSIS — M797 Fibromyalgia: Secondary | ICD-10-CM | POA: Diagnosis not present

## 2023-05-07 DIAGNOSIS — M51361 Other intervertebral disc degeneration, lumbar region with lower extremity pain only: Secondary | ICD-10-CM | POA: Diagnosis not present

## 2023-05-07 DIAGNOSIS — G894 Chronic pain syndrome: Secondary | ICD-10-CM | POA: Diagnosis not present

## 2023-05-07 DIAGNOSIS — M25529 Pain in unspecified elbow: Secondary | ICD-10-CM | POA: Diagnosis not present

## 2023-05-07 DIAGNOSIS — Z79891 Long term (current) use of opiate analgesic: Secondary | ICD-10-CM | POA: Diagnosis not present

## 2023-05-07 DIAGNOSIS — Z79899 Other long term (current) drug therapy: Secondary | ICD-10-CM | POA: Diagnosis not present

## 2023-05-07 DIAGNOSIS — M5134 Other intervertebral disc degeneration, thoracic region: Secondary | ICD-10-CM | POA: Diagnosis not present

## 2023-05-07 DIAGNOSIS — M25569 Pain in unspecified knee: Secondary | ICD-10-CM | POA: Diagnosis not present

## 2023-05-07 DIAGNOSIS — M961 Postlaminectomy syndrome, not elsewhere classified: Secondary | ICD-10-CM | POA: Diagnosis not present

## 2023-05-07 DIAGNOSIS — M47814 Spondylosis without myelopathy or radiculopathy, thoracic region: Secondary | ICD-10-CM | POA: Diagnosis not present

## 2023-05-07 DIAGNOSIS — M546 Pain in thoracic spine: Secondary | ICD-10-CM | POA: Diagnosis not present

## 2023-05-10 DIAGNOSIS — H40023 Open angle with borderline findings, high risk, bilateral: Secondary | ICD-10-CM | POA: Diagnosis not present

## 2023-05-10 DIAGNOSIS — H2513 Age-related nuclear cataract, bilateral: Secondary | ICD-10-CM | POA: Diagnosis not present

## 2023-05-10 DIAGNOSIS — H524 Presbyopia: Secondary | ICD-10-CM | POA: Diagnosis not present

## 2023-05-10 DIAGNOSIS — H52223 Regular astigmatism, bilateral: Secondary | ICD-10-CM | POA: Diagnosis not present

## 2023-05-10 DIAGNOSIS — H5213 Myopia, bilateral: Secondary | ICD-10-CM | POA: Diagnosis not present

## 2023-05-14 DIAGNOSIS — F339 Major depressive disorder, recurrent, unspecified: Secondary | ICD-10-CM | POA: Diagnosis not present

## 2023-05-14 DIAGNOSIS — F411 Generalized anxiety disorder: Secondary | ICD-10-CM | POA: Diagnosis not present

## 2023-05-14 DIAGNOSIS — F900 Attention-deficit hyperactivity disorder, predominantly inattentive type: Secondary | ICD-10-CM | POA: Diagnosis not present

## 2023-05-14 DIAGNOSIS — G8921 Chronic pain due to trauma: Secondary | ICD-10-CM | POA: Diagnosis not present

## 2023-05-17 ENCOUNTER — Other Ambulatory Visit: Payer: Self-pay | Admitting: Acute Care

## 2023-05-17 DIAGNOSIS — Z122 Encounter for screening for malignant neoplasm of respiratory organs: Secondary | ICD-10-CM

## 2023-05-17 DIAGNOSIS — Z87891 Personal history of nicotine dependence: Secondary | ICD-10-CM

## 2023-05-20 DIAGNOSIS — G478 Other sleep disorders: Secondary | ICD-10-CM | POA: Diagnosis not present

## 2023-05-20 DIAGNOSIS — G4731 Primary central sleep apnea: Secondary | ICD-10-CM | POA: Diagnosis not present

## 2023-05-20 DIAGNOSIS — Z9989 Dependence on other enabling machines and devices: Secondary | ICD-10-CM | POA: Diagnosis not present

## 2023-05-20 DIAGNOSIS — R5383 Other fatigue: Secondary | ICD-10-CM | POA: Diagnosis not present

## 2023-05-20 DIAGNOSIS — J432 Centrilobular emphysema: Secondary | ICD-10-CM | POA: Diagnosis not present

## 2023-05-20 DIAGNOSIS — G4734 Idiopathic sleep related nonobstructive alveolar hypoventilation: Secondary | ICD-10-CM | POA: Diagnosis not present

## 2023-05-20 DIAGNOSIS — Z8659 Personal history of other mental and behavioral disorders: Secondary | ICD-10-CM | POA: Diagnosis not present

## 2023-05-20 DIAGNOSIS — G4733 Obstructive sleep apnea (adult) (pediatric): Secondary | ICD-10-CM | POA: Diagnosis not present

## 2023-05-25 DIAGNOSIS — H52223 Regular astigmatism, bilateral: Secondary | ICD-10-CM | POA: Diagnosis not present

## 2023-05-25 DIAGNOSIS — H5213 Myopia, bilateral: Secondary | ICD-10-CM | POA: Diagnosis not present

## 2023-06-06 DIAGNOSIS — M5134 Other intervertebral disc degeneration, thoracic region: Secondary | ICD-10-CM | POA: Diagnosis not present

## 2023-06-06 DIAGNOSIS — M5459 Other low back pain: Secondary | ICD-10-CM | POA: Diagnosis not present

## 2023-06-06 DIAGNOSIS — Z79891 Long term (current) use of opiate analgesic: Secondary | ICD-10-CM | POA: Diagnosis not present

## 2023-06-06 DIAGNOSIS — M47814 Spondylosis without myelopathy or radiculopathy, thoracic region: Secondary | ICD-10-CM | POA: Diagnosis not present

## 2023-06-06 DIAGNOSIS — Z79899 Other long term (current) drug therapy: Secondary | ICD-10-CM | POA: Diagnosis not present

## 2023-06-06 DIAGNOSIS — M25529 Pain in unspecified elbow: Secondary | ICD-10-CM | POA: Diagnosis not present

## 2023-06-06 DIAGNOSIS — M797 Fibromyalgia: Secondary | ICD-10-CM | POA: Diagnosis not present

## 2023-06-06 DIAGNOSIS — M961 Postlaminectomy syndrome, not elsewhere classified: Secondary | ICD-10-CM | POA: Diagnosis not present

## 2023-06-06 DIAGNOSIS — M546 Pain in thoracic spine: Secondary | ICD-10-CM | POA: Diagnosis not present

## 2023-06-06 DIAGNOSIS — M25569 Pain in unspecified knee: Secondary | ICD-10-CM | POA: Diagnosis not present

## 2023-06-06 DIAGNOSIS — G894 Chronic pain syndrome: Secondary | ICD-10-CM | POA: Diagnosis not present

## 2023-06-06 DIAGNOSIS — M47812 Spondylosis without myelopathy or radiculopathy, cervical region: Secondary | ICD-10-CM | POA: Diagnosis not present

## 2023-06-13 DIAGNOSIS — M5412 Radiculopathy, cervical region: Secondary | ICD-10-CM | POA: Diagnosis not present

## 2023-06-21 DIAGNOSIS — M19011 Primary osteoarthritis, right shoulder: Secondary | ICD-10-CM | POA: Diagnosis not present

## 2023-06-21 DIAGNOSIS — M25511 Pain in right shoulder: Secondary | ICD-10-CM | POA: Diagnosis not present

## 2023-06-21 DIAGNOSIS — G8929 Other chronic pain: Secondary | ICD-10-CM | POA: Diagnosis not present

## 2023-06-22 DIAGNOSIS — H40013 Open angle with borderline findings, low risk, bilateral: Secondary | ICD-10-CM | POA: Diagnosis not present

## 2023-06-22 DIAGNOSIS — H16223 Keratoconjunctivitis sicca, not specified as Sjogren's, bilateral: Secondary | ICD-10-CM | POA: Diagnosis not present

## 2023-07-03 DIAGNOSIS — G4731 Primary central sleep apnea: Secondary | ICD-10-CM | POA: Diagnosis not present

## 2023-07-04 DIAGNOSIS — Z79891 Long term (current) use of opiate analgesic: Secondary | ICD-10-CM | POA: Diagnosis not present

## 2023-07-04 DIAGNOSIS — M797 Fibromyalgia: Secondary | ICD-10-CM | POA: Diagnosis not present

## 2023-07-04 DIAGNOSIS — M47812 Spondylosis without myelopathy or radiculopathy, cervical region: Secondary | ICD-10-CM | POA: Diagnosis not present

## 2023-07-04 DIAGNOSIS — Z79899 Other long term (current) drug therapy: Secondary | ICD-10-CM | POA: Diagnosis not present

## 2023-07-04 DIAGNOSIS — M546 Pain in thoracic spine: Secondary | ICD-10-CM | POA: Diagnosis not present

## 2023-07-04 DIAGNOSIS — M5459 Other low back pain: Secondary | ICD-10-CM | POA: Diagnosis not present

## 2023-07-04 DIAGNOSIS — G894 Chronic pain syndrome: Secondary | ICD-10-CM | POA: Diagnosis not present

## 2023-07-04 DIAGNOSIS — M47814 Spondylosis without myelopathy or radiculopathy, thoracic region: Secondary | ICD-10-CM | POA: Diagnosis not present

## 2023-07-04 DIAGNOSIS — M25569 Pain in unspecified knee: Secondary | ICD-10-CM | POA: Diagnosis not present

## 2023-07-04 DIAGNOSIS — M961 Postlaminectomy syndrome, not elsewhere classified: Secondary | ICD-10-CM | POA: Diagnosis not present

## 2023-07-04 DIAGNOSIS — M25529 Pain in unspecified elbow: Secondary | ICD-10-CM | POA: Diagnosis not present

## 2023-07-04 DIAGNOSIS — M5134 Other intervertebral disc degeneration, thoracic region: Secondary | ICD-10-CM | POA: Diagnosis not present

## 2023-07-05 ENCOUNTER — Ambulatory Visit: Payer: Medicare HMO | Admitting: Family Medicine

## 2023-07-07 ENCOUNTER — Other Ambulatory Visit: Payer: Self-pay | Admitting: Family Medicine

## 2023-07-16 DIAGNOSIS — F339 Major depressive disorder, recurrent, unspecified: Secondary | ICD-10-CM | POA: Diagnosis not present

## 2023-07-16 DIAGNOSIS — F900 Attention-deficit hyperactivity disorder, predominantly inattentive type: Secondary | ICD-10-CM | POA: Diagnosis not present

## 2023-07-16 DIAGNOSIS — G8921 Chronic pain due to trauma: Secondary | ICD-10-CM | POA: Diagnosis not present

## 2023-07-16 DIAGNOSIS — F411 Generalized anxiety disorder: Secondary | ICD-10-CM | POA: Diagnosis not present

## 2023-07-23 ENCOUNTER — Ambulatory Visit (HOSPITAL_COMMUNITY)
Admission: RE | Admit: 2023-07-23 | Discharge: 2023-07-23 | Disposition: A | Discharge status: Home / Self Care | Payer: Medicare HMO | Source: Ambulatory Visit | Attending: Family Medicine | Admitting: Family Medicine

## 2023-07-23 DIAGNOSIS — Z122 Encounter for screening for malignant neoplasm of respiratory organs: Secondary | ICD-10-CM | POA: Insufficient documentation

## 2023-07-23 DIAGNOSIS — Z87891 Personal history of nicotine dependence: Secondary | ICD-10-CM | POA: Insufficient documentation

## 2023-08-09 ENCOUNTER — Other Ambulatory Visit: Payer: Self-pay | Admitting: Family Medicine

## 2023-08-09 ENCOUNTER — Encounter: Payer: Self-pay | Admitting: Family Medicine

## 2023-08-09 DIAGNOSIS — G894 Chronic pain syndrome: Secondary | ICD-10-CM | POA: Diagnosis not present

## 2023-08-09 DIAGNOSIS — M546 Pain in thoracic spine: Secondary | ICD-10-CM | POA: Diagnosis not present

## 2023-08-09 DIAGNOSIS — M25569 Pain in unspecified knee: Secondary | ICD-10-CM | POA: Diagnosis not present

## 2023-08-09 DIAGNOSIS — Z79891 Long term (current) use of opiate analgesic: Secondary | ICD-10-CM | POA: Diagnosis not present

## 2023-08-09 DIAGNOSIS — M5134 Other intervertebral disc degeneration, thoracic region: Secondary | ICD-10-CM | POA: Diagnosis not present

## 2023-08-09 DIAGNOSIS — M797 Fibromyalgia: Secondary | ICD-10-CM | POA: Diagnosis not present

## 2023-08-09 DIAGNOSIS — M47812 Spondylosis without myelopathy or radiculopathy, cervical region: Secondary | ICD-10-CM | POA: Diagnosis not present

## 2023-08-09 DIAGNOSIS — M25511 Pain in right shoulder: Secondary | ICD-10-CM | POA: Diagnosis not present

## 2023-08-09 DIAGNOSIS — M961 Postlaminectomy syndrome, not elsewhere classified: Secondary | ICD-10-CM | POA: Diagnosis not present

## 2023-08-09 DIAGNOSIS — M47814 Spondylosis without myelopathy or radiculopathy, thoracic region: Secondary | ICD-10-CM | POA: Diagnosis not present

## 2023-08-09 DIAGNOSIS — M25529 Pain in unspecified elbow: Secondary | ICD-10-CM | POA: Diagnosis not present

## 2023-08-09 DIAGNOSIS — Z79899 Other long term (current) drug therapy: Secondary | ICD-10-CM | POA: Diagnosis not present

## 2023-08-09 NOTE — Telephone Encounter (Signed)
 Called to schedule appt no answer Letter mailed

## 2023-08-09 NOTE — Telephone Encounter (Signed)
 Stacks NTBS in May for 6 mos FU RF sent to pharmacy

## 2023-08-14 ENCOUNTER — Other Ambulatory Visit: Payer: Self-pay

## 2023-08-14 DIAGNOSIS — Z87891 Personal history of nicotine dependence: Secondary | ICD-10-CM

## 2023-08-14 DIAGNOSIS — Z122 Encounter for screening for malignant neoplasm of respiratory organs: Secondary | ICD-10-CM

## 2023-08-15 DIAGNOSIS — M5412 Radiculopathy, cervical region: Secondary | ICD-10-CM | POA: Diagnosis not present

## 2023-09-06 DIAGNOSIS — M25569 Pain in unspecified knee: Secondary | ICD-10-CM | POA: Diagnosis not present

## 2023-09-06 DIAGNOSIS — M25511 Pain in right shoulder: Secondary | ICD-10-CM | POA: Diagnosis not present

## 2023-09-06 DIAGNOSIS — M797 Fibromyalgia: Secondary | ICD-10-CM | POA: Diagnosis not present

## 2023-09-06 DIAGNOSIS — M5412 Radiculopathy, cervical region: Secondary | ICD-10-CM | POA: Diagnosis not present

## 2023-09-06 DIAGNOSIS — M791 Myalgia, unspecified site: Secondary | ICD-10-CM | POA: Diagnosis not present

## 2023-09-06 DIAGNOSIS — M961 Postlaminectomy syndrome, not elsewhere classified: Secondary | ICD-10-CM | POA: Diagnosis not present

## 2023-09-06 DIAGNOSIS — M546 Pain in thoracic spine: Secondary | ICD-10-CM | POA: Diagnosis not present

## 2023-09-06 DIAGNOSIS — M47812 Spondylosis without myelopathy or radiculopathy, cervical region: Secondary | ICD-10-CM | POA: Diagnosis not present

## 2023-09-06 DIAGNOSIS — M47814 Spondylosis without myelopathy or radiculopathy, thoracic region: Secondary | ICD-10-CM | POA: Diagnosis not present

## 2023-09-06 DIAGNOSIS — M25529 Pain in unspecified elbow: Secondary | ICD-10-CM | POA: Diagnosis not present

## 2023-09-06 DIAGNOSIS — M5134 Other intervertebral disc degeneration, thoracic region: Secondary | ICD-10-CM | POA: Diagnosis not present

## 2023-09-06 DIAGNOSIS — Z79891 Long term (current) use of opiate analgesic: Secondary | ICD-10-CM | POA: Diagnosis not present

## 2023-09-18 ENCOUNTER — Ambulatory Visit (INDEPENDENT_AMBULATORY_CARE_PROVIDER_SITE_OTHER): Admitting: Family Medicine

## 2023-09-18 ENCOUNTER — Encounter: Payer: Self-pay | Admitting: Family Medicine

## 2023-09-18 VITALS — BP 127/77 | HR 85 | Temp 98.5°F | Ht 65.0 in | Wt 185.0 lb

## 2023-09-18 DIAGNOSIS — J432 Centrilobular emphysema: Secondary | ICD-10-CM

## 2023-09-18 DIAGNOSIS — E611 Iron deficiency: Secondary | ICD-10-CM

## 2023-09-18 DIAGNOSIS — E785 Hyperlipidemia, unspecified: Secondary | ICD-10-CM

## 2023-09-18 DIAGNOSIS — I10 Essential (primary) hypertension: Secondary | ICD-10-CM

## 2023-09-18 DIAGNOSIS — K219 Gastro-esophageal reflux disease without esophagitis: Secondary | ICD-10-CM | POA: Diagnosis not present

## 2023-09-18 DIAGNOSIS — I700 Atherosclerosis of aorta: Secondary | ICD-10-CM | POA: Diagnosis not present

## 2023-09-18 LAB — LIPID PANEL

## 2023-09-18 MED ORDER — PREDNISONE 10 MG PO TABS: ORAL_TABLET | ORAL | 0 refills | Status: DC

## 2023-09-18 MED ORDER — AMLODIPINE BESYLATE 5 MG PO TABS: 5.0000 mg | ORAL_TABLET | Freq: Every day | ORAL | 1 refills | Status: DC

## 2023-09-18 MED ORDER — TRELEGY ELLIPTA 200-62.5-25 MCG/ACT IN AEPB: 1.0000 | INHALATION_SPRAY | Freq: Every day | RESPIRATORY_TRACT | 5 refills | Status: DC

## 2023-09-18 MED ORDER — ATORVASTATIN CALCIUM 80 MG PO TABS: 80.0000 mg | ORAL_TABLET | Freq: Every day | ORAL | 0 refills | Status: DC

## 2023-09-18 MED ORDER — NITROGLYCERIN 0.4 MG SL SUBL: 0.4000 mg | SUBLINGUAL_TABLET | SUBLINGUAL | 3 refills | Status: DC | PRN

## 2023-09-18 NOTE — Progress Notes (Signed)
 Subjective:  Patient ID: Socorro Dunks, female    DOB: 05-Jan-1961  Age: 63 y.o. MRN: [id number]  CC: Medication Refill (Pended)   HPI Catelynn Sparger Fritchman presents for  follow-up of hypertension. Patient has no history of headache chest pain or shortness of breath or recent cough. Patient also denies symptoms of TIA such as focal numbness or weakness. Patient denies side effects from medication. States taking it regularly.   in for follow-up of elevated cholesterol. Doing well without complaints on current medication. Denies side effects of statin including myalgia and arthralgia and nausea. Currently no chest pain, shortness of breath or other cardiovascular related symptoms noted.  Patient in for follow-up of GERD. Currently asymptomatic taking  PPI daily. There is no chest pain or heartburn. No hematemesis and no melena. No dysphagia or choking. Onset is remote. Progression is stable. Complicating factors, none.    History Marina has a past medical history of Allergic rhinitis, Back pain, Chronic headache, COPD (chronic obstructive pulmonary disease) (HCC), Emphysema of lung (HCC), Hyperlipidemia, Hypertension, Neck pain, Sleep apnea, and Vaginal Pap smear, abnormal.   She has a past surgical history that includes Neck surgery; Tonsillectomy; Cesarean section; Gynecologic cryosurgery; and Colonoscopy (N/A, 01/07/2015).   Her family history includes Bone cancer in her mother; Cancer in her maternal grandmother; Heart attack in her brother; Mental illness in her son.She reports that she quit smoking about 7 years ago. Her smoking use included cigarettes. She started smoking about 40 years ago. She has a 33 pack-year smoking history. She has never used smokeless tobacco. She reports that she does not drink alcohol and does not use drugs.  Current Outpatient Medications on File Prior to Visit  Medication Sig Dispense Refill   albuterol  (VENTOLIN  HFA) 108 (90 Base) MCG/ACT inhaler Inhale 2 puffs into the  lungs every 4 (four) hours as needed for wheezing or shortness of breath. 18 g 11   ALPRAZolam (XANAX) 0.5 MG tablet Take 0.25-0.5 mg by mouth daily as needed.     Armodafinil 250 MG tablet Take 250 mg by mouth daily.     aspirin 81 MG EC tablet Take 1 tablet by mouth daily.     buPROPion HCl ER, XL, 450 MG TB24 Take 450 mg by mouth daily.     dexlansoprazole  (DEXILANT ) 60 MG capsule Take 1 capsule (60 mg total) by mouth daily. 90 capsule 3   Evolocumab  (REPATHA  SURECLICK) 140 MG/ML SOAJ INJECT 140 MG ( ) EVERY 2 WEEKS 2 mL 5   ezetimibe  (ZETIA ) 10 MG tablet TAKE ONE TABLET DAILY 90 tablet 3   HYDROcodone -acetaminophen  (NORCO) 10-325 MG tablet Take 1 tablet by mouth 3 (three) times daily as needed.     ipratropium-albuterol  (DUONEB) 0.5-2.5 (3) MG/3ML SOLN Take 3 mLs by nebulization every 6 (six) hours as needed. 360 mL 3   Loratadine 10 MG CAPS 1 Once A Day Oral     naloxone (NARCAN) nasal spray 4 mg/0.1 mL      nortriptyline (PAMELOR) 75 MG capsule Take 75 mg by mouth at bedtime.     olmesartan  (BENICAR ) 20 MG tablet TAKE ONE TABLET DAILY 90 tablet 1   tiZANidine  (ZANAFLEX ) 4 MG tablet Take 0.5-1 tablets (2-4 mg total) by mouth at bedtime. 30 tablet 5   No current facility-administered medications on file prior to visit.    ROS Review of Systems  Constitutional: Negative.   HENT: Negative.    Eyes:  Negative for visual disturbance.  Respiratory:  Negative for shortness  of breath.   Cardiovascular:  Negative for chest pain.  Gastrointestinal:  Negative for abdominal pain.  Musculoskeletal:  Negative for arthralgias.    Objective:  BP 127/77   Pulse 85   Temp 98.5 F (36.9 C)   Ht 5\' 5"  (1.651 m)   Wt 185 lb (83.9 kg)   SpO2 95%   BMI 30.79 kg/m   BP Readings from Last 3 Encounters:  09/18/23 127/77  04/11/23 135/80  04/10/23 138/82    Wt Readings from Last 3 Encounters:  09/18/23 185 lb (83.9 kg)  04/11/23 182 lb (82.6 kg)  04/10/23 182 lb 6.4 oz (82.7 kg)      Physical Exam Constitutional:      General: She is not in acute distress.    Appearance: She is well-developed.  Cardiovascular:     Rate and Rhythm: Normal rate and regular rhythm.  Pulmonary:     Breath sounds: Normal breath sounds.  Musculoskeletal:        General: Normal range of motion.  Skin:    General: Skin is warm and dry.  Neurological:     Mental Status: She is alert and oriented to person, place, and time.       Assessment & Plan:  Iron deficiency -     Iron, TIBC and Ferritin Panel -     CBC with Differential/Platelet -     CMP14+EGFR  Aortic atherosclerosis (HCC) -     CBC with Differential/Platelet -     CMP14+EGFR  Centrilobular emphysema (HCC) -     CBC with Differential/Platelet -     CMP14+EGFR  Essential hypertension -     CBC with Differential/Platelet -     CMP14+EGFR  Gastroesophageal reflux disease without esophagitis -     CBC with Differential/Platelet -     CMP14+EGFR  Hyperlipidemia, unspecified hyperlipidemia type -     CBC with Differential/Platelet -     CMP14+EGFR -     Lipid panel  Other orders -     Atorvastatin  Calcium ; Take 1 tablet (80 mg total) by mouth daily.  Dispense: 90 tablet; Refill: 0 -     amLODIPine  Besylate; Take 1 tablet (5 mg total) by mouth daily.  Dispense: 30 tablet; Refill: 1 -     Trelegy Ellipta ; Inhale 1 puff into the lungs daily.  Dispense: 60 each; Refill: 5 -     Nitroglycerin ; Place 1 tablet (0.4 mg total) under the tongue every 5 (five) minutes as needed for chest pain.  Dispense: 50 tablet; Refill: 3 -     predniSONE ; Take 5 daily for 2 days followed by 4,3,2 and 1 for 2 days each.  Dispense: 30 tablet; Refill: 0    Allergies as of 09/18/2023       Reactions   Clindamycin/lincomycin Rash   Penicillins    Unknown        Medication List        Accurate as of September 18, 2023  8:54 PM. If you have any questions, ask your nurse or doctor.          STOP taking these medications     HomeNeb with Sidestream Misc Stopped by: Yasseen Salls       TAKE these medications    albuterol  108 (90 Base) MCG/ACT inhaler Commonly known as: VENTOLIN  HFA Inhale 2 puffs into the lungs every 4 (four) hours as needed for wheezing or shortness of breath.   ALPRAZolam 0.5 MG tablet Commonly known as: XANAX  Take 0.25-0.5 mg by mouth daily as needed.   amLODipine  5 MG tablet Commonly known as: NORVASC  Take 1 tablet (5 mg total) by mouth daily. What changed: See the new instructions. Changed by: Eddrick Dilone   Armodafinil 250 MG tablet Take 250 mg by mouth daily.   aspirin EC 81 MG tablet Take 1 tablet by mouth daily.   atorvastatin  80 MG tablet Commonly known as: LIPITOR Take 1 tablet (80 mg total) by mouth daily.   buPROPion HCl ER (XL) 450 MG Tb24 Take 450 mg by mouth daily.   dexlansoprazole  60 MG capsule Commonly known as: DEXILANT  Take 1 capsule (60 mg total) by mouth daily.   ezetimibe  10 MG tablet Commonly known as: ZETIA  TAKE ONE TABLET DAILY   HYDROcodone -acetaminophen  10-325 MG tablet Commonly known as: NORCO Take 1 tablet by mouth 3 (three) times daily as needed.   ipratropium-albuterol  0.5-2.5 (3) MG/3ML Soln Commonly known as: DUONEB Take 3 mLs by nebulization every 6 (six) hours as needed.   Loratadine 10 MG Caps 1 Once A Day Oral   naloxone 4 MG/0.1ML Liqd nasal spray kit Commonly known as: NARCAN   nitroGLYCERIN  0.4 MG SL tablet Commonly known as: NITROSTAT  Place 1 tablet (0.4 mg total) under the tongue every 5 (five) minutes as needed for chest pain. Started by: Kitty Cadavid   nortriptyline 75 MG capsule Commonly known as: PAMELOR Take 75 mg by mouth at bedtime.   olmesartan  20 MG tablet Commonly known as: BENICAR  TAKE ONE TABLET DAILY   predniSONE  10 MG tablet Commonly known as: DELTASONE  Take 5 daily for 2 days followed by 4,3,2 and 1 for 2 days each. What changed: additional instructions Changed by: Rosana Farnell    Repatha  SureClick 140 MG/ML Soaj Generic drug: Evolocumab  INJECT 140 MG (1ML) EVERY 2 WEEKS   tiZANidine  4 MG tablet Commonly known as: ZANAFLEX  Take 0.5-1 tablets (2-4 mg total) by mouth at bedtime.   Trelegy Ellipta  200-62.5-25 MCG/ACT Aepb Generic drug: Fluticasone -Umeclidin-Vilant Inhale 1 puff into the lungs daily. What changed: See the new instructions. Changed by: Vallorie Gayer Joeleen Wortley         Follow-up: Return in about 3 months (around 12/18/2023).  Roise Cleaver, M.D.

## 2023-09-19 LAB — IRON,TIBC AND FERRITIN PANEL
Ferritin: 102 ng/mL (ref 15–150)
Iron Saturation: 22 % (ref 15–55)
Iron: 74 ug/dL (ref 27–139)
Total Iron Binding Capacity: 344 ug/dL (ref 250–450)
UIBC: 270 ug/dL (ref 118–369)

## 2023-09-19 LAB — CMP14+EGFR
ALT: 46 IU/L — ABNORMAL HIGH (ref 0–32)
AST: 31 IU/L (ref 0–40)
Albumin: 4.5 g/dL (ref 3.9–4.9)
Alkaline Phosphatase: 183 IU/L — ABNORMAL HIGH (ref 44–121)
BUN/Creatinine Ratio: 14 (ref 12–28)
BUN: 15 mg/dL (ref 8–27)
Bilirubin Total: 0.3 mg/dL (ref 0.0–1.2)
CO2: 21 mmol/L (ref 20–29)
Calcium: 9.8 mg/dL (ref 8.7–10.3)
Chloride: 99 mmol/L (ref 96–106)
Creatinine, Ser: 1.07 mg/dL — ABNORMAL HIGH (ref 0.57–1.00)
Globulin, Total: 2.3 g/dL (ref 1.5–4.5)
Glucose: 90 mg/dL (ref 70–99)
Potassium: 4.6 mmol/L (ref 3.5–5.2)
Sodium: 138 mmol/L (ref 134–144)
Total Protein: 6.8 g/dL (ref 6.0–8.5)
eGFR: 58 mL/min/{1.73_m2} — ABNORMAL LOW (ref >59)

## 2023-09-19 LAB — CBC WITH DIFFERENTIAL/PLATELET
Basophils Absolute: 0.1 10*3/uL (ref 0.0–0.2)
Basos: 1 %
EOS (ABSOLUTE): 0.1 10*3/uL (ref 0.0–0.4)
Eos: 2 %
Hematocrit: 38.3 % (ref 34.0–46.6)
Hemoglobin: 12.6 g/dL (ref 11.1–15.9)
Immature Grans (Abs): 0 10*3/uL (ref 0.0–0.1)
Immature Granulocytes: 0 %
Lymphocytes Absolute: 1.6 10*3/uL (ref 0.7–3.1)
Lymphs: 29 %
MCH: 30.1 pg (ref 26.6–33.0)
MCHC: 32.9 g/dL (ref 31.5–35.7)
MCV: 92 fL (ref 79–97)
Monocytes Absolute: 0.6 10*3/uL (ref 0.1–0.9)
Monocytes: 11 %
Neutrophils Absolute: 3.2 10*3/uL (ref 1.4–7.0)
Neutrophils: 57 %
Platelets: 323 10*3/uL (ref 150–450)
RBC: 4.18 x10E6/uL (ref 3.77–5.28)
RDW: 12.3 % (ref 11.7–15.4)
WBC: 5.6 10*3/uL (ref 3.4–10.8)

## 2023-09-19 LAB — LIPID PANEL
Cholesterol, Total: 148 mg/dL (ref 100–199)
HDL: 90 mg/dL (ref >39)
LDL CALC COMMENT:: 1.6 ratio (ref 0.0–4.4)
LDL Chol Calc (NIH): 38 mg/dL (ref 0–99)
Triglycerides: 118 mg/dL (ref 0–149)
VLDL Cholesterol Cal: 20 mg/dL (ref 5–40)

## 2023-09-24 ENCOUNTER — Encounter: Payer: Self-pay | Admitting: Family Medicine

## 2023-09-24 DIAGNOSIS — M7711 Lateral epicondylitis, right elbow: Secondary | ICD-10-CM | POA: Diagnosis not present

## 2023-09-24 DIAGNOSIS — M25521 Pain in right elbow: Secondary | ICD-10-CM | POA: Diagnosis not present

## 2023-09-24 DIAGNOSIS — M25511 Pain in right shoulder: Secondary | ICD-10-CM | POA: Diagnosis not present

## 2023-09-24 DIAGNOSIS — G8929 Other chronic pain: Secondary | ICD-10-CM | POA: Diagnosis not present

## 2023-10-03 DIAGNOSIS — G4731 Primary central sleep apnea: Secondary | ICD-10-CM | POA: Diagnosis not present

## 2023-10-04 DIAGNOSIS — M25511 Pain in right shoulder: Secondary | ICD-10-CM | POA: Diagnosis not present

## 2023-10-04 DIAGNOSIS — M47814 Spondylosis without myelopathy or radiculopathy, thoracic region: Secondary | ICD-10-CM | POA: Diagnosis not present

## 2023-10-04 DIAGNOSIS — M5134 Other intervertebral disc degeneration, thoracic region: Secondary | ICD-10-CM | POA: Diagnosis not present

## 2023-10-04 DIAGNOSIS — M47812 Spondylosis without myelopathy or radiculopathy, cervical region: Secondary | ICD-10-CM | POA: Diagnosis not present

## 2023-10-04 DIAGNOSIS — G894 Chronic pain syndrome: Secondary | ICD-10-CM | POA: Diagnosis not present

## 2023-10-04 DIAGNOSIS — M961 Postlaminectomy syndrome, not elsewhere classified: Secondary | ICD-10-CM | POA: Diagnosis not present

## 2023-10-04 DIAGNOSIS — M797 Fibromyalgia: Secondary | ICD-10-CM | POA: Diagnosis not present

## 2023-10-04 DIAGNOSIS — Z79891 Long term (current) use of opiate analgesic: Secondary | ICD-10-CM | POA: Diagnosis not present

## 2023-10-04 DIAGNOSIS — M546 Pain in thoracic spine: Secondary | ICD-10-CM | POA: Diagnosis not present

## 2023-10-04 DIAGNOSIS — Z79899 Other long term (current) drug therapy: Secondary | ICD-10-CM | POA: Diagnosis not present

## 2023-10-04 DIAGNOSIS — M25529 Pain in unspecified elbow: Secondary | ICD-10-CM | POA: Diagnosis not present

## 2023-10-09 DIAGNOSIS — F411 Generalized anxiety disorder: Secondary | ICD-10-CM | POA: Diagnosis not present

## 2023-10-09 DIAGNOSIS — F900 Attention-deficit hyperactivity disorder, predominantly inattentive type: Secondary | ICD-10-CM | POA: Diagnosis not present

## 2023-10-09 DIAGNOSIS — G8921 Chronic pain due to trauma: Secondary | ICD-10-CM | POA: Diagnosis not present

## 2023-10-09 DIAGNOSIS — F339 Major depressive disorder, recurrent, unspecified: Secondary | ICD-10-CM | POA: Diagnosis not present

## 2023-10-23 DIAGNOSIS — Z129 Encounter for screening for malignant neoplasm, site unspecified: Secondary | ICD-10-CM | POA: Diagnosis not present

## 2023-10-23 DIAGNOSIS — D235 Other benign neoplasm of skin of trunk: Secondary | ICD-10-CM | POA: Diagnosis not present

## 2023-10-23 DIAGNOSIS — L820 Inflamed seborrheic keratosis: Secondary | ICD-10-CM | POA: Diagnosis not present

## 2023-10-23 DIAGNOSIS — D2261 Melanocytic nevi of right upper limb, including shoulder: Secondary | ICD-10-CM | POA: Diagnosis not present

## 2023-10-23 DIAGNOSIS — D2272 Melanocytic nevi of left lower limb, including hip: Secondary | ICD-10-CM | POA: Diagnosis not present

## 2023-10-23 DIAGNOSIS — L570 Actinic keratosis: Secondary | ICD-10-CM | POA: Diagnosis not present

## 2023-10-23 DIAGNOSIS — L821 Other seborrheic keratosis: Secondary | ICD-10-CM | POA: Diagnosis not present

## 2023-10-23 DIAGNOSIS — D2262 Melanocytic nevi of left upper limb, including shoulder: Secondary | ICD-10-CM | POA: Diagnosis not present

## 2023-11-06 DIAGNOSIS — M25521 Pain in right elbow: Secondary | ICD-10-CM | POA: Diagnosis not present

## 2023-11-06 DIAGNOSIS — M7521 Bicipital tendinitis, right shoulder: Secondary | ICD-10-CM | POA: Diagnosis not present

## 2023-11-06 DIAGNOSIS — M75101 Unspecified rotator cuff tear or rupture of right shoulder, not specified as traumatic: Secondary | ICD-10-CM | POA: Diagnosis not present

## 2023-11-06 DIAGNOSIS — M7711 Lateral epicondylitis, right elbow: Secondary | ICD-10-CM | POA: Diagnosis not present

## 2023-11-07 DIAGNOSIS — G894 Chronic pain syndrome: Secondary | ICD-10-CM | POA: Diagnosis not present

## 2023-11-07 DIAGNOSIS — M797 Fibromyalgia: Secondary | ICD-10-CM | POA: Diagnosis not present

## 2023-11-07 DIAGNOSIS — M546 Pain in thoracic spine: Secondary | ICD-10-CM | POA: Diagnosis not present

## 2023-11-07 DIAGNOSIS — M961 Postlaminectomy syndrome, not elsewhere classified: Secondary | ICD-10-CM | POA: Diagnosis not present

## 2023-11-07 DIAGNOSIS — M25529 Pain in unspecified elbow: Secondary | ICD-10-CM | POA: Diagnosis not present

## 2023-11-07 DIAGNOSIS — Z79891 Long term (current) use of opiate analgesic: Secondary | ICD-10-CM | POA: Diagnosis not present

## 2023-11-07 DIAGNOSIS — M47814 Spondylosis without myelopathy or radiculopathy, thoracic region: Secondary | ICD-10-CM | POA: Diagnosis not present

## 2023-11-07 DIAGNOSIS — M25521 Pain in right elbow: Secondary | ICD-10-CM | POA: Diagnosis not present

## 2023-11-07 DIAGNOSIS — M5134 Other intervertebral disc degeneration, thoracic region: Secondary | ICD-10-CM | POA: Diagnosis not present

## 2023-11-07 DIAGNOSIS — Z79899 Other long term (current) drug therapy: Secondary | ICD-10-CM | POA: Diagnosis not present

## 2023-11-07 DIAGNOSIS — M25511 Pain in right shoulder: Secondary | ICD-10-CM | POA: Diagnosis not present

## 2023-11-07 DIAGNOSIS — M47812 Spondylosis without myelopathy or radiculopathy, cervical region: Secondary | ICD-10-CM | POA: Diagnosis not present

## 2023-11-19 ENCOUNTER — Encounter: Payer: Self-pay | Admitting: Family Medicine

## 2023-11-19 ENCOUNTER — Ambulatory Visit (INDEPENDENT_AMBULATORY_CARE_PROVIDER_SITE_OTHER): Admitting: Family Medicine

## 2023-11-19 VITALS — BP 133/78 | HR 90 | Temp 97.3°F | Ht 65.0 in | Wt 186.0 lb

## 2023-11-19 DIAGNOSIS — I10 Essential (primary) hypertension: Secondary | ICD-10-CM | POA: Diagnosis not present

## 2023-11-19 DIAGNOSIS — M797 Fibromyalgia: Secondary | ICD-10-CM

## 2023-11-19 DIAGNOSIS — M542 Cervicalgia: Secondary | ICD-10-CM | POA: Diagnosis not present

## 2023-11-19 DIAGNOSIS — M47812 Spondylosis without myelopathy or radiculopathy, cervical region: Secondary | ICD-10-CM | POA: Diagnosis not present

## 2023-11-19 MED ORDER — PREDNISONE 10 MG PO TABS
ORAL_TABLET | ORAL | 0 refills | Status: DC
Start: 2023-11-19 — End: 2024-02-27

## 2023-11-19 MED ORDER — BETAMETHASONE SOD PHOS & ACET 6 (3-3) MG/ML IJ SUSP
6.0000 mg | Freq: Once | INTRAMUSCULAR | Status: AC
  Administered 2023-11-19: 6 mg via INTRAMUSCULAR
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
## 2023-12-05 DIAGNOSIS — M25511 Pain in right shoulder: Secondary | ICD-10-CM | POA: Diagnosis not present

## 2023-12-05 DIAGNOSIS — M47812 Spondylosis without myelopathy or radiculopathy, cervical region: Secondary | ICD-10-CM | POA: Diagnosis not present

## 2023-12-05 DIAGNOSIS — Z79891 Long term (current) use of opiate analgesic: Secondary | ICD-10-CM | POA: Diagnosis not present

## 2023-12-05 DIAGNOSIS — M5134 Other intervertebral disc degeneration, thoracic region: Secondary | ICD-10-CM | POA: Diagnosis not present

## 2023-12-05 DIAGNOSIS — M961 Postlaminectomy syndrome, not elsewhere classified: Secondary | ICD-10-CM | POA: Diagnosis not present

## 2023-12-05 DIAGNOSIS — M546 Pain in thoracic spine: Secondary | ICD-10-CM | POA: Diagnosis not present

## 2023-12-05 DIAGNOSIS — M47814 Spondylosis without myelopathy or radiculopathy, thoracic region: Secondary | ICD-10-CM | POA: Diagnosis not present

## 2023-12-05 DIAGNOSIS — M25521 Pain in right elbow: Secondary | ICD-10-CM | POA: Diagnosis not present

## 2023-12-05 DIAGNOSIS — M797 Fibromyalgia: Secondary | ICD-10-CM | POA: Diagnosis not present

## 2023-12-05 DIAGNOSIS — Z79899 Other long term (current) drug therapy: Secondary | ICD-10-CM | POA: Diagnosis not present

## 2023-12-05 DIAGNOSIS — G894 Chronic pain syndrome: Secondary | ICD-10-CM | POA: Diagnosis not present

## 2023-12-05 DIAGNOSIS — M25529 Pain in unspecified elbow: Secondary | ICD-10-CM | POA: Diagnosis not present

## 2023-12-12 ENCOUNTER — Other Ambulatory Visit: Payer: Self-pay | Admitting: Family Medicine

## 2023-12-12 DIAGNOSIS — M791 Myalgia, unspecified site: Secondary | ICD-10-CM | POA: Diagnosis not present

## 2023-12-13 ENCOUNTER — Other Ambulatory Visit: Payer: Self-pay | Admitting: Family Medicine

## 2023-12-26 DIAGNOSIS — M5412 Radiculopathy, cervical region: Secondary | ICD-10-CM | POA: Diagnosis not present

## 2024-01-02 DIAGNOSIS — Z79899 Other long term (current) drug therapy: Secondary | ICD-10-CM | POA: Diagnosis not present

## 2024-01-02 DIAGNOSIS — Z79891 Long term (current) use of opiate analgesic: Secondary | ICD-10-CM | POA: Diagnosis not present

## 2024-01-02 DIAGNOSIS — M546 Pain in thoracic spine: Secondary | ICD-10-CM | POA: Diagnosis not present

## 2024-01-02 DIAGNOSIS — M25521 Pain in right elbow: Secondary | ICD-10-CM | POA: Diagnosis not present

## 2024-01-02 DIAGNOSIS — M47812 Spondylosis without myelopathy or radiculopathy, cervical region: Secondary | ICD-10-CM | POA: Diagnosis not present

## 2024-01-02 DIAGNOSIS — G894 Chronic pain syndrome: Secondary | ICD-10-CM | POA: Diagnosis not present

## 2024-01-02 DIAGNOSIS — M5134 Other intervertebral disc degeneration, thoracic region: Secondary | ICD-10-CM | POA: Diagnosis not present

## 2024-01-02 DIAGNOSIS — M25529 Pain in unspecified elbow: Secondary | ICD-10-CM | POA: Diagnosis not present

## 2024-01-02 DIAGNOSIS — M47814 Spondylosis without myelopathy or radiculopathy, thoracic region: Secondary | ICD-10-CM | POA: Diagnosis not present

## 2024-01-02 DIAGNOSIS — M961 Postlaminectomy syndrome, not elsewhere classified: Secondary | ICD-10-CM | POA: Diagnosis not present

## 2024-01-02 DIAGNOSIS — M25511 Pain in right shoulder: Secondary | ICD-10-CM | POA: Diagnosis not present

## 2024-01-02 DIAGNOSIS — M797 Fibromyalgia: Secondary | ICD-10-CM | POA: Diagnosis not present

## 2024-01-10 ENCOUNTER — Ambulatory Visit

## 2024-01-12 ENCOUNTER — Other Ambulatory Visit: Payer: Self-pay | Admitting: Family Medicine

## 2024-01-22 ENCOUNTER — Other Ambulatory Visit: Payer: Self-pay | Admitting: Pharmacist

## 2024-01-22 DIAGNOSIS — I2510 Atherosclerotic heart disease of native coronary artery without angina pectoris: Secondary | ICD-10-CM

## 2024-01-22 DIAGNOSIS — E785 Hyperlipidemia, unspecified: Secondary | ICD-10-CM

## 2024-01-22 MED ORDER — REPATHA SURECLICK 140 MG/ML ~~LOC~~ SOAJ: 140.0000 mg | SUBCUTANEOUS | 1 refills | Status: DC

## 2024-02-05 DIAGNOSIS — G4731 Primary central sleep apnea: Secondary | ICD-10-CM | POA: Diagnosis not present

## 2024-02-13 ENCOUNTER — Other Ambulatory Visit: Payer: Self-pay | Admitting: Family Medicine

## 2024-02-13 NOTE — Telephone Encounter (Unsigned)
 Copied from CRM [id number]. Topic: Clinical - Medication Refill >> Feb 13, 2024  3:12 PM [name] wrote: Medication: ALPRAZolam (XANAX) 0.5 MG tablet  Has the patient contacted their pharmacy? Yes (Agent: If no, request that the patient contact the pharmacy for the refill. If patient does not wish to contact the pharmacy document the reason why and proceed with request.) (Agent: If yes, when and what did the pharmacy advise?)pharmacy need script.   This is the patient's preferred pharmacy:  [facility] [place], [place] - [address] [place] [address] Phone: [phone] Fax: [phone]   Is this the correct pharmacy for this prescription? Yes If no, delete pharmacy and type the correct one.   Has the prescription been filled recently? Yes  Is the patient out of the medication? Yes  Has the patient been seen for an appointment in the last year OR does the patient have an upcoming appointment? Yes  Can we respond through MyChart? Yes  Agent: Please be advised that Rx refills may take up to 3 business days. We ask that you follow-up with your pharmacy.

## 2024-02-18 DIAGNOSIS — M5134 Other intervertebral disc degeneration, thoracic region: Secondary | ICD-10-CM | POA: Diagnosis not present

## 2024-02-18 DIAGNOSIS — Z79899 Other long term (current) drug therapy: Secondary | ICD-10-CM | POA: Diagnosis not present

## 2024-02-18 DIAGNOSIS — G894 Chronic pain syndrome: Secondary | ICD-10-CM | POA: Diagnosis not present

## 2024-02-18 DIAGNOSIS — M47814 Spondylosis without myelopathy or radiculopathy, thoracic region: Secondary | ICD-10-CM | POA: Diagnosis not present

## 2024-02-18 DIAGNOSIS — M25511 Pain in right shoulder: Secondary | ICD-10-CM | POA: Diagnosis not present

## 2024-02-18 DIAGNOSIS — M47812 Spondylosis without myelopathy or radiculopathy, cervical region: Secondary | ICD-10-CM | POA: Diagnosis not present

## 2024-02-18 DIAGNOSIS — M961 Postlaminectomy syndrome, not elsewhere classified: Secondary | ICD-10-CM | POA: Diagnosis not present

## 2024-02-18 DIAGNOSIS — M25521 Pain in right elbow: Secondary | ICD-10-CM | POA: Diagnosis not present

## 2024-02-18 DIAGNOSIS — Z79891 Long term (current) use of opiate analgesic: Secondary | ICD-10-CM | POA: Diagnosis not present

## 2024-02-18 DIAGNOSIS — M25529 Pain in unspecified elbow: Secondary | ICD-10-CM | POA: Diagnosis not present

## 2024-02-18 DIAGNOSIS — M797 Fibromyalgia: Secondary | ICD-10-CM | POA: Diagnosis not present

## 2024-02-18 DIAGNOSIS — M546 Pain in thoracic spine: Secondary | ICD-10-CM | POA: Diagnosis not present

## 2024-02-20 ENCOUNTER — Other Ambulatory Visit: Payer: Self-pay | Admitting: Family Medicine

## 2024-02-20 NOTE — Telephone Encounter (Signed)
 Copied from CRM [id number]. Topic: Clinical - Prescription Issue >> Feb 20, 2024  1:55 PM Donna BRAVO wrote: Reason for CRM: patient calling, Prescribe [facility] Dr. Bluford office no longer is in network with patient insurance. Patient would like Dr Zollie to take over refill medications that Dr Bluford  originally prescribed,  ALPRAZolam (XANAX) 0.5 MG tablet nortriptyline (PAMELOR) 75 MG capsule nortriptyline (PAMELOR) 25 MG capsule  buPROPion HCl ER, XL, 450 MG TB24   Patient requesting refill for ALPRAZolam (XANAX) 0.5 MG tablet  [facility] [place], [place] - [address] [place] [address] Phone: [phone] Fax: [phone]   Patient would like a call back regarding Dr Zollie refilling Rx Phone [phone]

## 2024-02-27 ENCOUNTER — Ambulatory Visit: Payer: Self-pay

## 2024-02-27 ENCOUNTER — Encounter: Payer: Self-pay | Admitting: Family Medicine

## 2024-02-27 ENCOUNTER — Ambulatory Visit (INDEPENDENT_AMBULATORY_CARE_PROVIDER_SITE_OTHER): Admitting: Family Medicine

## 2024-02-27 VITALS — BP 121/77 | HR 98 | Temp 98.2°F | Ht 65.0 in | Wt 185.6 lb

## 2024-02-27 DIAGNOSIS — M542 Cervicalgia: Secondary | ICD-10-CM

## 2024-02-27 DIAGNOSIS — G8929 Other chronic pain: Secondary | ICD-10-CM | POA: Diagnosis not present

## 2024-02-27 DIAGNOSIS — R400 Somnolence: Secondary | ICD-10-CM

## 2024-02-27 DIAGNOSIS — E611 Iron deficiency: Secondary | ICD-10-CM | POA: Diagnosis not present

## 2024-02-27 DIAGNOSIS — M25511 Pain in right shoulder: Secondary | ICD-10-CM

## 2024-02-27 MED ORDER — ALPRAZOLAM 0.5 MG PO TABS: 0.2500 mg | ORAL_TABLET | Freq: Every day | ORAL | 0 refills | Status: DC | PRN

## 2024-02-27 MED ORDER — TIZANIDINE HCL 4 MG PO TABS: 2.0000 mg | ORAL_TABLET | Freq: Every day | ORAL | 5 refills | Status: DC

## 2024-02-27 MED ORDER — PREDNISONE 10 MG PO TABS: ORAL_TABLET | ORAL | 0 refills | Status: DC

## 2024-02-27 NOTE — Progress Notes (Signed)
 Subjective:  Patient ID: Sarah Chapman, female    DOB: [birth year]  Age: 63 y.o. MRN: [id number]  CC: No chief complaint on file.   HPI  Discussed the use of AI scribe software for clinical note transcription with the patient, who gave verbal consent to proceed.  History of Present Illness Sarah Chapman is a 63 year old female who presents with a recent flare-up of chronic arm pain involving the neck and shoulder.  The pain is rated as 7 to 8 out of 10 and is described as feeling 'broken' with arm movement. It began after pressure washing activities over the weekend and has persisted for a couple of days. The pain is exacerbated by movements such as turning her head or lifting her arm. She has a history of receiving a neck injection from a pain specialist, which previously alleviated her arm pain. She reports that past MRI and X-ray evaluations were performed, and she was told the pain may be related to her neck or shoulder. She is currently unable to receive another injection due to insurance limitations and is seeking prednisone  for symptom management.  Her current medications include hydrocodone , oxycodone , armodafinil, and alprazolam. She takes Zanaflex  at night as a muscle relaxer. She is running low on alprazolam and is seeking a new psychiatrist as her previous one no longer accepts her insurance. She wants one doctor to manage her medications.  She reports fatigue and has stopped taking iron pills due to constipation. She is concerned about anemia as a potential cause of her tiredness. She has a history of being treated for narcolepsy and has been on armodafinil since 2003 to manage daytime somnolence.  No recent injury or fall. The pain is not tender but hurts with movement, particularly in the front of the shoulder and neck area. She experiences pain in the neck when the shoulder is rotated.          02/27/2024    2:21 PM 11/19/2023    1:53 PM 09/18/2023    1:37 PM  Depression screen  PHQ 2/9  Decreased Interest 2 3 2   Down, Depressed, Hopeless 2 3 2   PHQ - 2 Score 4 6 4   Altered sleeping 2 1 2   Tired, decreased energy 3 1 2   Change in appetite 2 2 2   Feeling bad or failure about yourself  1 1 2   Trouble concentrating 2 3 2   Moving slowly or fidgety/restless 2 1 2   Suicidal thoughts 0 0 0  PHQ-9 Score 16 15 16   Difficult doing work/chores Extremely dIfficult  Very difficult    History Kellin has a past medical history of Allergic rhinitis, Back pain, Chronic headache, COPD (chronic obstructive pulmonary disease) (HCC), Emphysema of lung (HCC), Hyperlipidemia, Hypertension, Neck pain, Sleep apnea, and Vaginal Pap smear, abnormal.   She has a past surgical history that includes Neck surgery; Tonsillectomy; Cesarean section; Gynecologic cryosurgery; and Colonoscopy (N/A, 01/07/2015).   Her family history includes Bone cancer in her mother; Cancer in her maternal grandmother; Heart attack in her brother; Mental illness in her son.She reports that she quit smoking about 7 years ago. Her smoking use included cigarettes. She started smoking about 40 years ago. She has a 33 pack-year smoking history. She has never used smokeless tobacco. She reports that she does not drink alcohol and does not use drugs.    ROS Review of Systems  Constitutional: Negative.   HENT:  Negative for congestion.   Eyes:  Negative for visual  disturbance.  Respiratory:  Negative for shortness of breath.   Cardiovascular:  Negative for chest pain.  Gastrointestinal:  Negative for abdominal pain, constipation, diarrhea, nausea and vomiting.  Genitourinary:  Negative for difficulty urinating.  Musculoskeletal:  Negative for arthralgias and myalgias.  Neurological:  Negative for headaches.  Psychiatric/Behavioral:  Negative for sleep disturbance.     Objective:  BP 121/77   Pulse 98   Temp 98.2 F (36.8 C)   Ht 5' 5 (1.651 m)   Wt 185 lb 9.6 oz (84.2 kg)   SpO2 93%   BMI 30.89 kg/m   BP  Readings from Last 3 Encounters:  02/27/24 121/77  11/19/23 133/78  09/18/23 127/77    Wt Readings from Last 3 Encounters:  02/27/24 185 lb 9.6 oz (84.2 kg)  11/19/23 186 lb (84.4 kg)  09/18/23 185 lb (83.9 kg)     Physical Exam Physical Exam GENERAL: Alert, cooperative, well developed, no acute distress. HEENT: Normocephalic, normal oropharynx, moist mucous membranes. CHEST: Clear to auscultation bilaterally. No wheezes, rhonchi, or crackles. CARDIOVASCULAR: Normal heart rate and rhythm, S1 and S2 normal without murmurs. ABDOMEN: Soft, non-tender, non-distended, without organomegaly. Normal bowel sounds. EXTREMITIES: No cyanosis or edema. MUSCULOSKELETAL: Right shoulder pain with movement, no tenderness. Pain in neck with right shoulder rotation. Pain between neck and shoulder on trapezius. NEUROLOGICAL: Cranial nerves grossly intact. Moves all extremities without gross motor or sensory deficit.   Assessment & Plan:  Cervicalgia -     predniSONE ; Take 5 daily for 3 days followed by 4,3,2 and 1 for 3 days each.  Dispense: 45 tablet; Refill: 0 -     CBC with Differential/Platelet -     CMP14+EGFR  Daytime somnolence -     Ambulatory referral to Psychiatry  Iron deficiency  Chronic right shoulder pain  Other orders -     ALPRAZolam; Take 0.5-1 tablets (0.25-0.5 mg total) by mouth daily as needed.  Dispense: 30 tablet; Refill: 0 -     tiZANidine  HCl; Take 0.5-1 tablets (2-4 mg total) by mouth at bedtime.  Dispense: 30 tablet; Refill: 5    Assessment and Plan Assessment & Plan Cervical spondylosis with chronic neck, shoulder, and right arm pain   Chronic pain in the neck, shoulder, and right arm is exacerbated by recent physical activity, likely due to cervical spondylosis with AC joint and trapezius involvement. Pain is rated 7-8/10. A previous neck injection provided relief, but insurance limits frequency. Movement worsens the pain. Prescribe prednisone  with a tapering  dose of 5, 4, 3, 2, 1 over three days each.  Anxiety disorder and depression   Anxiety and depression are managed with alprazolam. Her current psychiatrist no longer accepts her insurance, necessitating a search for a new psychiatrist. Prescribe a 30-day supply of alprazolam and assist in finding a new psychiatrist in [place].  Daytime somnolence, possible narcolepsy   Daytime somnolence is possibly due to narcolepsy. It was previously managed with armodafinil, but the current provider is unable to prescribe it. Assist in finding a new psychiatrist to evaluate and manage possible narcolepsy.  Obstructive sleep apnea, on CPAP   Obstructive sleep apnea is managed with CPAP. She continues to experience fatigue, possibly related to anemia or other factors.  Iron deficiency anemia, possible   She reports fatigue and has discontinued iron pills due to constipation. Anemia is suspected as a potential cause of fatigue. Order blood work to check for anemia.       Follow-up: Return if symptoms worsen or  fail to improve.  Butler Der, M.D.

## 2024-02-27 NOTE — Telephone Encounter (Signed)
 FYI Only or Action Required?: FYI only for provider.  Patient was last seen in primary care on 11/19/2023 by Zollie Lowers, MD.  Called Nurse Triage reporting right arm pain.  Symptoms began several days ago.  Interventions attempted: Nothing.  Symptoms are: unchanged.  Triage Disposition: See HCP Within 4 Hours (Or PCP Triage)  Patient/caregiver understands and will follow disposition?: Yes    Copied from CRM [id number]. Topic: Clinical - Red Word Triage >> Feb 27, 2024  9:33 AM Wess RAMAN wrote: Red Word that prompted transfer to Nurse Triage: Patient stated she messed up her arm last year. It feels like it is broken. Right arm feels like it is on fire Reason for Disposition  [1] Arm pains with exertion (e.g., walking) AND [2] pain goes away on resting AND [3] not present now  Answer Assessment - Initial Assessment Questions 1. ONSET: When did the pain start?     Couple of days ago 2. LOCATION: Where is the pain located?     Right arm 3. PAIN: How bad is the pain? (Scale 0-10; or none, mild, moderate, severe)     severe 4. WORK OR EXERCISE: Has there been any recent work or exercise that involved this part of the body?     Was working in the yard 5. CAUSE: What do you think is causing the arm pain?     unknown 6. OTHER SYMPTOMS: Do you have any other symptoms? (e.g., neck pain, swelling, rash, fever, numbness, weakness)     denies 7. PREGNANCY: Is there any chance you are pregnant? When was your last menstrual period?     na  Protocols used: Arm Pain-A-AH

## 2024-02-28 LAB — CBC WITH DIFFERENTIAL/PLATELET
Basophils Absolute: 0 x10E3/uL (ref 0.0–0.2)
Basos: 0 %
EOS (ABSOLUTE): 0 x10E3/uL (ref 0.0–0.4)
Eos: 0 %
Hematocrit: 42.7 % (ref 34.0–46.6)
Hemoglobin: 14 g/dL (ref 11.1–15.9)
Immature Grans (Abs): 0.1 x10E3/uL (ref 0.0–0.1)
Immature Granulocytes: 1 %
Lymphocytes Absolute: 0.8 x10E3/uL (ref 0.7–3.1)
Lymphs: 9 %
MCH: 31.5 pg (ref 26.6–33.0)
MCHC: 32.8 g/dL (ref 31.5–35.7)
MCV: 96 fL (ref 79–97)
Monocytes Absolute: 0.2 x10E3/uL (ref 0.1–0.9)
Monocytes: 2 %
Neutrophils Absolute: 8.1 x10E3/uL — ABNORMAL HIGH (ref 1.4–7.0)
Neutrophils: 88 %
Platelets: 424 x10E3/uL (ref 150–450)
RBC: 4.44 x10E6/uL (ref 3.77–5.28)
RDW: 13.3 % (ref 11.7–15.4)
WBC: 9.2 x10E3/uL (ref 3.4–10.8)

## 2024-02-28 LAB — CMP14+EGFR
ALT: 30 IU/L (ref 0–32)
AST: 21 IU/L (ref 0–40)
Albumin: 4.8 g/dL (ref 3.9–4.9)
Alkaline Phosphatase: 134 IU/L (ref 49–135)
BUN/Creatinine Ratio: 11 — AB (ref 12–28)
BUN: 10 mg/dL (ref 8–27)
Bilirubin Total: 0.3 mg/dL (ref 0.0–1.2)
CO2: 20 mmol/L (ref 20–29)
Calcium: 10.2 mg/dL (ref 8.7–10.3)
Chloride: 105 mmol/L (ref 96–106)
Creatinine, Ser: 0.88 mg/dL (ref 0.57–1.00)
Globulin, Total: 2.4 g/dL (ref 1.5–4.5)
Glucose: 134 mg/dL — AB (ref 70–99)
Potassium: 4.5 mmol/L (ref 3.5–5.2)
Sodium: 142 mmol/L (ref 134–144)
Total Protein: 7.2 g/dL (ref 6.0–8.5)
eGFR: 73 mL/min/1.73 (ref >59)

## 2024-02-28 MED ORDER — BUPROPION HCL ER (XL) 450 MG PO TB24: 450.0000 mg | ORAL_TABLET | Freq: Every day | ORAL | 5 refills | Status: AC

## 2024-02-28 MED ORDER — NORTRIPTYLINE HCL 75 MG PO CAPS: 75.0000 mg | ORAL_CAPSULE | Freq: Every day | ORAL | 1 refills | Status: DC

## 2024-02-28 NOTE — Telephone Encounter (Signed)
 Pt request RFs on Bupropion & Nortriptyline before yesterday's visit. These are historical meds. Was this discussed during visit Please advise on RFs

## 2024-03-02 ENCOUNTER — Ambulatory Visit: Payer: Self-pay | Admitting: Family Medicine

## 2024-03-02 NOTE — Progress Notes (Signed)
Hello Darleene,  Your lab result is normal and/or stable.Some minor variations that are not significant are commonly marked abnormal, but do not represent any medical problem for you.  Best regards, Nahlia Hellmann, M.D.

## 2024-03-17 ENCOUNTER — Other Ambulatory Visit: Payer: Self-pay | Admitting: Cardiology

## 2024-03-19 ENCOUNTER — Ambulatory Visit (INDEPENDENT_AMBULATORY_CARE_PROVIDER_SITE_OTHER)

## 2024-03-19 ENCOUNTER — Other Ambulatory Visit: Payer: Self-pay | Admitting: Gastroenterology

## 2024-03-19 VITALS — BP 121/77 | HR 98 | Ht 65.0 in | Wt 185.0 lb

## 2024-03-19 DIAGNOSIS — Z1231 Encounter for screening mammogram for malignant neoplasm of breast: Secondary | ICD-10-CM

## 2024-03-19 DIAGNOSIS — Z0000 Encounter for general adult medical examination without abnormal findings: Secondary | ICD-10-CM | POA: Diagnosis not present

## 2024-03-19 DIAGNOSIS — M5412 Radiculopathy, cervical region: Secondary | ICD-10-CM | POA: Diagnosis not present

## 2024-03-19 DIAGNOSIS — Z13820 Encounter for screening for osteoporosis: Secondary | ICD-10-CM

## 2024-03-19 NOTE — Progress Notes (Signed)
 Subjective:   Sarah Chapman is a 63 y.o. who presents for a Medicare Wellness preventive visit.  As a reminder, Annual Wellness Visits don't include a physical exam, and some assessments may be limited, especially if this visit is performed virtually. We may recommend an in-person follow-up visit with your provider if needed.  Visit Complete: Virtual I connected with  [name] on 03/19/24 by a audio enabled telemedicine application and verified that I am speaking with the correct person using two identifiers.  Patient Location: Home  Provider Location: Home Office  I discussed the limitations of evaluation and management by telemedicine. The patient expressed understanding and agreed to proceed.  Vital Signs: Because this visit was a virtual/telehealth visit, some criteria may be missing or patient reported. Any vitals not documented were not able to be obtained and vitals that have been documented are patient reported.  VideoDeclined- This patient declined Librarian, academic. Therefore the visit was completed with audio only.  Persons Participating in Visit: Patient.  AWV Questionnaire: No: Patient Medicare AWV questionnaire was not completed prior to this visit.  Cardiac Risk Factors include: advanced age (>26men, >67 women);dyslipidemia;smoking/ tobacco exposure;obesity (BMI >30kg/m2)     Objective:    Today's Vitals   03/19/24 1241  BP: 121/77  Pulse: 98  Weight: 185 lb (83.9 kg)  Height: 5' 5 (1.651 m)   Body mass index is 30.79 kg/m.     03/19/2024   12:50 PM 09/11/2022    3:40 PM 09/05/2021    3:04 PM 04/03/2014    3:41 PM  Advanced Directives  Does Patient Have a Medical Advance Directive? No No No No   Would patient like information on creating a medical advance directive?  No - Patient declined No - Patient declined No - patient declined information      Data saved with a previous flowsheet row definition    Current Medications  (verified) Outpatient Encounter Medications as of 03/19/2024  Medication Sig   albuterol  (VENTOLIN  HFA) 108 (90 Base) MCG/ACT inhaler Inhale 2 puffs into the lungs every 4 (four) hours as needed for wheezing or shortness of breath.   ALPRAZolam (XANAX) 0.5 MG tablet Take 0.5-1 tablets (0.25-0.5 mg total) by mouth daily as needed.   amLODipine  (NORVASC ) 5 MG tablet TAKE ONE TABLET ONCE DAILY   Armodafinil 250 MG tablet Take 250 mg by mouth daily.   aspirin 81 MG EC tablet Take 1 tablet by mouth daily.   atorvastatin  (LIPITOR) 80 MG tablet TAKE ONE TABLET ONCE DAILY   buPROPion HCl ER, XL, 450 MG TB24 Take 1 tablet (450 mg total) by mouth daily.   dexlansoprazole  (DEXILANT ) 60 MG capsule Take 1 capsule (60 mg total) by mouth daily.   Evolocumab  (REPATHA  SURECLICK) 140 MG/ML SOAJ Inject 140 mg into the skin every 14 (fourteen) days.   ezetimibe  (ZETIA ) 10 MG tablet TAKE ONE TABLET DAILY   Fluticasone -Umeclidin-Vilant (TRELEGY ELLIPTA ) 200-62.5-25 MCG/ACT AEPB Inhale 1 puff into the lungs daily.   HYDROcodone -acetaminophen  (NORCO) 10-325 MG tablet Take 1 tablet by mouth 3 (three) times daily as needed.   ipratropium-albuterol  (DUONEB) 0.5-2.5 (3) MG/3ML SOLN Take 3 mLs by nebulization every 6 (six) hours as needed.   Loratadine 10 MG CAPS 1 Once A Day Oral   naloxone (NARCAN) nasal spray 4 mg/0.1 mL    nitroGLYCERIN  (NITROSTAT ) 0.4 MG SL tablet Place 1 tablet (0.4 mg total) under the tongue every 5 (five) minutes as needed for chest pain.  nortriptyline (PAMELOR) 75 MG capsule Take 1 capsule (75 mg total) by mouth at bedtime.   olmesartan  (BENICAR ) 20 MG tablet TAKE ONE TABLET DAILY   tiZANidine  (ZANAFLEX ) 4 MG tablet Take 0.5-1 tablets (2-4 mg total) by mouth at bedtime.   predniSONE  (DELTASONE ) 10 MG tablet Take 5 daily for 3 days followed by 4,3,2 and 1 for 3 days each. (Patient not taking: Reported on 03/19/2024)   No facility-administered encounter medications on file as of 03/19/2024.     Allergies (verified) Clindamycin/lincomycin and Penicillins   History: Past Medical History:  Diagnosis Date   Allergic rhinitis    Back pain    Chronic headache    COPD (chronic obstructive pulmonary disease) (HCC)    Emphysema of lung (HCC)    Hyperlipidemia    Hypertension    Neck pain    Sleep apnea    Vaginal Pap smear, abnormal    Past Surgical History:  Procedure Laterality Date   CESAREAN SECTION     COLONOSCOPY N/A 01/07/2015   internal hemorrhoids, diverticulosis in sigmoid colon, exam otherwise normal, no biopsies.   GYNECOLOGIC CRYOSURGERY     NECK SURGERY     TONSILLECTOMY     Family History  Problem Relation Age of Onset   Bone cancer Mother    Cancer Maternal Grandmother    Heart attack Brother    Mental illness Son    Colon cancer Neg Hx    Colon polyps Neg Hx    Breast cancer Neg Hx    Social History   Socioeconomic History   Marital status: Divorced    Spouse name: Not on file   Number of children: Not on file   Years of education: Not on file   Highest education level: Not on file  Occupational History   Not on file  Tobacco Use   Smoking status: Former    Current packs/day: 0.00    Average packs/day: 1 pack/day for 33.0 years (33.0 ttl pk-yrs)    Types: Cigarettes    Start date: 07/26/1983    Quit date: 07/25/2016    Years since quitting: 7.6   Smokeless tobacco: Never  Vaping Use   Vaping status: Never Used  Substance and Sexual Activity   Alcohol use: No   Drug use: No   Sexual activity: Not Currently    Birth control/protection: Post-menopausal  Other Topics Concern   Not on file  Social History Narrative   Not on file   Social Drivers of Health   Financial Resource Strain: Low Risk  (03/19/2024)   Overall Financial Resource Strain (CARDIA)    Difficulty of Paying Living Expenses: Not hard at all  Food Insecurity: No Food Insecurity (03/19/2024)   Hunger Vital Sign    Worried About Running Out of Food in the Last  Year: Never true    Ran Out of Food in the Last Year: Never true  Transportation Needs: No Transportation Needs (03/19/2024)   PRAPARE - Administrator, Civil Service (Medical): No    Lack of Transportation (Non-Medical): No  Physical Activity: Insufficiently Active (03/19/2024)   Exercise Vital Sign    Days of Exercise per Week: 3 days    Minutes of Exercise per Session: 30 min  Stress: No Stress Concern Present (03/19/2024)   [employer] of Occupational Health - Occupational Stress Questionnaire    Feeling of Stress: Not at all  Social Connections: Moderately Integrated (03/19/2024)   Social Connection and Isolation Panel  Frequency of Communication with Friends and Family: More than three times a week    Frequency of Social Gatherings with Friends and Family: More than three times a week    Attends Religious Services: More than 4 times per year    Active Member of [employer] or Organizations: Yes    Attends Engineer, structural: More than 4 times per year    Marital Status: Divorced    Tobacco Counseling Counseling given: Yes    Clinical Intake:  Pre-visit preparation completed: Yes  Pain : No/denies pain     BMI - recorded: 30.79 Nutritional Status: BMI > 30  Obese Nutritional Risks: None Diabetes: No  No results found for: HGBA1C   How often do you need to have someone help you when you Siravo instructions, pamphlets, or other written materials from your doctor or pharmacy?: 1 - Never  Interpreter Needed?: No  Information entered by :: alia t/cma   Activities of Daily Living     03/19/2024   12:45 PM  In your present state of health, do you have any difficulty performing the following activities:  Hearing? 0  Vision? 0  Difficulty concentrating or making decisions? 0  Walking or climbing stairs? 0  Dressing or bathing? 0  Doing errands, shopping? 0  Preparing Food and eating ? N  Using the Toilet? N  In the past six months,  have you accidently leaked urine? N  Do you have problems with loss of bowel control? N  Managing your Medications? N  Managing your Finances? N  Housekeeping or managing your Housekeeping? N    Patient Care Team: Zollie Lowers, MD as PCP - General (Family Medicine) Kate Lonni CROME, MD as PCP - Cardiology (Cardiology) Harvey Margo CROME, MD (Inactive) as Consulting Physician (Gastroenterology) Janene Boer, [place] (Cardiology) Duke, Jon Garre, PA as Physician Assistant (Cardiology)  I have updated your Care Teams any recent Medical Services you may have received from other providers in the past year.     Assessment:   This is a routine wellness examination for Makhia.  Hearing/Vision screen Hearing Screening - Comments:: Pt denies hearing dif Vision Screening - Comments:: Pt wear glasses/pt [facility] [place], [place]/last ov 1 week ago   Goals Addressed             This Visit's Progress    DIET - INCREASE WATER INTAKE   On track      Depression Screen     03/19/2024   12:51 PM 02/27/2024    2:21 PM 11/19/2023    1:53 PM 09/18/2023    1:37 PM 04/11/2023    1:11 PM 04/11/2023    1:05 PM 02/06/2023   10:15 AM  PHQ 2/9 Scores  PHQ - 2 Score 4 4 6 4 5  0 4  PHQ- 9 Score 6 16 15 16 14  17     Fall Risk     03/19/2024   12:44 PM 02/27/2024    2:21 PM 04/11/2023    1:05 PM 02/06/2023   10:14 AM 02/06/2023    9:41 AM  Fall Risk   Falls in the past year? 0 0 0 0 0  Number falls in past yr:  0     Injury with Fall?  0     Risk for fall due to : No Fall Risks No Fall Risks     Follow up Falls evaluation completed;Education provided Falls evaluation completed       MEDICARE RISK AT  HOME:  Medicare Risk at Home Any stairs in or around the home?: No If so, are there any without handrails?: No Home free of loose throw rugs in walkways, pet beds, electrical cords, etc?: Yes Adequate lighting in your home to reduce risk of falls?: Yes Life alert?: No Use of  a cane, walker or w/c?: No Grab bars in the bathroom?: Yes Shower chair or bench in shower?: No Elevated toilet seat or a handicapped toilet?: No  TIMED UP AND GO:  Was the test performed?  no  Cognitive Function: 6CIT completed        03/19/2024   12:51 PM 09/11/2022    3:41 PM 09/05/2021    3:09 PM  6CIT Screen  What Year? 0 points 0 points 0 points  What month? 0 points 0 points 0 points  What time? 0 points 0 points 0 points  Count back from 20 0 points 0 points 0 points  Months in reverse 0 points 0 points 2 points  Repeat phrase 0 points 0 points 0 points  Total Score 0 points 0 points 2 points    Immunizations Immunization History  Administered Date(s) Administered   Moderna Sars-Covid-2 Vaccination 08/25/2019, 09/22/2019   PFIZER Comirnaty(Gray Top)Covid-19 Tri-Sucrose Vaccine 03/02/2020   Tdap 06/10/2007, 04/03/2014   Zoster Recombinant(Shingrix) 07/08/2020, 09/16/2020    Screening Tests Health Maintenance  Topic Date Due   Hepatitis C Screening  Never done   Pneumococcal Vaccine: 50+ Years (1 of 2 - PCV) Never done   DEXA SCAN  12/28/2018   Cervical Cancer Screening (HPV/Pap Cotest)  11/07/2022   Mammogram  02/21/2024   Influenza Vaccine  08/26/2024 (Originally 12/28/2023)   COVID-19 Vaccine (4 - 2025-26 season) 03/15/2025 (Originally 01/28/2024)   DTaP/Tdap/Td (3 - Td or Tdap) 04/03/2024   Lung Cancer Screening  07/22/2024   Colonoscopy  01/06/2025   Medicare Annual Wellness (AWV)  03/19/2025   HIV Screening  Completed   Zoster Vaccines- Shingrix  Completed   Hepatitis B Vaccines 19-59 Average Risk  Aged Out   HPV VACCINES  Aged Out   Meningococcal B Vaccine  Aged Out    Health Maintenance Items Addressed: Mammogram ordered and Dexa Scan ordered  Additional Screening:  Vision Screening: Recommended annual ophthalmology exams for early detection of glaucoma and other disorders of the eye. Is the patient up to date with their annual eye exam?  Yes   Who is the provider or what is the name of the office in which the patient attends annual eye exams? [facility] in [employer]  Dental Screening: Recommended annual dental exams for proper oral hygiene  Community Resource Referral / Chronic Care Management: CRR required this visit?  No   CCM required this visit?  No   Plan:    I have personally reviewed and noted the following in the patient's chart:   Medical and social history Use of alcohol, tobacco or illicit drugs  Current medications and supplements including opioid prescriptions. Patient is not currently taking opioid prescriptions. Functional ability and status Nutritional status Physical activity Advanced directives List of other physicians Hospitalizations, surgeries, and ER visits in previous 12 months Vitals Screenings to include cognitive, depression, and falls Referrals and appointments  In addition, I have reviewed and discussed with patient certain preventive protocols, quality metrics, and best practice recommendations. A written personalized care plan for preventive services as well as general preventive health recommendations were provided to patient.   Ozie Ned, CMA   03/19/2024  After Visit Summary: (MyChart) Due to this being a telephonic visit, the after visit summary with patients personalized plan was offered to patient via MyChart   Notes: PCP Follow Up Recommendations: pt aware that she is due for the following: Dexa scan/mammoram--both ordered, pap smear--pneumonia vaccine--pt declined

## 2024-03-19 NOTE — Patient Instructions (Signed)
 Sarah Chapman,  Thank you for taking the time for your Medicare Wellness Visit. I appreciate your continued commitment to your health goals. Please review the care plan we discussed, and feel free to reach out if I can assist you further.  Medicare recommends these wellness visits once per year to help you and your care team stay ahead of potential health issues. These visits are designed to focus on prevention, allowing your provider to concentrate on managing your acute and chronic conditions during your regular appointments.  Please note that Annual Wellness Visits do not include a physical exam. Some assessments may be limited, especially if the visit was conducted virtually. If needed, we may recommend a separate in-person follow-up with your provider.  Ongoing Care Seeing your primary care provider every 3 to 6 months helps us  monitor your health and provide consistent, personalized care.   Referrals If a referral was made during today's visit and you haven't received any updates within two weeks, please contact the referred provider directly to check on the status.  Recommended Screenings:  Health Maintenance  Topic Date Due   Hepatitis C Screening  Never done   Pneumococcal Vaccine for age over 50 (1 of 2 - PCV) Never done   DEXA scan (bone density measurement)  12/28/2018   Pap with HPV screening  11/07/2022   Medicare Annual Wellness Visit  09/11/2023   Breast Cancer Screening  02/21/2024   Flu Shot  08/26/2024*   COVID-19 Vaccine (4 - 2025-26 season) 03/15/2025*   DTaP/Tdap/Td vaccine (3 - Td or Tdap) 04/03/2024   Screening for Lung Cancer  07/22/2024   Colon Cancer Screening  01/06/2025   HIV Screening  Completed   Zoster (Shingles) Vaccine  Completed   Hepatitis B Vaccine  Aged Out   HPV Vaccine  Aged Out   Meningitis B Vaccine  Aged Out  *Topic was postponed. The date shown is not the original due date.       03/19/2024   12:50 PM  Advanced Directives  Does Patient  Have a Medical Advance Directive? No   Advance Care Planning is important because it: Ensures you receive medical care that aligns with your values, goals, and preferences. Provides guidance to your family and loved ones, reducing the emotional burden of decision-making during critical moments.  Vision: Annual vision screenings are recommended for early detection of glaucoma, cataracts, and diabetic retinopathy. These exams can also reveal signs of chronic conditions such as diabetes and high blood pressure.  Dental: Annual dental screenings help detect early signs of oral cancer, gum disease, and other conditions linked to overall health, including heart disease and diabetes.  Please see the attached documents for additional preventive care recommendations.

## 2024-03-20 ENCOUNTER — Telehealth (HOSPITAL_BASED_OUTPATIENT_CLINIC_OR_DEPARTMENT_OTHER): Payer: Self-pay

## 2024-03-20 NOTE — Telephone Encounter (Signed)
Pt needs an appointment before anymore refills.

## 2024-03-21 ENCOUNTER — Telehealth: Payer: Self-pay

## 2024-03-21 NOTE — Telephone Encounter (Signed)
 Pt was made aware that this message probably will not get answered until Monday (which she is ok with that). Pt stated she is really constipated and the laxatives are not working anymore. I advised the pt she could seek treatment @ ED or PCP but she declined. I advised her that I could not give her any advise to constipation because she had been seen for GERD, and there is nothing under meds that we had prescribed to her. Pt expressed understanding and states she will wait. Again I advised of ED or PCP she declined.

## 2024-03-24 NOTE — Telephone Encounter (Signed)
 We haven't seen her in a year, so I can't provide much or prescribe anything before seeing her.  She can take Miralax daily to BID as needed.  Please have her come in for an appointment.

## 2024-03-25 NOTE — Telephone Encounter (Signed)
Phoned and LMOVM for the pt to return call 

## 2024-03-26 NOTE — Telephone Encounter (Signed)
 Phoned the pt and advise of the Miralax, instructions, and to keep her follow up appt with Therisa Stager on 12/24 @ 9:30am. Pt expressed understanding

## 2024-03-31 ENCOUNTER — Other Ambulatory Visit: Payer: Self-pay | Admitting: *Deleted

## 2024-04-15 ENCOUNTER — Other Ambulatory Visit: Payer: Self-pay | Admitting: Cardiology

## 2024-04-15 ENCOUNTER — Other Ambulatory Visit: Payer: Self-pay | Admitting: *Deleted

## 2024-04-15 NOTE — Telephone Encounter (Signed)
**  Patient must schedule an overdue followup appt with Cardiology for any more refills**. [phone] 2nd attempt Thank You

## 2024-04-16 DIAGNOSIS — M961 Postlaminectomy syndrome, not elsewhere classified: Secondary | ICD-10-CM | POA: Diagnosis not present

## 2024-04-16 DIAGNOSIS — G894 Chronic pain syndrome: Secondary | ICD-10-CM | POA: Diagnosis not present

## 2024-04-16 DIAGNOSIS — Z79891 Long term (current) use of opiate analgesic: Secondary | ICD-10-CM | POA: Diagnosis not present

## 2024-04-16 DIAGNOSIS — M797 Fibromyalgia: Secondary | ICD-10-CM | POA: Diagnosis not present

## 2024-04-16 DIAGNOSIS — M25521 Pain in right elbow: Secondary | ICD-10-CM | POA: Diagnosis not present

## 2024-04-16 DIAGNOSIS — M47812 Spondylosis without myelopathy or radiculopathy, cervical region: Secondary | ICD-10-CM | POA: Diagnosis not present

## 2024-04-16 DIAGNOSIS — M5134 Other intervertebral disc degeneration, thoracic region: Secondary | ICD-10-CM | POA: Diagnosis not present

## 2024-04-16 DIAGNOSIS — M546 Pain in thoracic spine: Secondary | ICD-10-CM | POA: Diagnosis not present

## 2024-04-16 DIAGNOSIS — M25529 Pain in unspecified elbow: Secondary | ICD-10-CM | POA: Diagnosis not present

## 2024-04-16 DIAGNOSIS — M25511 Pain in right shoulder: Secondary | ICD-10-CM | POA: Diagnosis not present

## 2024-04-16 DIAGNOSIS — M47814 Spondylosis without myelopathy or radiculopathy, thoracic region: Secondary | ICD-10-CM | POA: Diagnosis not present

## 2024-04-16 DIAGNOSIS — Z79899 Other long term (current) drug therapy: Secondary | ICD-10-CM | POA: Diagnosis not present

## 2024-04-29 ENCOUNTER — Ambulatory Visit: Payer: Self-pay | Admitting: Family

## 2024-04-29 ENCOUNTER — Encounter: Payer: Self-pay | Admitting: Family

## 2024-04-29 ENCOUNTER — Ambulatory Visit: Payer: Self-pay | Admitting: *Deleted

## 2024-04-29 ENCOUNTER — Telehealth: Payer: Self-pay | Admitting: Family Medicine

## 2024-04-29 ENCOUNTER — Ambulatory Visit: Admitting: Family

## 2024-04-29 VITALS — BP 134/75 | HR 73 | Temp 97.5°F | Ht 65.0 in | Wt 185.0 lb

## 2024-04-29 DIAGNOSIS — J029 Acute pharyngitis, unspecified: Secondary | ICD-10-CM

## 2024-04-29 DIAGNOSIS — R590 Localized enlarged lymph nodes: Secondary | ICD-10-CM

## 2024-04-29 DIAGNOSIS — J209 Acute bronchitis, unspecified: Secondary | ICD-10-CM | POA: Diagnosis not present

## 2024-04-29 LAB — RAPID STREP SCREEN (MED CTR MEBANE ONLY): Strep Gp A Ag, IA W/Reflex: NEGATIVE

## 2024-04-29 LAB — VERITOR SARS-COV-2 AND FLU A+B
BD Veritor SARS-CoV-2 Ag: NEGATIVE
Influenza A: NEGATIVE
Influenza B: NEGATIVE

## 2024-04-29 LAB — CULTURE, GROUP A STREP

## 2024-04-29 MED ORDER — PREDNISONE 10 MG (21) PO TBPK: ORAL_TABLET | ORAL | 0 refills | Status: DC

## 2024-04-29 MED ORDER — BENZONATATE 200 MG PO CAPS: 200.0000 mg | ORAL_CAPSULE | Freq: Two times a day (BID) | ORAL | 0 refills | Status: DC | PRN

## 2024-04-29 NOTE — Progress Notes (Signed)
 Subjective:    Patient ID: Sarah Chapman, female    DOB: February 24, 1961, 63 y.o.   MRN: [id number]  Chief Complaint  Patient presents with   Cough   knot under neck    Sore Throat   Constipation   Pt presents to the office today with with sore throat and cough that started 6 days ago. Reports she noticed a large lump in right neck last night.  Cough This is a new problem. The current episode started in the past 7 days. The problem has been waxing and waning. The problem occurs every few minutes. The cough is Non-productive. Associated symptoms include ear congestion, ear pain, nasal congestion, postnasal drip, a sore throat and shortness of breath.  Sore Throat  This is a new problem. The current episode started in the past 7 days. There has been no fever. The pain is at a severity of 3/10. The pain is mild. Associated symptoms include congestion, coughing, ear pain, a hoarse voice, shortness of breath, swollen glands and trouble swallowing. She has had no exposure to strep or mono. She has tried acetaminophen  and NSAIDs (norco) for the symptoms. The treatment provided mild relief.      Review of Systems  HENT:  Positive for congestion, ear pain, hoarse voice, postnasal drip, sore throat and trouble swallowing.   Respiratory:  Positive for cough and shortness of breath.   All other systems reviewed and are negative.   Social History   Socioeconomic History   Marital status: Divorced    Spouse name: Not on file   Number of children: Not on file   Years of education: Not on file   Highest education level: Not on file  Occupational History   Not on file  Tobacco Use   Smoking status: Former    Current packs/day: 0.00    Average packs/day: 1 pack/day for 33.0 years (33.0 ttl pk-yrs)    Types: Cigarettes    Start date: 07/26/1983    Quit date: 07/25/2016    Years since quitting: 7.7   Smokeless tobacco: Never  Vaping Use   Vaping status: Never Used  Substance and Sexual Activity    Alcohol use: No   Drug use: No   Sexual activity: Not Currently    Birth control/protection: Post-menopausal  Other Topics Concern   Not on file  Social History Narrative   Not on file   Social Drivers of Health   Financial Resource Strain: Low Risk  (03/19/2024)   Overall Financial Resource Strain (CARDIA)    Difficulty of Paying Living Expenses: Not hard at all  Food Insecurity: No Food Insecurity (03/19/2024)   Hunger Vital Sign    Worried About Running Out of Food in the Last Year: Never true    Ran Out of Food in the Last Year: Never true  Transportation Needs: No Transportation Needs (03/19/2024)   PRAPARE - Administrator, Civil Service (Medical): No    Lack of Transportation (Non-Medical): No  Physical Activity: Insufficiently Active (03/19/2024)   Exercise Vital Sign    Days of Exercise per Week: 3 days    Minutes of Exercise per Session: 30 min  Stress: No Stress Concern Present (03/19/2024)   [employer] of Occupational Health - Occupational Stress Questionnaire    Feeling of Stress: Not at all  Social Connections: Moderately Integrated (03/19/2024)   Social Connection and Isolation Panel    Frequency of Communication with Friends and Family: More than three times a  week    Frequency of Social Gatherings with Friends and Family: More than three times a week    Attends Religious Services: More than 4 times per year    Active Member of [employer] or Organizations: Yes    Attends Engineer, Structural: More than 4 times per year    Marital Status: Divorced   Family History  Problem Relation Age of Onset   Bone cancer Mother    Cancer Maternal Grandmother    Heart attack Brother    Mental illness Son    Colon cancer Neg Hx    Colon polyps Neg Hx    Breast cancer Neg Hx         Objective:   Physical Exam Vitals reviewed.  Constitutional:      General: She is not in acute distress.    Appearance: She is well-developed.  HENT:      Head: Normocephalic and atraumatic.     Right Ear: Tympanic membrane normal.     Left Ear: Tympanic membrane normal.  Eyes:     Pupils: Pupils are equal, round, and reactive to light.  Neck:     Thyroid : No thyromegaly.      Comments: Large tender lymph node Cardiovascular:     Rate and Rhythm: Regular rhythm.     Heart sounds: Normal heart sounds. No murmur heard. Pulmonary:     Effort: Pulmonary effort is normal. No respiratory distress.     Breath sounds: Normal breath sounds. No wheezing.  Abdominal:     General: Bowel sounds are normal. There is no distension.     Palpations: Abdomen is soft.     Tenderness: There is no abdominal tenderness.  Musculoskeletal:        General: No tenderness. Normal range of motion.     Cervical back: Normal range of motion and neck supple.  Skin:    General: Skin is warm and dry.  Neurological:     Mental Status: She is alert and oriented to person, place, and time.     Cranial Nerves: No cranial nerve deficit.     Deep Tendon Reflexes: Reflexes are normal and symmetric.  Psychiatric:        Behavior: Behavior normal.        Thought Content: Thought content normal.        Judgment: Judgment normal.       BP 134/75   Pulse 73   Temp (!) 97.5 F (36.4 C) (Temporal)   Ht 5' 5 (1.651 m)   Wt 185 lb (83.9 kg)   SpO2 95%   BMI 30.79 kg/m      Assessment & Plan:  Sarah Chapman comes in today with chief complaint of Cough, knot under neck , Sore Throat, and Constipation   Diagnosis and orders addressed:  1. Sore throat (Primary) - Rapid Strep Screen ([facility] ONLY) - Veritor SARS-CoV-2 and Flu A+B - Epstein-Barr virus VCA antibody panel  2. Cervical lymphadenopathy - Epstein-Barr virus VCA antibody panel - EPSTEIN-BARR VIRUS (EBV) Antibody Profile  3. Acute bronchitis, unspecified organism - Take meds as prescribed - Use a cool mist humidifier  -Use saline nose sprays frequently -Force fluids -For any cough or  congestion  Use plain Mucinex - regular strength or max strength is fine -For fever or aces or pains- take tylenol  or ibuprofen. -Throat lozenges if help -New toothbrush in 3 days -Follow up if symptoms worsen or do not improve  - predniSONE  (STERAPRED UNI-PAK  21 TAB) 10 MG (21) TBPK tablet; Use as directed  Dispense: 21 tablet; Refill: 0 - benzonatate  (TESSALON ) 200 MG capsule; Take 1 capsule (200 mg total) by mouth 2 (two) times daily as needed for cough.  Dispense: 20 capsule; Refill: 0   Mono test pending  Strep, flu, and COVID negative Start prednisone  and tessalon  Let me if symptoms worsen or do not improve     Bari Learn, FNP

## 2024-04-29 NOTE — Telephone Encounter (Unsigned)
 Copied from CRM [id number]. Topic: Referral - Status >> Apr 29, 2024 10:56 AM Graeme ORN wrote: Reason for CRM: [facility] called about referral received. States Patient was seen there until July and provider stopped accepting insurance patient has. No longer seeing this patient. Referral needs to go to different provider that accepts insurance. Thank You

## 2024-04-29 NOTE — Telephone Encounter (Signed)
 New referral sent.

## 2024-04-29 NOTE — Patient Instructions (Signed)

## 2024-04-29 NOTE — Telephone Encounter (Signed)
 FYI Only or Action Required?: FYI only for provider: appointment scheduled on 04/29/24.  Patient was last seen in primary care on 02/27/2024 by Zollie Lowers, MD.  Called Nurse Triage reporting [place].  Symptoms began yesterday.  Interventions attempted: Nothing.  Symptoms are: gradually worsening.  Triage Disposition: See Physician Within 24 Hours  Patient/caregiver understands and will follow disposition?: Yes  Patient requesting to be seen today with any provider.             Copied from CRM [id number]. Topic: Clinical - Red Word Triage >> Apr 29, 2024  7:38 AM Donna BRAVO wrote: Red Word that prompted transfer to Nurse Triage:  Symptoms:  -has had cold since 04/23/24  -last night a lumb showed up right side neck under jaw -size of an egg -painful Reason for Disposition  [1] Swelling is painful to touch AND [2] no fever  Answer Assessment - Initial Assessment Questions Appt today with DOD. Patient requesting appt today    1. APPEARANCE of SWELLING: What does it look like?     Lump size of egg no redness, painful to touch 2. SIZE: How large is the swelling? (e.g., inches, cm; or compare to size of pinhead, tip of pen, eraser, coin, pea, grape, ping pong ball)      egg 3. LOCATION: Where is the swelling located?     Right side neck under jaw 4. ONSET: When did the swelling start?     Last night  5. COLOR: What color is it? Is there more than one color?     No color  6. PAIN: Is there any pain? If Yes, ask: How bad is the pain? (Scale 1-10; or mild, moderate, severe)       Yes, mild  7. ITCH: Does it itch? If Yes, ask: How bad is the itch?      na 8. CAUSE: What do you think caused the swelling?     Cold sx possible  9 OTHER SYMPTOMS: Do you have any other symptoms? (e.g., fever)     Cold sx , nasal congestion , head stopped, cough. Chest congestion. No difficulty breathing no chest pain. No fever. Lump right side neck under jaw  Protocols  used: Skin Lump or Localized Swelling-A-AH

## 2024-04-30 ENCOUNTER — Telehealth: Payer: Self-pay

## 2024-04-30 LAB — EPSTEIN-BARR VIRUS (EBV) ANTIBODY PROFILE
EBV NA IgG: 294 U/mL — ABNORMAL HIGH (ref 0.0–17.9)
EBV VCA IgG: 23.1 U/mL — ABNORMAL HIGH (ref 0.0–17.9)
EBV VCA IgM: <36 U/mL (ref 0.0–35.9)

## 2024-04-30 NOTE — Telephone Encounter (Signed)
 Copied from CRM [id number]. Topic: Clinical - Lab/Test Results >> Apr 30, 2024  4:49 PM Sarah Chapman wrote: Reason for CRM: Patient lost access to her mychart and received a notification of results being in for her mono blood test. No result notes found. Contacted [facility] but was advised that the nurses could not provide results at the moment. Patient does not want to wait until tomorrow.

## 2024-05-01 DIAGNOSIS — H43813 Vitreous degeneration, bilateral: Secondary | ICD-10-CM | POA: Diagnosis not present

## 2024-05-01 DIAGNOSIS — H40023 Open angle with borderline findings, high risk, bilateral: Secondary | ICD-10-CM | POA: Diagnosis not present

## 2024-05-01 DIAGNOSIS — H527 Unspecified disorder of refraction: Secondary | ICD-10-CM | POA: Diagnosis not present

## 2024-05-01 DIAGNOSIS — H25813 Combined forms of age-related cataract, bilateral: Secondary | ICD-10-CM | POA: Diagnosis not present

## 2024-05-01 NOTE — Telephone Encounter (Signed)
 Copied from CRM #[id number]. Topic: Clinical - Lab/Test Results >> Apr 30, 2024  2:50 PM Chiquita SQUIBB wrote: Reason for CRM: Patient is calling in for strep test results, please contact patient back when these results become available.

## 2024-05-01 NOTE — Telephone Encounter (Signed)
 Pt notified of the results that have resulted. LS

## 2024-05-02 ENCOUNTER — Ambulatory Visit: Admitting: Family Medicine

## 2024-05-02 ENCOUNTER — Ambulatory Visit: Payer: Self-pay

## 2024-05-02 ENCOUNTER — Encounter: Payer: Self-pay | Admitting: Family Medicine

## 2024-05-02 ENCOUNTER — Other Ambulatory Visit: Payer: Self-pay | Admitting: Cardiology

## 2024-05-02 VITALS — BP 135/69 | HR 89 | Temp 98.2°F | Ht 65.0 in | Wt 187.4 lb

## 2024-05-02 DIAGNOSIS — R590 Localized enlarged lymph nodes: Secondary | ICD-10-CM | POA: Diagnosis not present

## 2024-05-02 DIAGNOSIS — J441 Chronic obstructive pulmonary disease with (acute) exacerbation: Secondary | ICD-10-CM

## 2024-05-02 DIAGNOSIS — J029 Acute pharyngitis, unspecified: Secondary | ICD-10-CM | POA: Diagnosis not present

## 2024-05-02 MED ORDER — PREDNISONE 20 MG PO TABS: 40.0000 mg | ORAL_TABLET | Freq: Every day | ORAL | 0 refills | Status: AC

## 2024-05-02 MED ORDER — DOXYCYCLINE HYCLATE 100 MG PO TABS: 100.0000 mg | ORAL_TABLET | Freq: Two times a day (BID) | ORAL | 0 refills | Status: AC

## 2024-05-02 NOTE — Telephone Encounter (Signed)
 FYI Only or Action Required?: FYI only for provider: appointment scheduled on 05/02/24.  Patient was last seen in primary care on 04/29/2024 by Sarah Bari LABOR, FNP.  Called Nurse Triage reporting Adenopathy and Sore Throat.  Symptoms began several days ago.  Interventions attempted: OTC medications: Mucinex  cough syrup and Prescription medications: prednisone , Tessalon  and Trelegy inhaler.  Symptoms are: gradually worsening.  Triage Disposition: See Physician Within 24 Hours  Patient/caregiver understands and will follow disposition?: Yes                               2. SIZE: How large is the swelling? (e.g., inches, cm; or compare to size of pinhead, tip of pen, eraser, coin, pea, grape, ping pong ball)      Hard boiled egg on both right and left side of neck- states size fluctuates throughout day  3. LOCATION: Where is the swelling located?     Bilateral neck 4. ONSET: When did the swelling start?     Several days ago, 04/28/24, OV on 04/29/24, symptoms are worsening 5. COLOR: What color is it? Is there more than one color?     States swelling is normal skin color, denies redness 6. PAIN: Is there any pain? If Yes, ask: How bad is the pain? (Scale 1-10; or mild, moderate, severe)       Yes, mild 7. ITCH: Does it itch? If Yes, ask: How bad is the itch?      Denies 8. CAUSE: What do you think caused the swelling?     Unsure, possibly mono 9 OTHER SYMPTOMS: Do you have any other symptoms? (e.g., fever)     Worsening sore throat on left side, reports mild/a little difficulty breathing- patient able to speak in clear and complete sentences while on phone with this RN + no labored breathing detected Denies dififculty swallowing, denies fever, denies additional cold/flu symptoms    This RN advised in-person evaluation today. No availability with PCP. This RN scheduled same day appointment with alternate provider in office. Patient  was evaluated in office for symptoms on 04/29/24. Patient reported symptoms are worsening. Patient was advised to follow-up for worsening symptoms, per OV note. Care advice provided as documented.   Copied from CRM #[id number]. Topic: Clinical - Red Word Triage >> May 02, 2024  8:32 AM Alfonso ORN wrote: Red Word that prompted transfer to Nurse Triage: both side of neck swollen  was tested couple of days ago for mono on right side of neck and today the left side is swollen , pt stated it feel like an egg  ----------------------------------------------------------------------- From previous Reason for Contact - Lab/Test Results: Reason for CRM: did blood work  Reason for Disposition  [1] Swelling is painful to touch AND [2] no fever  Protocols used: Skin Lump or Localized Swelling-A-AH

## 2024-05-02 NOTE — Telephone Encounter (Signed)
Spt scheduled

## 2024-05-02 NOTE — Progress Notes (Signed)
 Acute Office Visit  Subjective:     Patient ID: Sarah Chapman, female    DOB: 09/14/1960, 63 y.o.   MRN: [id number]  Chief Complaint  Patient presents with   Sore Throat    HPI  History of Present Illness   Sarah Chapman is a 63 year old female with asthma and COPD who presents with a sore throat and swollen lymph nodes.  Pharyngitis and cervical lymphadenopathy - Sore throat and swollen lymph nodes since Tuesday - Initial swelling more prominent on right side, now bilateral - Swelling fluctuates in size and described as feeling like a 'hard boiled egg' - Lymph nodes are tender to palpation - Throat pain increased after left-sided swelling appeared - Has had negative Covid, flu, strep test - EPV is consistent with past infection  Respiratory symptoms - History of asthma and COPD - Increased shortness of breath and chest tightness, worsened today - Uses rescue inhaler once daily, consistent with baseline - Took Trelegy and another inhaler this morning - No wheezing - Sensation of chest congestion - Former smoker  Immunosuppression and medication use - Frequent prior use of prednisone  - Currently on tapering dose of prednisone , took 40 mg this morning  Medication allergies - 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      Objective:    BP 135/69   Pulse 89   Temp 98.2 F (36.8 C) (Temporal)   Ht 5' 5 (1.651 m)   Wt 187 lb 6.4 oz (85 kg)   SpO2 97%   BMI 31.18 kg/m    Physical Exam Vitals and nursing note reviewed.  Constitutional:      General: She is not in acute distress.    Appearance: She is not ill-appearing, toxic-appearing or diaphoretic.  HENT:     Right Ear: Tympanic membrane, ear canal and external ear normal. No mastoid tenderness.     Left Ear: Tympanic membrane, ear canal and external ear normal. No mastoid tenderness.     Mouth/Throat:      Mouth: Mucous membranes are moist.     Pharynx: Posterior oropharyngeal erythema present. No pharyngeal swelling, oropharyngeal exudate or uvula swelling.     Comments: Tonsils surgically absent Eyes:     Conjunctiva/sclera: Conjunctivae normal.  Cardiovascular:     Rate and Rhythm: Normal rate and regular rhythm.     Heart sounds: Normal heart sounds. No murmur heard. Pulmonary:     Effort: Pulmonary effort is normal. No respiratory distress.     Breath sounds: No wheezing, rhonchi or rales.  Chest:     Chest wall: No tenderness.  Musculoskeletal:     Cervical back: Neck supple.  Lymphadenopathy:     Cervical: Cervical adenopathy present.     Right cervical: Superficial cervical adenopathy present.     Left cervical: Superficial cervical adenopathy present.  Skin:    General: Skin is warm and dry.  Neurological:     General: No focal deficit present.     Mental Status: She is alert and oriented to person, place, and time.  Psychiatric:        Mood and Affect: Mood normal.        Behavior: Behavior normal.     No results found for any visits on 05/02/24.      Assessment & Plan:   Sarah Chapman was seen today for sore  throat.  Diagnoses and all orders for this visit:  Cervical adenopathy -     doxycycline  (VIBRA -TABS) 100 MG tablet; Take 1 tablet (100 mg total) by mouth 2 (two) times daily for 7 days. 1 po bid -     predniSONE  (DELTASONE ) 20 MG tablet; Take 2 tablets (40 mg total) by mouth daily with breakfast for 5 days. Start tomorrow  Acute pharyngitis, unspecified etiology  COPD with acute exacerbation (HCC) -     doxycycline  (VIBRA -TABS) 100 MG tablet; Take 1 tablet (100 mg total) by mouth 2 (two) times daily for 7 days. 1 po bid -     predniSONE  (DELTASONE ) 20 MG tablet; Take 2 tablets (40 mg total) by mouth daily with breakfast for 5 days. Start tomorrow   Assessment and Plan    Cervical adenopathy and acute pharyngitis Negative for strep, COVID, and flu. Past mono  exposure, no current infection. Likely viral etiology, possible mono reactivation. Antibiotics prescribed for potential bacterial infection due to significant cervical radiculopathy - Prescribed doxycycline  twice a day for a week. - Stop prednisone  taper and start prednisone  burst tomorrow for inflammation - Advised to monitor symptoms and report if not improved in two weeks or if symptoms worsen.  COPD with acute exacerbation Increased shortness of breath, cough, and chest tightness. No recent wheezing, increased rescue inhaler use. Smoking history, currently quit.  - Prescribed prednisone  40 mg daily for five days. - Advised to monitor symptoms and report if significantly worse.      Return to office for new or worsening symptoms, or if symptoms persist.   The patient indicates understanding of these issues and agrees with the plan.  Sarah Chapman Search, FNP

## 2024-05-05 ENCOUNTER — Telehealth: Payer: Self-pay | Admitting: Family Medicine

## 2024-05-05 ENCOUNTER — Ambulatory Visit (HOSPITAL_COMMUNITY)
Admission: RE | Admit: 2024-05-05 | Discharge: 2024-05-05 | Disposition: A | Source: Ambulatory Visit | Attending: Family Medicine | Admitting: Family Medicine

## 2024-05-05 ENCOUNTER — Ambulatory Visit: Payer: Self-pay

## 2024-05-05 ENCOUNTER — Encounter: Payer: Self-pay | Admitting: Family Medicine

## 2024-05-05 ENCOUNTER — Ambulatory Visit: Admitting: Family Medicine

## 2024-05-05 VITALS — BP 137/80 | HR 84 | Ht 65.0 in | Wt 181.0 lb

## 2024-05-05 DIAGNOSIS — M7989 Other specified soft tissue disorders: Secondary | ICD-10-CM | POA: Diagnosis not present

## 2024-05-05 DIAGNOSIS — R0789 Other chest pain: Secondary | ICD-10-CM | POA: Diagnosis not present

## 2024-05-05 DIAGNOSIS — R5383 Other fatigue: Secondary | ICD-10-CM | POA: Diagnosis not present

## 2024-05-05 DIAGNOSIS — R221 Localized swelling, mass and lump, neck: Secondary | ICD-10-CM | POA: Diagnosis not present

## 2024-05-05 DIAGNOSIS — G4731 Primary central sleep apnea: Secondary | ICD-10-CM | POA: Diagnosis not present

## 2024-05-05 MED ORDER — NITROGLYCERIN 0.4 MG SL SUBL: 0.4000 mg | SUBLINGUAL_TABLET | SUBLINGUAL | 3 refills | Status: AC | PRN

## 2024-05-05 NOTE — Telephone Encounter (Signed)
 APPT MADE

## 2024-05-05 NOTE — Telephone Encounter (Signed)
Review lab results

## 2024-05-05 NOTE — Telephone Encounter (Signed)
 FYI Only or Action Required?: FYI only for provider: appointment scheduled on 12/8.  Patient was last seen in primary care on 05/02/2024 by Joesph Annabella HERO, FNP.  Called Nurse Triage reporting Sore Throat.  Symptoms began a week ago.  Interventions attempted: Prescription medications: hydrocone, prednisone , doxycycline .  Symptoms are: unchanged.  Triage Disposition: See PCP When Office is Open (Within 3 Days)  Patient/caregiver understands and will follow disposition?: Yes          Patient was seen on 12/2 and 12/5 for similar symptoms, she was diagnoses with Acute pharyngitis. Sore throat, swelling is resolving some. Difficulty swallowing-mild/moderate.                Copied from CRM [id number]. Topic: Clinical - Red Word Triage >> May 05, 2024  8:09 AM Cherylann RAMAN wrote: Red Word that prompted transfer to Nurse Triage: Patient called in with complaints of her neck being swollen, sore throat, and fatigue. Patient was seen on 12/05 and 12/02 for same symptoms. Reason for Disposition  [1] Sore throat is the only symptom AND [2] present > 48 hours  Answer Assessment - Initial Assessment Questions 1. ONSET: When did the throat start hurting? (Hours or days ago)      X 1 week  2. SEVERITY: How bad is the sore throat? (Scale 1-10; mild, moderate or severe)     Mild-moderate  3. STREP EXPOSURE: Has there been any exposure to strep within the past week? If Yes, ask: What type of contact occurred?      No   4.  VIRAL SYMPTOMS: Are there any symptoms of a cold, such as a runny nose, cough, hoarse voice or red eyes?      No   5. FEVER: Do you have a fever? If Yes, ask: What is your temperature, how was it measured, and when did it start?     No   6. PUS ON THE TONSILS: Is there pus on the tonsils in the back of your throat?     No   7. OTHER SYMPTOMS: Do you have any other symptoms? (e.g., difficulty breathing, headache, rash)  Red neck, where the  swelling is present.    Patient called in to triage with complaints of sore throat/ neck swelling.  This has been ongoing for x 1 week.  Patient was seen on 12/2 and 12/5 for similar symptoms, she was diagnoses with Acute pharyngitis and was advised if symptoms persisted to come back in the office. Sore throat, swelling is resolving some. Difficulty swallowing-mild/moderate.   For home care, the patient is taking hydrocone, prednisone , doxycycline .   Appointment scheduled for further evaluation; and agrees with the plan of care, and will reach out if symptoms worsen or persist.  Protocols used: Sore Throat-A-AH

## 2024-05-05 NOTE — Progress Notes (Signed)
 Subjective:  Patient ID: Sarah Chapman, female    DOB: [birth year]  Age: 63 y.o. MRN: [id number]  CC: Sore Throat (Saw [facility]  and tiffany about this. Sore throat and swollen throat. Pt reports it looked like a hard boiled egg in her neck. Swelling goes up and down and its much better today. Thyroid ? /This has happened multiple times. )   HPI  Discussed the use of AI scribe software for clinical note transcription with the patient, who gave verbal consent to proceed.  History of Present Illness Sarah Chapman is a 63 year old female who presents with neck swelling and fatigue.  She has been experiencing swelling in her neck, described as feeling like a 'hard boiled egg' on the right side, for eight days. This swelling has occurred previously, with episodes noted in the summer and March. The swelling fluctuates in size. She was started on antibiotics and increased prednisone . Tests for COVID, flu, and strep throat were negative.  She describes significant fatigue over the past six months, feeling increasingly tired and foggy. She finds it a major challenge to perform daily activities due to low energy levels. Additionally, she experiences irritability and anxiety, which have been present intermittently for the same duration.  She has a sore throat that varies in intensity, sometimes making swallowing painful, though it has not prevented her from eating or drinking. The sore throat tends to worsen when the neck swelling increases. She also reports some cough and was given cough pills during a previous visit.  On Friday night, she experienced heavy breathing and a spasm-like sensation in her left arm and shoulder, which subsided after rest. She reports shortness of breath during this episode but denies any fever.  She has a history of smoking but quit seven years ago, although she still experiences cravings.          03/19/2024   12:51 PM 02/27/2024    2:21 PM 11/19/2023    1:53 PM  Depression  screen PHQ 2/9  Decreased Interest 2 2 3   Down, Depressed, Hopeless 2 2 3   PHQ - 2 Score 4 4 6   Altered sleeping 0 2 1  Tired, decreased energy 0 3 1  Change in appetite 0 2 2  Feeling bad or failure about yourself  0 1 1  Trouble concentrating 0 2 3  Moving slowly or fidgety/restless 2 2 1   Suicidal thoughts 0 0 0  PHQ-9 Score 6  16  15    Difficult doing work/chores Not difficult at all Extremely dIfficult      Data saved with a previous flowsheet row definition    History Sarah Chapman has a past medical history of Allergic rhinitis, Back pain, Chronic headache, COPD (chronic obstructive pulmonary disease) (HCC), Emphysema of lung (HCC), Hyperlipidemia, Hypertension, Neck pain, Sleep apnea, and Vaginal Pap smear, abnormal.   She has a past surgical history that includes Neck surgery; Tonsillectomy; Cesarean section; Gynecologic cryosurgery; and Colonoscopy (N/A, 01/07/2015).   Her family history includes Bone cancer in her mother; Cancer in her maternal grandmother; Heart attack in her brother; Mental illness in her son.She reports that she quit smoking about 7 years ago. Her smoking use included cigarettes. She started smoking about 40 years ago. She has a 33 pack-year smoking history. She has never used smokeless tobacco. She reports that she does not drink alcohol and does not use drugs.    ROS Review of Systems  Constitutional:  Positive for fatigue.  HENT:  Positive for  congestion.   Eyes:  Negative for visual disturbance.  Respiratory:  Negative for shortness of breath.   Cardiovascular:  Negative for chest pain.  Gastrointestinal:  Negative for abdominal pain, constipation, diarrhea, nausea and vomiting.  Genitourinary:  Negative for difficulty urinating.  Musculoskeletal:  Positive for neck pain. Negative for arthralgias and myalgias.  Neurological:  Negative for headaches.  Psychiatric/Behavioral:  Negative for sleep disturbance.     Objective:  BP 137/80   Pulse 84   Ht  5' 5 (1.651 m)   Wt 181 lb (82.1 kg)   SpO2 97%   BMI 30.12 kg/m   BP Readings from Last 3 Encounters:  05/05/24 137/80  05/02/24 135/69  04/29/24 134/75    Wt Readings from Last 3 Encounters:  05/05/24 181 lb (82.1 kg)  05/02/24 187 lb 6.4 oz (85 kg)  04/29/24 185 lb (83.9 kg)     Physical Exam Constitutional:      General: She is not in acute distress.    Appearance: She is well-developed.  Cardiovascular:     Rate and Rhythm: Normal rate and regular rhythm.  Pulmonary:     Breath sounds: Normal breath sounds.  Musculoskeletal:        General: Normal range of motion.  Lymphadenopathy:     Head:     Left side of head: Submandibular (1 cm) adenopathy present.     Cervical: Cervical adenopathy present.     Right cervical: Superficial cervical adenopathy present.     Upper Body:     Right upper body: No supraclavicular or axillary adenopathy.     Left upper body: No supraclavicular or axillary adenopathy.  Skin:    General: Skin is warm and dry.  Neurological:     Mental Status: She is alert and oriented to person, place, and time.    Physical Exam GENERAL: Alert, cooperative, well developed, no acute distress. HEENT: Normocephalic, normal oropharynx, moist mucous membranes. NECK: Neck examined, no abnormalities noted. Thyroid  not enlarged, no palpable nodules. Swelling in neck, left side larger. CHEST: Clear to auscultation bilaterally, no wheezes, rhonchi, or crackles. CARDIOVASCULAR: Normal heart rate and rhythm, S1 and S2 normal without murmurs. ABDOMEN: Soft, non-tender, non-distended, without organomegaly, normal bowel sounds. EXTREMITIES: No cyanosis or edema. NEUROLOGICAL: Cranial nerves grossly intact, moves all extremities without gross motor or sensory deficit.  EKG: NSR, no ischemic changes  Assessment & Plan:  Mass of soft tissue of neck -     CBC with Differential/Platelet -     CMP14+EGFR -     TSH -     US  SOFT TISSUE HEAD & NECK  (NON-THYROID ); Future  Other chest pain -     CBC with Differential/Platelet -     CMP14+EGFR -     EKG 12-Lead  Fatigue, unspecified type  Other orders -     Nitroglycerin ; Place 1 tablet (0.4 mg total) under the tongue every 5 (five) minutes as needed for chest pain.  Dispense: 50 tablet; Refill: 3    Assessment and Plan Assessment & Plan Neck swelling with intermittent hard mass, left greater than right   Intermittent neck swelling with a hard mass, more pronounced on the left side, has been present for approximately eight days, with previous episodes noted in March and summer. There is no fever. The differential includes thyroid  issues, though examination suggests it is not directly connected to the thyroid . Ectopic thyroid  tissue is considered a potential cause. An ultrasound of the neck is ordered to evaluate  the mass.  Fatigue   She experiences chronic fatigue for the past six months, significantly impacting her ability to perform daily activities. It is associated with tiredness and lack of energy, with no specific cause identified yet.  Sore throat and cough   She has an intermittent sore throat and cough, with the sore throat more pronounced in the morning. There is no significant impact on swallowing or eating, and the cough is non-productive. Symptoms fluctuate with the size of the neck mass.  Chest tightness and pain, left side, with shortness of breath   Intermittent chest tightness and pain on the left side, associated with shortness of breath, occurred on Friday night, accompanied by a spasm-like sensation in the left arm and shoulder. An EKG is ordered to assess cardiac function, and blood work is ordered to evaluate for underlying causes.  Anxiety and irritability   She experiences intermittent anxiety and irritability, described as agitation and uptightness, present for the past six months and coinciding with fatigue.       Follow-up: Return in about 6 months  (around 11/03/2024), or if symptoms worsen or fail to improve.  Butler Der, M.D.

## 2024-05-05 NOTE — Telephone Encounter (Signed)
 Pt said someone called her. There is not a note in chart. She did have ultrasound today.

## 2024-05-06 ENCOUNTER — Telehealth: Payer: Self-pay | Admitting: Family Medicine

## 2024-05-06 LAB — CMP14+EGFR
ALT: 28 IU/L (ref 0–32)
AST: 17 IU/L (ref 0–40)
Albumin: 4.1 g/dL (ref 3.9–4.9)
Alkaline Phosphatase: 140 IU/L — AB (ref 49–135)
BUN/Creatinine Ratio: 20 (ref 12–28)
BUN: 17 mg/dL (ref 8–27)
Bilirubin Total: 0.2 mg/dL (ref 0.0–1.2)
CO2: 21 mmol/L (ref 20–29)
Calcium: 9.8 mg/dL (ref 8.7–10.3)
Chloride: 101 mmol/L (ref 96–106)
Creatinine, Ser: 0.86 mg/dL (ref 0.57–1.00)
Globulin, Total: 2.2 g/dL (ref 1.5–4.5)
Glucose: 112 mg/dL — AB (ref 70–99)
Potassium: 3.8 mmol/L (ref 3.5–5.2)
Sodium: 139 mmol/L (ref 134–144)
Total Protein: 6.3 g/dL (ref 6.0–8.5)
eGFR: 75 mL/min/1.73 (ref >59)

## 2024-05-06 LAB — CBC WITH DIFFERENTIAL/PLATELET
Basophils Absolute: 0.1 x10E3/uL (ref 0.0–0.2)
Basos: 1 %
EOS (ABSOLUTE): 0.1 x10E3/uL (ref 0.0–0.4)
Eos: 0 %
Hematocrit: 40.5 % (ref 34.0–46.6)
Hemoglobin: 13.3 g/dL (ref 11.1–15.9)
Immature Grans (Abs): 0.3 x10E3/uL — ABNORMAL HIGH (ref 0.0–0.1)
Immature Granulocytes: 2 %
Lymphocytes Absolute: 1.8 x10E3/uL (ref 0.7–3.1)
Lymphs: 12 %
MCH: 30.4 pg (ref 26.6–33.0)
MCHC: 32.8 g/dL (ref 31.5–35.7)
MCV: 93 fL (ref 79–97)
Monocytes Absolute: 0.6 x10E3/uL (ref 0.1–0.9)
Monocytes: 4 %
Neutrophils Absolute: 11.7 x10E3/uL — ABNORMAL HIGH (ref 1.4–7.0)
Neutrophils: 81 %
Platelets: 395 x10E3/uL (ref 150–450)
RBC: 4.38 x10E6/uL (ref 3.77–5.28)
RDW: 13 % (ref 11.7–15.4)
WBC: 14.4 x10E3/uL — ABNORMAL HIGH (ref 3.4–10.8)

## 2024-05-06 LAB — TSH: TSH: 1.33 u[IU]/mL (ref 0.450–4.500)

## 2024-05-06 NOTE — Telephone Encounter (Signed)
 I gave results to the patient for EBV and labs as well as for the US 

## 2024-05-06 NOTE — Telephone Encounter (Signed)
 Copied from CRM #[id number]. Topic: Clinical - Lab/Test Results >> May 05, 2024  4:29 PM Wess RAMAN wrote: Reason for CRM: Patient would like a call to discuss imaging results Callback #: [phone]

## 2024-05-06 NOTE — Telephone Encounter (Signed)
Noted  -LS

## 2024-05-06 NOTE — Telephone Encounter (Signed)
 Please review image for patient.

## 2024-05-07 ENCOUNTER — Other Ambulatory Visit: Payer: Self-pay | Admitting: Family Medicine

## 2024-05-07 DIAGNOSIS — R590 Localized enlarged lymph nodes: Secondary | ICD-10-CM

## 2024-05-07 DIAGNOSIS — J441 Chronic obstructive pulmonary disease with (acute) exacerbation: Secondary | ICD-10-CM

## 2024-05-14 DIAGNOSIS — Z79891 Long term (current) use of opiate analgesic: Secondary | ICD-10-CM | POA: Diagnosis not present

## 2024-05-14 DIAGNOSIS — M961 Postlaminectomy syndrome, not elsewhere classified: Secondary | ICD-10-CM | POA: Diagnosis not present

## 2024-05-14 DIAGNOSIS — M25521 Pain in right elbow: Secondary | ICD-10-CM | POA: Diagnosis not present

## 2024-05-14 DIAGNOSIS — M797 Fibromyalgia: Secondary | ICD-10-CM | POA: Diagnosis not present

## 2024-05-14 DIAGNOSIS — M47812 Spondylosis without myelopathy or radiculopathy, cervical region: Secondary | ICD-10-CM | POA: Diagnosis not present

## 2024-05-14 DIAGNOSIS — M5134 Other intervertebral disc degeneration, thoracic region: Secondary | ICD-10-CM | POA: Diagnosis not present

## 2024-05-14 DIAGNOSIS — M25511 Pain in right shoulder: Secondary | ICD-10-CM | POA: Diagnosis not present

## 2024-05-14 DIAGNOSIS — M546 Pain in thoracic spine: Secondary | ICD-10-CM | POA: Diagnosis not present

## 2024-05-14 DIAGNOSIS — G894 Chronic pain syndrome: Secondary | ICD-10-CM | POA: Diagnosis not present

## 2024-05-14 DIAGNOSIS — Z79899 Other long term (current) drug therapy: Secondary | ICD-10-CM | POA: Diagnosis not present

## 2024-05-14 DIAGNOSIS — M47814 Spondylosis without myelopathy or radiculopathy, thoracic region: Secondary | ICD-10-CM | POA: Diagnosis not present

## 2024-05-14 DIAGNOSIS — M25529 Pain in unspecified elbow: Secondary | ICD-10-CM | POA: Diagnosis not present

## 2024-05-17 ENCOUNTER — Other Ambulatory Visit: Payer: Self-pay | Admitting: Family Medicine

## 2024-05-19 ENCOUNTER — Other Ambulatory Visit: Payer: Self-pay | Admitting: Family Medicine

## 2024-05-19 ENCOUNTER — Encounter: Payer: Self-pay | Admitting: Family Medicine

## 2024-05-19 ENCOUNTER — Ambulatory Visit: Admitting: Family Medicine

## 2024-05-19 VITALS — BP 133/77 | HR 87 | Temp 98.0°F | Wt 188.0 lb

## 2024-05-19 DIAGNOSIS — E785 Hyperlipidemia, unspecified: Secondary | ICD-10-CM | POA: Diagnosis not present

## 2024-05-19 DIAGNOSIS — J432 Centrilobular emphysema: Secondary | ICD-10-CM | POA: Diagnosis not present

## 2024-05-19 DIAGNOSIS — I10 Essential (primary) hypertension: Secondary | ICD-10-CM

## 2024-05-19 DIAGNOSIS — R400 Somnolence: Secondary | ICD-10-CM

## 2024-05-19 DIAGNOSIS — M797 Fibromyalgia: Secondary | ICD-10-CM | POA: Diagnosis not present

## 2024-05-19 DIAGNOSIS — K219 Gastro-esophageal reflux disease without esophagitis: Secondary | ICD-10-CM

## 2024-05-19 MED ORDER — IPRATROPIUM-ALBUTEROL 0.5-2.5 (3) MG/3ML IN SOLN: 3.0000 mL | Freq: Four times a day (QID) | RESPIRATORY_TRACT | 3 refills | Status: AC | PRN

## 2024-05-19 MED ORDER — OLMESARTAN MEDOXOMIL 20 MG PO TABS: 20.0000 mg | ORAL_TABLET | Freq: Every day | ORAL | 1 refills | Status: AC

## 2024-05-19 MED ORDER — ALPRAZOLAM 0.5 MG PO TABS: 0.2500 mg | ORAL_TABLET | Freq: Every day | ORAL | 2 refills | Status: AC | PRN

## 2024-05-19 MED ORDER — ATORVASTATIN CALCIUM 80 MG PO TABS: 80.0000 mg | ORAL_TABLET | Freq: Every day | ORAL | 1 refills | Status: AC

## 2024-05-19 MED ORDER — ARMODAFINIL 250 MG PO TABS: 250.0000 mg | ORAL_TABLET | Freq: Every day | ORAL | 0 refills | Status: AC

## 2024-05-19 MED ORDER — TRELEGY ELLIPTA 200-62.5-25 MCG/ACT IN AEPB: 1.0000 | INHALATION_SPRAY | Freq: Every day | RESPIRATORY_TRACT | 5 refills | Status: AC

## 2024-05-19 MED ORDER — NORTRIPTYLINE HCL 75 MG PO CAPS: 75.0000 mg | ORAL_CAPSULE | Freq: Every day | ORAL | 1 refills | Status: AC

## 2024-05-19 MED ORDER — TIZANIDINE HCL 4 MG PO TABS: 2.0000 mg | ORAL_TABLET | Freq: Every day | ORAL | 3 refills | Status: AC

## 2024-05-19 MED ORDER — DEXLANSOPRAZOLE 60 MG PO CPDR: 1.0000 | DELAYED_RELEASE_CAPSULE | Freq: Every day | ORAL | 1 refills | Status: AC

## 2024-05-19 MED ORDER — AMLODIPINE BESYLATE 5 MG PO TABS: 5.0000 mg | ORAL_TABLET | Freq: Every day | ORAL | 1 refills | Status: AC

## 2024-05-19 NOTE — Progress Notes (Signed)
 "  Subjective:  Patient ID: Sarah Chapman, female    DOB: 1960/08/05  Age: 63 y.o. MRN: [id number]  CC: Medication Refill   HPI  Discussed the use of AI scribe software for clinical note transcription with the patient, who gave verbal consent to proceed.  History of Present Illness Sarah Chapman is a 63 year old female who presents with a sore throat and issues related to psychiatric medication management.  She has a sore throat, describing it as feeling rough and uncomfortable. She has not examined it herself and has not been proactive in seeking care due to feeling unwell. She mentions allergy-type congestion.  She is experiencing difficulties with psychiatric medication management since her psychiatrist stopped accepting her insurance in August. She has four remaining armodafinil  pills, which she uses sparingly, especially when she needs to drive. She is scheduled to see a sleep specialist in February but is currently without a psychiatrist. She has been on armodafinil  for many years, initially prescribed by a psychiatrist who is no longer in practice. She describes this medication as 'precious' and 'like gold mines'.  She is supposed to renew her Xanax  prescription. She states she can manage without Xanax  if necessary but prioritizes staying awake over being calm. She takes Xanax  as needed, typically not exceeding ten pills a month, and has some remaining from her last prescription in October. Her pain doctor limits her to ten Xanax  pills a month.  She is in search of a new psychiatrist and pain doctor due to her previous providers retiring or relocating. She has contacted her insurance company to obtain a list of available psychiatrists and is considering a recommendation for a psychiatrist in [place]. She expresses frustration with the difficulty in finding a new pain management provider, noting the challenges due to regulatory pressures on pain clinics.  Her current medications include  armodafinil , Xanax , Hycodan, amlodipine , atorvastatin , bupropion , Repatha , and Trelegy. She mentions she is due for a cholesterol blood test. She uses Trelegy inhaler and finds it helpful despite the need to rinse her mouth after use.          05/19/2024    3:02 PM 03/19/2024   12:51 PM 02/27/2024    2:21 PM  Depression screen PHQ 2/9  Decreased Interest 2 2 2   Down, Depressed, Hopeless 2 2 2   PHQ - 2 Score 4 4 4   Altered sleeping 2 0 2  Tired, decreased energy 2 0 3  Change in appetite 2 0 2  Feeling bad or failure about yourself  1 0 1  Trouble concentrating 2 0 2  Moving slowly or fidgety/restless 2 2 2   Suicidal thoughts 0 0 0  PHQ-9 Score 15 6  16    Difficult doing work/chores  Not difficult at all Extremely dIfficult     Data saved with a previous flowsheet row definition    History Sarah Chapman has a past medical history of Allergic rhinitis, Back pain, Chronic headache, COPD (chronic obstructive pulmonary disease) (HCC), Emphysema of lung (HCC), Hyperlipidemia, Hypertension, Neck pain, Sleep apnea, and Vaginal Pap smear, abnormal.   She has a past surgical history that includes Neck surgery; Tonsillectomy; Cesarean section; Gynecologic cryosurgery; and Colonoscopy (N/A, 01/07/2015).   Her family history includes Bone cancer in her mother; Cancer in her maternal grandmother; Heart attack in her brother; Mental illness in her son.She reports that she quit smoking about 7 years ago. Her smoking use included cigarettes. She started smoking about 40 years ago. She has a 33  pack-year smoking history. She has never used smokeless tobacco. She reports that she does not drink alcohol and does not use drugs.    ROS Review of Systems  Constitutional: Negative.   HENT:  Positive for sore throat. Negative for congestion.   Eyes:  Negative for visual disturbance.  Respiratory:  Negative for shortness of breath.   Cardiovascular:  Negative for chest pain.  Gastrointestinal:  Negative for  abdominal pain, constipation, diarrhea, nausea and vomiting.  Genitourinary:  Negative for difficulty urinating.  Musculoskeletal:  Positive for arthralgias and back pain. Negative for myalgias.  Neurological:  Negative for headaches.  Psychiatric/Behavioral:  Positive for sleep disturbance.     Objective:  BP 133/77   Pulse 87   Temp 98 F (36.7 C)   Wt 188 lb (85.3 kg)   SpO2 93%   BMI 31.28 kg/m   BP Readings from Last 3 Encounters:  05/19/24 133/77  05/05/24 137/80  05/02/24 135/69    Wt Readings from Last 3 Encounters:  05/19/24 188 lb (85.3 kg)  05/05/24 181 lb (82.1 kg)  05/02/24 187 lb 6.4 oz (85 kg)     Physical Exam Constitutional:      General: She is not in acute distress.    Appearance: She is well-developed.  HENT:     Head: Normocephalic.     Right Ear: Tympanic membrane normal.     Left Ear: Tympanic membrane normal.     Nose: No congestion or rhinorrhea.     Mouth/Throat:     Mouth: Mucous membranes are moist.     Pharynx: Oropharynx is clear. No oropharyngeal exudate or posterior oropharyngeal erythema.  Eyes:     Extraocular Movements: Extraocular movements intact.     Conjunctiva/sclera: Conjunctivae normal.     Pupils: Pupils are equal, round, and reactive to light.  Cardiovascular:     Rate and Rhythm: Normal rate and regular rhythm.  Pulmonary:     Breath sounds: Normal breath sounds.  Musculoskeletal:        General: Normal range of motion.  Skin:    General: Skin is warm and dry.  Neurological:     Mental Status: She is alert and oriented to person, place, and time.    Physical Exam    Assessment & Plan:  Hyperlipidemia, unspecified hyperlipidemia type -     Atorvastatin  Calcium ; Take 1 tablet (80 mg total) by mouth daily.  Dispense: 90 tablet; Refill: 1 -     CMP14+EGFR -     Lipid panel  Centrilobular emphysema (HCC) -     Trelegy Ellipta ; Inhale 1 puff into the lungs daily.  Dispense: 60 each; Refill: 5 -      Ipratropium-Albuterol ; Take 3 mLs by nebulization every 6 (six) hours as needed.  Dispense: 360 mL; Refill: 3  Daytime somnolence -     Armodafinil ; Take 1 tablet (250 mg total) by mouth daily.  Dispense: 15 tablet; Refill: 0 -     Armodafinil ; Take 1 tablet (250 mg total) by mouth daily.  Dispense: 30 tablet; Refill: 0  Essential hypertension -     amLODIPine  Besylate; Take 1 tablet (5 mg total) by mouth daily.  Dispense: 90 tablet; Refill: 1 -     Olmesartan  Medoxomil; Take 1 tablet (20 mg total) by mouth daily.  Dispense: 90 tablet; Refill: 1  Fibromyalgia -     ALPRAZolam ; Take 0.5-1 tablets (0.25-0.5 mg total) by mouth daily as needed.  Dispense: 10 tablet; Refill: 2 -  Nortriptyline  HCl; Take 1 capsule (75 mg total) by mouth at bedtime.  Dispense: 90 capsule; Refill: 1  Gastroesophageal reflux disease without esophagitis -     Dexlansoprazole ; Take 1 capsule (60 mg total) by mouth daily.  Dispense: 90 capsule; Refill: 1  Other orders -     tiZANidine  HCl; Take 0.5-1 tablets (2-4 mg total) by mouth at bedtime.  Dispense: 90 tablet; Refill: 3    Assessment and Plan Assessment & Plan Sleep-wake disorder   She has difficulty falling asleep, previously managed with armodafinil . Insurance issues have caused a lapse in psychiatric care, delaying access to a sleep specialist until February. A bridging supply of armodafinil  is provided until February 4th, with a post-dated prescription to last until February 6th. She is advised to contact Dr. Cherene Macintosh for psychiatric care.  Anxiety disorder   Her anxiety is managed with Xanax , taken as needed, with a current prescription allowing for 10 pills per month, aligning with pain management guidelines. A 30-day supply of Xanax  is prescribed with two refills on file.  Allergic rhinitis   She experiences a sore throat likely due to allergies, with no signs of infection, including throat irritation and congestion. An allergy pill and a  decongestant are recommended, along with gargling with salt water.  Hypertension   Her blood pressure is borderline high but not concerning. A 90-day supply of amlodipine  is prescribed.  Hyperlipidemia   Her condition is managed with atorvastatin , and a cholesterol blood test is required for monitoring. A cholesterol blood test is ordered, and a 90-day supply of atorvastatin  is prescribed.  General Health Maintenance   Routine health maintenance is discussed, including medication refills and lab work. A 90-day supply of bupropion  and a Trelegy inhaler are prescribed, and a cholesterol blood test is ordered.       Follow-up: No follow-ups on file.  Butler Der, M.D. "

## 2024-05-19 NOTE — Telephone Encounter (Signed)
 Appt scheduled for 05/19/2024

## 2024-05-19 NOTE — Telephone Encounter (Signed)
 Stacks pt NTBS 30-d given 04/15/24

## 2024-05-20 ENCOUNTER — Ambulatory Visit: Payer: Self-pay | Admitting: Family Medicine

## 2024-05-20 LAB — CMP14+EGFR
ALT: 31 IU/L (ref 0–32)
AST: 23 IU/L (ref 0–40)
Albumin: 4.3 g/dL (ref 3.9–4.9)
Alkaline Phosphatase: 113 IU/L (ref 49–135)
BUN/Creatinine Ratio: 10 — ABNORMAL LOW (ref 12–28)
BUN: 8 mg/dL (ref 8–27)
Bilirubin Total: 0.3 mg/dL (ref 0.0–1.2)
CO2: 22 mmol/L (ref 20–29)
Calcium: 9.3 mg/dL (ref 8.7–10.3)
Chloride: 103 mmol/L (ref 96–106)
Creatinine, Ser: 0.81 mg/dL (ref 0.57–1.00)
Globulin, Total: 2.3 g/dL (ref 1.5–4.5)
Glucose: 83 mg/dL (ref 70–99)
Potassium: 3.8 mmol/L (ref 3.5–5.2)
Sodium: 140 mmol/L (ref 134–144)
Total Protein: 6.6 g/dL (ref 6.0–8.5)
eGFR: 81 mL/min/1.73

## 2024-05-20 LAB — LIPID PANEL
Chol/HDL Ratio: 2.4 ratio (ref 0.0–4.4)
Cholesterol, Total: 229 mg/dL — ABNORMAL HIGH (ref 100–199)
HDL: 95 mg/dL
LDL Chol Calc (NIH): 104 mg/dL — ABNORMAL HIGH (ref 0–99)
Triglycerides: 177 mg/dL — ABNORMAL HIGH (ref 0–149)
VLDL Cholesterol Cal: 30 mg/dL (ref 5–40)

## 2024-05-21 ENCOUNTER — Telehealth: Payer: Self-pay | Admitting: *Deleted

## 2024-05-21 ENCOUNTER — Other Ambulatory Visit: Payer: Self-pay

## 2024-05-21 ENCOUNTER — Ambulatory Visit: Admitting: Gastroenterology

## 2024-05-21 ENCOUNTER — Other Ambulatory Visit (HOSPITAL_COMMUNITY)

## 2024-05-21 VITALS — BP 144/82 | HR 83 | Temp 98.6°F | Ht 65.0 in | Wt 188.6 lb

## 2024-05-21 DIAGNOSIS — R1031 Right lower quadrant pain: Secondary | ICD-10-CM

## 2024-05-21 DIAGNOSIS — K5900 Constipation, unspecified: Secondary | ICD-10-CM

## 2024-05-21 DIAGNOSIS — K219 Gastro-esophageal reflux disease without esophagitis: Secondary | ICD-10-CM | POA: Diagnosis not present

## 2024-05-21 MED ORDER — LINACLOTIDE 290 MCG PO CAPS: 290.0000 ug | ORAL_CAPSULE | Freq: Every day | ORAL | Status: AC

## 2024-05-21 NOTE — Progress Notes (Signed)
 "   Gastroenterology Office Note     Primary Care Physician:  Zollie Lowers, MD  Primary Gastroenterologist: Dr Cindie   Chief Complaint   Chief Complaint  Patient presents with   Follow-up    Pt here due to constipation. Last BM day before yesterday. States she feels like she is backed up. And follow up GERD     History of Present Illness   Sarah Chapman is a 63 y.o. female presenting today with a history of GERD and constipation, last seen a year ago. Here for routine follow-up.    Notes RLQ discomfort that is underlying, constant. Miralax every other day, which sends her to bathroom but not fully emptying. No significant improvement in discomfort after BM.  Linzess  and Amitiza  in the past, but in 2023 documentation states this was not ideal. We had discussed Motegrity. Never tried Trulance. BM about every other day. No weight loss or lack of appetite.   As teenager became really backed up.   Will sometimes feel like drier foods get hung up in esophagus. Remote EGD many years ago. Doesn't want to do EGD currently. Has had worsening GERD in last few months. Believes she is lactose intolerant. Loves milk and had been drinking lactose-free milk. Notes dietary intake worsens constipation.   Drinks coffee, pepsi, states drinks too much of this.    Colonoscopy 2016: internal hemorrhoids and diverticulosis    Past Medical History:  Diagnosis Date   Allergic rhinitis    Back pain    Chronic headache    COPD (chronic obstructive pulmonary disease) (HCC)    Emphysema of lung (HCC)    Hyperlipidemia    Hypertension    Neck pain    Sleep apnea    Vaginal Pap smear, abnormal     Past Surgical History:  Procedure Laterality Date   CESAREAN SECTION     COLONOSCOPY N/A 01/07/2015   internal hemorrhoids, diverticulosis in sigmoid colon, exam otherwise normal, no biopsies.   GYNECOLOGIC CRYOSURGERY     NECK SURGERY     TONSILLECTOMY      Current Outpatient Medications   Medication Sig Dispense Refill   albuterol  (VENTOLIN  HFA) 108 (90 Base) MCG/ACT inhaler Inhale 2 puffs into the lungs every 4 (four) hours as needed for wheezing or shortness of breath. 18 g 11   ALPRAZolam  (XANAX ) 0.5 MG tablet Take 0.5-1 tablets (0.25-0.5 mg total) by mouth daily as needed. 10 tablet 2   amLODipine  (NORVASC ) 5 MG tablet Take 1 tablet (5 mg total) by mouth daily. 90 tablet 1   [START ON 06/19/2024] Armodafinil  250 MG tablet Take 1 tablet (250 mg total) by mouth daily. 15 tablet 0   Armodafinil  250 MG tablet Take 1 tablet (250 mg total) by mouth daily. 30 tablet 0   aspirin 81 MG EC tablet Take 1 tablet by mouth daily.     atorvastatin  (LIPITOR) 80 MG tablet Take 1 tablet (80 mg total) by mouth daily. 90 tablet 1   buPROPion  HCl ER, XL, 450 MG TB24 Take 1 tablet (450 mg total) by mouth daily. 30 tablet 5   dexlansoprazole  (DEXILANT ) 60 MG capsule Take 1 capsule (60 mg total) by mouth daily. 90 capsule 1   Evolocumab  (REPATHA  SURECLICK) 140 MG/ML SOAJ Inject 140 mg into the skin every 14 (fourteen) days. 6 mL 1   ezetimibe  (ZETIA ) 10 MG tablet TAKE ONE TABLET DAILY. NEEDS TO BE SEEN FOR REFILLS 15 tablet 0   Fluticasone -Umeclidin-Vilant (TRELEGY ELLIPTA ) 200-62.5-25  MCG/ACT AEPB Inhale 1 puff into the lungs daily. 60 each 5   HYDROcodone -acetaminophen  (NORCO) 10-325 MG tablet Take 1 tablet by mouth 3 (three) times daily as needed.     ipratropium-albuterol  (DUONEB) 0.5-2.5 (3) MG/3ML SOLN Take 3 mLs by nebulization every 6 (six) hours as needed. 360 mL 3   Loratadine 10 MG CAPS 1 Once A Day Oral     naloxone (NARCAN) nasal spray 4 mg/0.1 mL      nitroGLYCERIN  (NITROSTAT ) 0.4 MG SL tablet Place 1 tablet (0.4 mg total) under the tongue every 5 (five) minutes as needed for chest pain. 50 tablet 3   nortriptyline  (PAMELOR ) 75 MG capsule Take 1 capsule (75 mg total) by mouth at bedtime. 90 capsule 1   olmesartan  (BENICAR ) 20 MG tablet Take 1 tablet (20 mg total) by mouth daily. 90  tablet 1   tiZANidine  (ZANAFLEX ) 4 MG tablet Take 0.5-1 tablets (2-4 mg total) by mouth at bedtime. 90 tablet 3   No current facility-administered medications for this visit.    Allergies as of 05/21/2024 - Review Complete 05/21/2024  Allergen Reaction Noted   Clindamycin/lincomycin Rash 11/27/2012   Penicillins  04/03/2014    Family History  Problem Relation Age of Onset   Bone cancer Mother    Cancer Maternal Grandmother    Heart attack Brother    Mental illness Son    Colon cancer Neg Hx    Colon polyps Neg Hx    Breast cancer Neg Hx     Social History   Socioeconomic History   Marital status: Divorced    Spouse name: Not on file   Number of children: Not on file   Years of education: Not on file   Highest education level: Not on file  Occupational History   Not on file  Tobacco Use   Smoking status: Former    Current packs/day: 0.00    Average packs/day: 1 pack/day for 33.0 years (33.0 ttl pk-yrs)    Types: Cigarettes    Start date: 07/26/1983    Quit date: 07/25/2016    Years since quitting: 7.8   Smokeless tobacco: Never  Vaping Use   Vaping status: Never Used  Substance and Sexual Activity   Alcohol use: No   Drug use: No   Sexual activity: Not Currently    Birth control/protection: Post-menopausal  Other Topics Concern   Not on file  Social History Narrative   Not on file   Social Drivers of Health   Tobacco Use: Medium Risk (05/19/2024)   Patient History    Smoking Tobacco Use: Former    Smokeless Tobacco Use: Never    Passive Exposure: Not on file  Financial Resource Strain: Low Risk (03/19/2024)   Overall Financial Resource Strain (CARDIA)    Difficulty of Paying Living Expenses: Not hard at all  Food Insecurity: No Food Insecurity (03/19/2024)   Epic    Worried About Radiation Protection Practitioner of Food in the Last Year: Never true    Ran Out of Food in the Last Year: Never true  Transportation Needs: No Transportation Needs (03/19/2024)   Epic    Lack  of Transportation (Medical): No    Lack of Transportation (Non-Medical): No  Physical Activity: Insufficiently Active (03/19/2024)   Exercise Vital Sign    Days of Exercise per Week: 3 days    Minutes of Exercise per Session: 30 min  Stress: No Stress Concern Present (03/19/2024)   [employer] of Occupational Health - Occupational Stress  Questionnaire    Feeling of Stress: Not at all  Social Connections: Moderately Integrated (03/19/2024)   Social Connection and Isolation Panel    Frequency of Communication with Friends and Family: More than three times a week    Frequency of Social Gatherings with Friends and Family: More than three times a week    Attends Religious Services: More than 4 times per year    Active Member of Clubs or Organizations: Yes    Attends Banker Meetings: More than 4 times per year    Marital Status: Divorced  Intimate Partner Violence: Not At Risk (03/19/2024)   Epic    Fear of Current or Ex-Partner: No    Emotionally Abused: No    Physically Abused: No    Sexually Abused: No  Depression (PHQ2-9): High Risk (05/19/2024)   Depression (PHQ2-9)    PHQ-2 Score: 15  Alcohol Screen: Low Risk (03/19/2024)   Alcohol Screen    Last Alcohol Screening Score (AUDIT): 0  Housing: Low Risk (09/11/2022)   Housing    Last Housing Risk Score: 0  Utilities: Not At Risk (03/19/2024)   Epic    Threatened with loss of utilities: No  Health Literacy: Adequate Health Literacy (03/19/2024)   B1300 Health Literacy    Frequency of need for help with medical instructions: Never     Review of Systems   Gen: Denies any fever, chills, fatigue, weight loss, lack of appetite.  CV: Denies chest pain, heart palpitations, peripheral edema, syncope.  Resp: Denies shortness of breath at rest or with exertion. Denies wheezing or cough.  GI: Denies dysphagia or odynophagia. Denies jaundice, hematemesis, fecal incontinence. GU : Denies urinary burning, urinary  frequency, urinary hesitancy MS: Denies joint pain, muscle weakness, cramps, or limitation of movement.  Derm: Denies rash, itching, dry skin Psych: Denies depression, anxiety, memory loss, and confusion Heme: Denies bruising, bleeding, and enlarged lymph nodes.   Physical Exam   BP (!) 144/82   Pulse 83   Temp 98.6 F (37 C)   Ht 5' 5 (1.651 m)   Wt 188 lb 9.6 oz (85.5 kg)   BMI 31.38 kg/m  General:   Alert and oriented. Pleasant and cooperative. Well-nourished and well-developed.  Head:  Normocephalic and atraumatic. Eyes:  Without icterus Abdomen:  +BS, soft, non-tender and non-distended. No HSM noted. No guarding or rebound. No masses appreciated.  Rectal:  Deferred  Msk:  Symmetrical without gross deformities. Normal posture. Extremities:  Without edema. Neurologic:  Alert and  oriented x4;  grossly normal neurologically. Skin:  Intact without significant lesions or rashes. Psych:  Alert and cooperative. Normal mood and affect.   Assessment/Plan:   GERD Exacerbated in light of dietary behaviors Vague dysphagia with drier foods Will continue Dexilant  daily, add Pepcid prn, GERD diet provided, EGD when able   2. Constipation Not ideally managed with Miralax Trial Linzess  290 mcg again Suspect may have underlying pelvic floor dysfunction Add Benefiber Consider Motegrity  3. RLQ abdominal pain Suspect may be r/t underlying constipation Now that this is constant and no significant improvement after BM, will order CT scan  Can pursue screening colonoscopy in 2026 or earlier if needed  3 month follow-up     Sarah MICAEL Stager, PhD, ANP-BC [facility]    "

## 2024-05-21 NOTE — Progress Notes (Signed)
 Medication Samples have been provided to the patient.  Drug name: LINZESS        Strength:        Qty: 3 BOXES  LOT: [id number]  Exp.Date: 2026/10  Dosing instructions: TAKE 1 CAP DAILY BEFORE BREAKFAST  The patient has been instructed regarding the correct time, dose, and frequency of taking this medication, including desired effects and most common side effects.   Adams Hinch I Murel Shenberger 10:52 AM 05/21/2024

## 2024-05-21 NOTE — Telephone Encounter (Signed)
 [employer] PA for CT: Authorization Number: [id number] Case Number: [id number] Review Date: 05/21/2024 10:34:24 AM Expiration Date: 11/17/2024 Status: Your case has been Approved. The prior authorization you submitted, Case [id number], has been received. Additional case status notifications will be sent if you opted in for email notifications. Thank you.

## 2024-05-21 NOTE — Patient Instructions (Addendum)
 We are arranging a CT scan in the near future due to right sided abdominal pain.  You can start taking Linzess  one capsule each morning, at least 30 minutes before your first meal of the day.  When Linzess  is taken daily as directed:  *Constipation relief is typically felt in about a week *IBS-C patients may begin to experience relief from belly pain and overall abdominal symptoms (pain, discomfort, and bloating) in about 1 week,   with symptoms typically improving over 12 weeks.  Diarrhea may occur in the first 2 weeks -keep taking it.  The diarrhea should go away and you should start having normal, complete, full bowel movements. It may be helpful to start treatment when you can be near the comfort of your own bathroom, such as a weekend.   I also recommend Benefiber for supplemental fiber, which can be found over the counter.  Let me know how the Linzess  works for you!  Continue Dexilant  daily. You can add Pepcid as needed for breakthrough. A lot of the symptoms may be coming from caffeine/carbonation, diet, etc.  We will see you in 3 months or sooner if needed  [facility]!  I enjoyed seeing you again today! I value our relationship and want to provide genuine, compassionate, and quality care. You may receive a survey regarding your visit with me, and I welcome your feedback! Thanks so much for taking the time to complete this. I look forward to seeing you again.      Therisa MICAEL Stager, PhD, ANP-BC [facility]enter[facility]

## 2024-06-04 ENCOUNTER — Other Ambulatory Visit: Payer: Self-pay | Admitting: Cardiology

## 2024-06-04 ENCOUNTER — Ambulatory Visit (HOSPITAL_COMMUNITY)
Admission: RE | Admit: 2024-06-04 | Discharge: 2024-06-04 | Disposition: A | Discharge status: Home / Self Care | Source: Ambulatory Visit | Attending: Gastroenterology | Admitting: Gastroenterology

## 2024-06-04 DIAGNOSIS — E785 Hyperlipidemia, unspecified: Secondary | ICD-10-CM

## 2024-06-04 DIAGNOSIS — R1031 Right lower quadrant pain: Secondary | ICD-10-CM | POA: Insufficient documentation

## 2024-06-04 DIAGNOSIS — I2510 Atherosclerotic heart disease of native coronary artery without angina pectoris: Secondary | ICD-10-CM

## 2024-06-04 MED ORDER — IOHEXOL 300 MG/ML  SOLN
100.0000 mL | Freq: Once | INTRAMUSCULAR | Status: AC | PRN
  Administered 2024-06-04: 100 mL via INTRAVENOUS

## 2024-06-04 NOTE — Telephone Encounter (Signed)
" °*  STAT* If patient is at the pharmacy, call can be transferred to refill team.   1. Which medications need to be refilled? (please list name of each medication and dose if known) ezetimibe  (ZETIA ) 10 MG tablet   Evolocumab  (REPATHA  SURECLICK) 140 MG/ML SOAJ   2. Which pharmacy/location (including street and city if local pharmacy) is medication to be sent to? [facility] [place], [place] - [address]   3. Do they need a 30 day or 90 day supply? 90   Patient has appt on 1/21 "

## 2024-06-05 MED ORDER — EZETIMIBE 10 MG PO TABS: 10.0000 mg | ORAL_TABLET | Freq: Every day | ORAL | 0 refills | Status: DC

## 2024-06-06 ENCOUNTER — Telehealth: Payer: Self-pay

## 2024-06-06 MED ORDER — REPATHA SURECLICK 140 MG/ML ~~LOC~~ SOAJ: 140.0000 mg | SUBCUTANEOUS | 0 refills | Status: DC

## 2024-06-06 NOTE — Telephone Encounter (Signed)
 Pt phoned and stated that the samples of Linzess  worked and she needs a Rx sent to [facility] and also pt wanted her results of her CT scan. Advised the pt that you were off today and it will be Monday before you addressed this. Pt expressed understanding

## 2024-06-10 ENCOUNTER — Ambulatory Visit: Payer: Self-pay | Admitting: Gastroenterology

## 2024-06-10 MED ORDER — LINACLOTIDE 290 MCG PO CAPS: 290.0000 ug | ORAL_CAPSULE | Freq: Every day | ORAL | 3 refills | Status: AC

## 2024-06-10 NOTE — Telephone Encounter (Signed)
 CT without any acute findings. No diverticulitis. She does have a moderate sized hiatal hernia, but this is not causing her symptoms.   I sent in Linzess  290 mcg daily to her pharmacy.

## 2024-06-10 NOTE — Addendum Note (Signed)
 Addended by: SHIRLEAN THERISA ORN on: 06/10/2024 05:32 PM   Modules accepted: Orders

## 2024-06-10 NOTE — Telephone Encounter (Signed)
 Pt phoned again today regarding the status of her CT report. Please advise

## 2024-06-11 NOTE — Telephone Encounter (Signed)
 Phoned and spoke with the pt and advised of the result of CT and Rx that was sent to her pharmacy. Pt expressed understanding. Pt was advised that if she has any issues getting the Rx to MyChart us  or call us  regarding it. Pt once again expressed understanding

## 2024-06-11 NOTE — Progress Notes (Signed)
 Noted  Pt has [place] her MyChart message from Therisa Stager, NP

## 2024-06-12 ENCOUNTER — Other Ambulatory Visit: Payer: Self-pay | Admitting: Family Medicine

## 2024-06-12 DIAGNOSIS — Z1231 Encounter for screening mammogram for malignant neoplasm of breast: Secondary | ICD-10-CM

## 2024-06-17 NOTE — Progress Notes (Unsigned)
 " Cardiology Office Note:    Date:  06/18/2024   ID:  Sarah Chapman, DOB 02/03/1961, MRN [id number]  PCP:  Zollie Lowers, MD  Cardiologist:  Lonni LITTIE Nanas, MD  Electrophysiologist:  None   Referring MD: Zollie Lowers, MD   Chief Complaint  Patient presents with   Coronary Artery Disease   History of Present Illness:    Sarah Chapman is a 64 y.o. female with a hx of hypertension, COPD, hyperlipidemia, OSA who presents for follow-up.  She was referred by Dr. Zollie for evaluation of dyspnea, initially seen on 09/24/2019.    Coronary CTA on 10/23/2019 showed calcium  score 411 (98th percentile), nonobstructive CAD with mixed plaque in proximal LAD causing 50 to 69% stenosis (CT FFR 0.84), calcified plaque in proximal circumflex causing 0 to 24% stenosis, mid next plaque in the mid circumflex causing 25 to 49% stenosis (CT FFR 0.82), mixed plaque in proximal and mid RCA causing 25 to 49% stenosis).  Lexiscan  Myoview  01/25/2022 showed normal perfusion, LVEF 40%.  Echocardiogram 02/15/2022 showed EF 60 to 65%, normal RV function, no significant valvular disease.  Since last clinic visit, she reports she is doing well.  Does report having some dyspnea.  Also reports has been having chest pain.  She describes tightness in center of chest that can last minutes.  No clear cause.  She has also been having lightheadedness, denies any syncope.  Denies any lower extremity edema.  Reports she often forgets to take Repatha .  Past Medical History:  Diagnosis Date   Allergic rhinitis    Back pain    Chronic headache    COPD (chronic obstructive pulmonary disease) (HCC)    Emphysema of lung (HCC)    Hyperlipidemia    Hypertension    Neck pain    Sleep apnea    Vaginal Pap smear, abnormal     Past Surgical History:  Procedure Laterality Date   CESAREAN SECTION     COLONOSCOPY N/A 01/07/2015   internal hemorrhoids, diverticulosis in sigmoid colon, exam otherwise normal, no biopsies.   GYNECOLOGIC  CRYOSURGERY     NECK SURGERY     TONSILLECTOMY      Current Medications: Current Meds  Medication Sig   albuterol  (VENTOLIN  HFA) 108 (90 Base) MCG/ACT inhaler Inhale 2 puffs into the lungs every 4 (four) hours as needed for wheezing or shortness of breath.   ALPRAZolam  (XANAX ) 0.5 MG tablet Take 0.5-1 tablets (0.25-0.5 mg total) by mouth daily as needed.   amLODipine  (NORVASC ) 5 MG tablet Take 1 tablet (5 mg total) by mouth daily.   [START ON 06/19/2024] Armodafinil  250 MG tablet Take 1 tablet (250 mg total) by mouth daily.   Armodafinil  250 MG tablet Take 1 tablet (250 mg total) by mouth daily.   aspirin 81 MG EC tablet Take 1 tablet by mouth daily.   atorvastatin  (LIPITOR) 80 MG tablet Take 1 tablet (80 mg total) by mouth daily.   buPROPion  HCl ER, XL, 450 MG TB24 Take 1 tablet (450 mg total) by mouth daily.   dexlansoprazole  (DEXILANT ) 60 MG capsule Take 1 capsule (60 mg total) by mouth daily.   Fluticasone -Umeclidin-Vilant (TRELEGY ELLIPTA ) 200-62.5-25 MCG/ACT AEPB Inhale 1 puff into the lungs daily.   HYDROcodone -acetaminophen  (NORCO) 10-325 MG tablet Take 1 tablet by mouth 3 (three) times daily as needed.   ipratropium-albuterol  (DUONEB) 0.5-2.5 (3) MG/3ML SOLN Take 3 mLs by nebulization every 6 (six) hours as needed.   linaclotide  (LINZESS ) 290 MCG CAPS capsule  Take 1 capsule (290 mcg total) by mouth daily before breakfast.   linaclotide  (LINZESS ) 290 MCG CAPS capsule Take 1 capsule (290 mcg total) by mouth daily before breakfast.   Loratadine 10 MG CAPS 1 Once A Day Oral   naloxone (NARCAN) nasal spray 4 mg/0.1 mL    nitroGLYCERIN  (NITROSTAT ) 0.4 MG SL tablet Place 1 tablet (0.4 mg total) under the tongue every 5 (five) minutes as needed for chest pain.   nortriptyline  (PAMELOR ) 75 MG capsule Take 1 capsule (75 mg total) by mouth at bedtime.   olmesartan  (BENICAR ) 20 MG tablet Take 1 tablet (20 mg total) by mouth daily.   tiZANidine  (ZANAFLEX ) 4 MG tablet Take 0.5-1 tablets (2-4 mg  total) by mouth at bedtime.   [DISCONTINUED] Evolocumab  (REPATHA  SURECLICK) 140 MG/ML SOAJ Inject 140 mg into the skin every 14 (fourteen) days.   [DISCONTINUED] ezetimibe  (ZETIA ) 10 MG tablet Take 1 tablet (10 mg total) by mouth daily.     Allergies:   Clindamycin/lincomycin and Penicillins   Social History   Socioeconomic History   Marital status: Divorced    Spouse name: Not on file   Number of children: Not on file   Years of education: Not on file   Highest education level: Not on file  Occupational History   Not on file  Tobacco Use   Smoking status: Former    Current packs/day: 0.00    Average packs/day: 1 pack/day for 33.0 years (33.0 ttl pk-yrs)    Types: Cigarettes    Start date: 07/26/1983    Quit date: 07/25/2016    Years since quitting: 7.9   Smokeless tobacco: Never  Vaping Use   Vaping status: Never Used  Substance and Sexual Activity   Alcohol use: No   Drug use: No   Sexual activity: Not Currently    Birth control/protection: Post-menopausal  Other Topics Concern   Not on file  Social History Narrative   Not on file   Social Drivers of Health   Tobacco Use: Medium Risk (05/19/2024)   Patient History    Smoking Tobacco Use: Former    Smokeless Tobacco Use: Never    Passive Exposure: Not on file  Financial Resource Strain: Low Risk (03/19/2024)   Overall Financial Resource Strain (CARDIA)    Difficulty of Paying Living Expenses: Not hard at all  Food Insecurity: No Food Insecurity (03/19/2024)   Epic    Worried About Radiation Protection Practitioner of Food in the Last Year: Never true    Ran Out of Food in the Last Year: Never true  Transportation Needs: No Transportation Needs (03/19/2024)   Epic    Lack of Transportation (Medical): No    Lack of Transportation (Non-Medical): No  Physical Activity: Insufficiently Active (03/19/2024)   Exercise Vital Sign    Days of Exercise per Week: 3 days    Minutes of Exercise per Session: 30 min  Stress: No Stress Concern  Present (03/19/2024)   [employer] of Occupational Health - Occupational Stress Questionnaire    Feeling of Stress: Not at all  Social Connections: Moderately Integrated (03/19/2024)   Social Connection and Isolation Panel    Frequency of Communication with Friends and Family: More than three times a week    Frequency of Social Gatherings with Friends and Family: More than three times a week    Attends Religious Services: More than 4 times per year    Active Member of [employer] or Organizations: Yes    Attends Banker  Meetings: More than 4 times per year    Marital Status: Divorced  Depression (PHQ2-9): High Risk (05/19/2024)   Depression (PHQ2-9)    PHQ-2 Score: 15  Alcohol Screen: Low Risk (03/19/2024)   Alcohol Screen    Last Alcohol Screening Score (AUDIT): 0  Housing: Low Risk (09/11/2022)   Housing    Last Housing Risk Score: 0  Utilities: Not At Risk (03/19/2024)   Epic    Threatened with loss of utilities: No  Health Literacy: Adequate Health Literacy (03/19/2024)   B1300 Health Literacy    Frequency of need for help with medical instructions: Never     Family History: The patient's family history includes Bone cancer in her mother; Cancer in her maternal grandmother; Heart attack in her brother; Mental illness in her son. There is no history of Colon cancer, Colon polyps, or Breast cancer.  ROS:   Please see the history of present illness.     All other systems reviewed and are negative.  EKGs/Labs/Other Studies Reviewed:    The following studies were reviewed today:   EKG:  EKG is ordered today.  The ekg ordered demonstrates normal sinus rhythm, rate 75, no ST/T abnormalities  Recent Labs: 05/05/2024: Hemoglobin 13.3; Platelets 395; TSH 1.330 05/19/2024: ALT 31; BUN 8; Creatinine, Ser 0.81; Potassium 3.8; Sodium 140  Recent Lipid Panel    Component Value Date/Time   CHOL 229 (H) 05/19/2024 1540   TRIG 177 (H) 05/19/2024 1540   HDL 95 05/19/2024  1540   CHOLHDL 2.4 05/19/2024 1540   LDLCALC 104 (H) 05/19/2024 1540    Physical Exam:    VS:  BP 117/73 (BP Location: Left Arm, Patient Position: Sitting, Cuff Size: Normal)   Pulse 89   Ht 5' 6 (1.676 m)   Wt 183 lb 6.4 oz (83.2 kg)   BMI 29.60 kg/m     Wt Readings from Last 3 Encounters:  06/18/24 183 lb 6.4 oz (83.2 kg)  05/21/24 188 lb 9.6 oz (85.5 kg)  05/19/24 188 lb (85.3 kg)     GEN:  in no acute distress HEENT: Normal NECK: No JVD CARDIAC: RRR, no murmurs, rubs, gallops RESPIRATORY:  Clear to auscultation without rales, wheezing or rhonchi  ABDOMEN: Soft, non-tender, non-distended MUSCULOSKELETAL:  No edema; No deformity  SKIN: Warm and dry NEUROLOGIC:  Alert and oriented x 3 PSYCHIATRIC:  Normal affect   ASSESSMENT:    1. Coronary artery disease involving native coronary artery of native heart, unspecified whether angina present   2. Chest pain of uncertain etiology   3. Essential hypertension   4. Hyperlipidemia, unspecified hyperlipidemia type   5. Bilateral carotid artery stenosis   6. Precordial pain      PLAN:     CAD: Presented with atypical chest pain.  Coronary CTA on 10/23/2019 showed calcium  score 411 (98th percentile), nonobstructive CAD with mixed plaque in proximal LAD causing 50 to 69% stenosis (CT FFR 0.84), calcified plaque in proximal circumflex causing 0 to 24% stenosis, mid next plaque in the mid circumflex causing 25 to 49% stenosis (CT FFR 0.82), mixed plaque in proximal and mid RCA causing 25 to 49% stenosis).  Lexiscan  Myoview  01/25/2022 showed normal perfusion, LVEF 40%.  Echocardiogram 02/15/2022 showed EF 60 to 65%, normal RV function, no significant valvular disease. -Continue aspirin 81 mg -Continue atorvastatin  80 mg daily and Zetia  10 mg daily and Repatha  - Reporting atypical chest pain, recommend stress PET to evaluate for ischemia.  Not a treadmill candidate  Hyperlipidemia:  On atorvastatin  80 mg daily and Zetia  10 mg daily  and Repatha .  LDL increased from 38 on 08/2023 to 105 on 04/2024, reports she often forgets to take her Repatha .  Encouraged compliance.  Hypertension: On amlodipine  5 mg daily and olmesartan  20 mg daily.  Appears controlled  Carotid stenosis: Carotid duplex in 01/2019 showed right ICA less than 60% stenosis, left ICA 60 to 79%.  Asymptomatic.  Was seeing [facility] at [facility].  Repeat duplex 02/12/2020 shows 60 to 79% stenosis in bilateral ICA.  Duplex 04/01/2021 showed stable 60 to 79% stenosis in bilateral ICA.  Duplex 03/06/2023 shows bilateral 60 to 79% stenosis.  Continue aspirin, statin.  Update carotid duplex  Leg pain: Normal ABIs 03/31/2020  OSA: on CPAP, reports compliance.  RTC in 6 months  Informed Consent   Shared Decision Making/Informed Consent The risks [chest pain, shortness of breath, cardiac arrhythmias, dizziness, blood pressure fluctuations, myocardial infarction, stroke/transient ischemic attack, nausea, vomiting, allergic reaction, radiation exposure, metallic taste sensation and life-threatening complications (estimated to be 1 in 10,000)], benefits (risk stratification, diagnosing coronary artery disease, treatment guidance) and alternatives of a cardiac PET stress test were discussed in detail with Sarah Chapman and she agrees to proceed.       Medication Adjustments/Labs and Tests Ordered: Current medicines are reviewed at length with the patient today.  Concerns regarding medicines are outlined above.  Orders Placed This Encounter  Procedures   NM PET CT CARDIAC PERFUSION MULTI W/ABSOLUTE BLOODFLOW   Meds ordered this encounter  Medications   ezetimibe  (ZETIA ) 10 MG tablet    Sig: Take 1 tablet (10 mg total) by mouth daily.    Dispense:  90 tablet    Refill:  3   Evolocumab  (REPATHA  SURECLICK) 140 MG/ML SOAJ    Sig: Inject 140 mg into the skin every 14 (fourteen) days.    Dispense:  6 mL    Refill:  3    Patient Instructions  Medication Instructions:   Your physician recommends that you continue on your current medications as directed. Please refer to the Current Medication list given to you today.  *If you need a refill on your cardiac medications before your next appointment, please call your pharmacy*  Lab Work: none If you have labs (blood work) drawn today and your tests are completely normal, you will receive your results only by: MyChart Message (if you have MyChart) OR A paper copy in the mail If you have any lab test that is abnormal or we need to change your treatment, we will call you to review the results.  Testing/Procedures:    Please report to Radiology at the [facility] Main Entrance 30 minutes early for your test.  [address] [place], [place] [address]                        How to Prepare for Your Cardiac PET/CT Stress Test:  Nothing to eat or drink, except water, 3 hours prior to arrival time.  NO caffeine/decaffeinated products, or chocolate 12 hours prior to arrival. (Please note decaffeinated beverages (teas/coffees) still contain caffeine).  If you have caffeine within 12 hours prior, the test will need to be rescheduled.  Medication instructions: Do not take erectile dysfunction medications for 72 hours prior to test (sildenafil, tadalafil) Do not take nitrates (isosorbide mononitrate, Ranexa) the day before or day of test Do not take tamsulosin the day before or morning of test Hold theophylline  containing medications for 12 hours. Hold Dipyridamole 48 hours prior to the test.  You may take your remaining medications with water.  NO perfume, cologne or lotion on chest or abdomen area. FEMALES - Please avoid wearing dresses to this appointment.  Total time is 1 to 2 hours; you may want to bring reading material for the waiting time.  IF YOU THINK YOU MAY BE PREGNANT, OR ARE NURSING PLEASE INFORM THE TECHNOLOGIST.  In preparation for your appointment, medication and supplies will be  purchased.  Appointment availability is limited, so if you need to cancel or reschedule, please call the Radiology Department Scheduler at [phone] 24 hours in advance to avoid a cancellation fee of $100.00  What to Expect When you Arrive:  Once you arrive and check in for your appointment, you will be taken to a preparation room within the Radiology Department.  A technologist or Nurse will obtain your medical history, verify that you are correctly prepped for the exam, and explain the procedure.  Afterwards, an IV will be started in your arm and electrodes will be placed on your skin for EKG monitoring during the stress portion of the exam. Then you will be escorted to the PET/CT scanner.  There, staff will get you positioned on the scanner and obtain a blood pressure and EKG.  During the exam, you will continue to be connected to the EKG and blood pressure machines.  A small, safe amount of a radioactive tracer will be injected in your IV to obtain a series of pictures of your heart along with an injection of a stress agent.    After your Exam:  It is recommended that you eat a meal and drink a caffeinated beverage to counter act any effects of the stress agent.  Drink plenty of fluids for the remainder of the day and urinate frequently for the first couple of hours after the exam.  Your doctor will inform you of your test results within 7-10 business days.  For more information and frequently asked questions, please visit our website: [email or web address]  For questions about your test or how to prepare for your test, please call: Cardiac Imaging Nurse Navigators Office: [phone]   Follow-Up: At [facility], you and your health needs are our priority.  As part of our continuing mission to provide you with exceptional heart care, our providers are all part of one team.  This team includes your primary Cardiologist (physician) and Advanced Practice Providers or  APPs (Physician Assistants and Nurse Practitioners) who all work together to provide you with the care you need, when you need it.  Your next appointment:   3 months  Provider:   Dr. Kate  We recommend signing up for the patient portal called MyChart.  Sign up information is provided on this After Visit Summary.  MyChart is used to connect with patients for Virtual Visits (Telemedicine).  Patients are able to view lab/test results, encounter notes, upcoming appointments, etc.  Non-urgent messages can be sent to your provider as well.   To learn more about what you can do with MyChart, go to [email or web address].   Other Instructions Your physician has requested that you have a carotid duplex. This test is an ultrasound of the carotid arteries in your neck. It looks at blood flow through these arteries that supply the brain with blood. Allow one hour for this exam. There are no restrictions or special instructions.  Signed, Lonni LITTIE Nanas, MD  06/18/2024 4:13 PM    [facility] "

## 2024-06-18 ENCOUNTER — Ambulatory Visit: Attending: Cardiology | Admitting: Cardiology

## 2024-06-18 ENCOUNTER — Ambulatory Visit
Admission: RE | Admit: 2024-06-18 | Discharge: 2024-06-18 | Disposition: A | Source: Ambulatory Visit | Attending: Family Medicine | Admitting: Family Medicine

## 2024-06-18 ENCOUNTER — Other Ambulatory Visit: Payer: Self-pay | Admitting: *Deleted

## 2024-06-18 VITALS — BP 117/73 | HR 89 | Ht 66.0 in | Wt 183.4 lb

## 2024-06-18 DIAGNOSIS — I6523 Occlusion and stenosis of bilateral carotid arteries: Secondary | ICD-10-CM | POA: Diagnosis not present

## 2024-06-18 DIAGNOSIS — E785 Hyperlipidemia, unspecified: Secondary | ICD-10-CM | POA: Diagnosis not present

## 2024-06-18 DIAGNOSIS — I2510 Atherosclerotic heart disease of native coronary artery without angina pectoris: Secondary | ICD-10-CM

## 2024-06-18 DIAGNOSIS — I10 Essential (primary) hypertension: Secondary | ICD-10-CM

## 2024-06-18 DIAGNOSIS — Z1231 Encounter for screening mammogram for malignant neoplasm of breast: Secondary | ICD-10-CM

## 2024-06-18 DIAGNOSIS — R079 Chest pain, unspecified: Secondary | ICD-10-CM

## 2024-06-18 DIAGNOSIS — R072 Precordial pain: Secondary | ICD-10-CM

## 2024-06-18 MED ORDER — REPATHA SURECLICK 140 MG/ML ~~LOC~~ SOAJ: 140.0000 mg | SUBCUTANEOUS | 3 refills | Status: AC

## 2024-06-18 MED ORDER — EZETIMIBE 10 MG PO TABS: 10.0000 mg | ORAL_TABLET | Freq: Every day | ORAL | 3 refills | Status: AC

## 2024-06-18 NOTE — Patient Instructions (Addendum)
 Medication Instructions:  Your physician recommends that you continue on your current medications as directed. Please refer to the Current Medication list given to you today.  *If you need a refill on your cardiac medications before your next appointment, please call your pharmacy*  Lab Work: none If you have labs (blood work) drawn today and your tests are completely normal, you will receive your results only by: MyChart Message (if you have MyChart) OR A paper copy in the mail If you have any lab test that is abnormal or we need to change your treatment, we will call you to review the results.  Testing/Procedures:    Please report to Radiology at the [facility] Main Entrance 30 minutes early for your test.  [address] [place], [place] [address]                        How to Prepare for Your Cardiac PET/CT Stress Test:  Nothing to eat or drink, except water, 3 hours prior to arrival time.  NO caffeine/decaffeinated products, or chocolate 12 hours prior to arrival. (Please note decaffeinated beverages (teas/coffees) still contain caffeine).  If you have caffeine within 12 hours prior, the test will need to be rescheduled.  Medication instructions: Do not take erectile dysfunction medications for 72 hours prior to test (sildenafil, tadalafil) Do not take nitrates (isosorbide mononitrate, Ranexa) the day before or day of test Do not take tamsulosin the day before or morning of test Hold theophylline containing medications for 12 hours. Hold Dipyridamole 48 hours prior to the test.  You may take your remaining medications with water.  NO perfume, cologne or lotion on chest or abdomen area. FEMALES - Please avoid wearing dresses to this appointment.  Total time is 1 to 2 hours; you may want to bring reading material for the waiting time.  IF YOU THINK YOU MAY BE PREGNANT, OR ARE NURSING PLEASE INFORM THE TECHNOLOGIST.  In preparation for your appointment,  medication and supplies will be purchased.  Appointment availability is limited, so if you need to cancel or reschedule, please call the Radiology Department Scheduler at [phone] 24 hours in advance to avoid a cancellation fee of $100.00  What to Expect When you Arrive:  Once you arrive and check in for your appointment, you will be taken to a preparation room within the Radiology Department.  A technologist or Nurse will obtain your medical history, verify that you are correctly prepped for the exam, and explain the procedure.  Afterwards, an IV will be started in your arm and electrodes will be placed on your skin for EKG monitoring during the stress portion of the exam. Then you will be escorted to the PET/CT scanner.  There, staff will get you positioned on the scanner and obtain a blood pressure and EKG.  During the exam, you will continue to be connected to the EKG and blood pressure machines.  A small, safe amount of a radioactive tracer will be injected in your IV to obtain a series of pictures of your heart along with an injection of a stress agent.    After your Exam:  It is recommended that you eat a meal and drink a caffeinated beverage to counter act any effects of the stress agent.  Drink plenty of fluids for the remainder of the day and urinate frequently for the first couple of hours after the exam.  Your doctor will inform you of your test results  within 7-10 business days.  For more information and frequently asked questions, please visit our website: [email or web address]  For questions about your test or how to prepare for your test, please call: Cardiac Imaging Nurse Navigators Office: [phone]   Follow-Up: At [facility], you and your health needs are our priority.  As part of our continuing mission to provide you with exceptional heart care, our providers are all part of one team.  This team includes your primary Cardiologist (physician) and  Advanced Practice Providers or APPs (Physician Assistants and Nurse Practitioners) who all work together to provide you with the care you need, when you need it.  Your next appointment:   3 months  Provider:   Dr. Kate  We recommend signing up for the patient portal called MyChart.  Sign up information is provided on this After Visit Summary.  MyChart is used to connect with patients for Virtual Visits (Telemedicine).  Patients are able to view lab/test results, encounter notes, upcoming appointments, etc.  Non-urgent messages can be sent to your provider as well.   To learn more about what you can do with MyChart, go to [email or web address].   Other Instructions Your physician has requested that you have a carotid duplex. This test is an ultrasound of the carotid arteries in your neck. It looks at blood flow through these arteries that supply the brain with blood. Allow one hour for this exam. There are no restrictions or special instructions.

## 2024-06-19 ENCOUNTER — Other Ambulatory Visit: Payer: Self-pay | Admitting: *Deleted

## 2024-06-19 DIAGNOSIS — I6523 Occlusion and stenosis of bilateral carotid arteries: Secondary | ICD-10-CM

## 2024-06-19 NOTE — Progress Notes (Signed)
 Order place to be done in [facility]. Patient request to be done at [facility] and was made aware procedure would be done at [facility]

## 2024-06-26 ENCOUNTER — Encounter: Admitting: Nurse Practitioner

## 2024-07-08 ENCOUNTER — Encounter: Admitting: Family

## 2024-07-09 ENCOUNTER — Ambulatory Visit

## 2024-07-22 ENCOUNTER — Encounter: Admitting: Family Medicine

## 2024-07-23 ENCOUNTER — Ambulatory Visit (HOSPITAL_COMMUNITY)

## 2024-08-19 ENCOUNTER — Ambulatory Visit: Admitting: Gastroenterology

## 2024-09-22 ENCOUNTER — Ambulatory Visit: Admitting: Cardiology

## 2025-03-20 ENCOUNTER — Ambulatory Visit
